# Patient Record
Sex: Female | Born: 1955
Health system: Southern US, Community
[De-identification: ages and names within clinical notes are randomized; demographics above are authoritative.]

## PROBLEM LIST (undated history)

## (undated) DIAGNOSIS — L570 Actinic keratosis: Secondary | ICD-10-CM

## (undated) DIAGNOSIS — Z9889 Other specified postprocedural states: Secondary | ICD-10-CM

## (undated) DIAGNOSIS — Z85828 Personal history of other malignant neoplasm of skin: Secondary | ICD-10-CM

## (undated) DIAGNOSIS — R112 Nausea with vomiting, unspecified: Secondary | ICD-10-CM

## (undated) DIAGNOSIS — K5790 Diverticulosis of intestine, part unspecified, without perforation or abscess without bleeding: Secondary | ICD-10-CM

## (undated) DIAGNOSIS — Z8719 Personal history of other diseases of the digestive system: Secondary | ICD-10-CM

## (undated) DIAGNOSIS — K279 Peptic ulcer, site unspecified, unspecified as acute or chronic, without hemorrhage or perforation: Secondary | ICD-10-CM

## (undated) DIAGNOSIS — G4730 Sleep apnea, unspecified: Secondary | ICD-10-CM

## (undated) DIAGNOSIS — M1990 Unspecified osteoarthritis, unspecified site: Secondary | ICD-10-CM

## (undated) DIAGNOSIS — I10 Essential (primary) hypertension: Secondary | ICD-10-CM

## (undated) DIAGNOSIS — K219 Gastro-esophageal reflux disease without esophagitis: Secondary | ICD-10-CM

## (undated) HISTORY — DX: Actinic keratosis: L57.0

## (undated) HISTORY — DX: Essential (primary) hypertension: I10

## (undated) HISTORY — DX: Diverticulosis of intestine, part unspecified, without perforation or abscess without bleeding: K57.90

## (undated) HISTORY — DX: Peptic ulcer, site unspecified, unspecified as acute or chronic, without hemorrhage or perforation: K27.9

## (undated) HISTORY — DX: Personal history of other malignant neoplasm of skin: Z85.828

## (undated) HISTORY — DX: Personal history of other diseases of the digestive system: Z87.19

## (undated) HISTORY — PX: OTHER SURGICAL HISTORY: SHX169

---
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
## 1989-07-17 DIAGNOSIS — K279 Peptic ulcer, site unspecified, unspecified as acute or chronic, without hemorrhage or perforation: Secondary | ICD-10-CM

## 1989-07-17 HISTORY — DX: Peptic ulcer, site unspecified, unspecified as acute or chronic, without hemorrhage or perforation: K27.9

## 1990-03-17 HISTORY — PX: CHOLECYSTECTOMY: SHX55

## 1997-12-09 ENCOUNTER — Other Ambulatory Visit: Admission: RE | Admit: 1997-12-09 | Discharge: 1997-12-09 | Payer: Self-pay | Admitting: Gynecology

## 1998-12-23 ENCOUNTER — Other Ambulatory Visit: Admission: RE | Admit: 1998-12-23 | Discharge: 1998-12-23 | Payer: Self-pay | Admitting: Gynecology

## 2000-01-23 ENCOUNTER — Other Ambulatory Visit: Admission: RE | Admit: 2000-01-23 | Discharge: 2000-01-23 | Payer: Self-pay | Admitting: Gynecology

## 2001-03-07 ENCOUNTER — Other Ambulatory Visit: Admission: RE | Admit: 2001-03-07 | Discharge: 2001-03-07 | Payer: Self-pay | Admitting: Gynecology

## 2002-06-02 ENCOUNTER — Other Ambulatory Visit: Admission: RE | Admit: 2002-06-02 | Discharge: 2002-06-02 | Payer: Self-pay | Admitting: Gynecology

## 2003-07-20 ENCOUNTER — Other Ambulatory Visit: Admission: RE | Admit: 2003-07-20 | Discharge: 2003-07-20 | Payer: Self-pay | Admitting: Gynecology

## 2004-07-25 ENCOUNTER — Other Ambulatory Visit: Admission: RE | Admit: 2004-07-25 | Discharge: 2004-07-25 | Payer: Self-pay | Admitting: Gynecology

## 2005-10-05 ENCOUNTER — Other Ambulatory Visit: Admission: RE | Admit: 2005-10-05 | Discharge: 2005-10-05 | Payer: Self-pay | Admitting: Gynecology

## 2006-06-13 ENCOUNTER — Ambulatory Visit: Payer: Self-pay

## 2006-11-15 ENCOUNTER — Ambulatory Visit: Payer: Self-pay | Admitting: Gastroenterology

## 2006-11-30 ENCOUNTER — Encounter: Payer: Self-pay | Admitting: Gastroenterology

## 2006-11-30 ENCOUNTER — Ambulatory Visit: Payer: Self-pay | Admitting: Gastroenterology

## 2007-05-07 DIAGNOSIS — C4492 Squamous cell carcinoma of skin, unspecified: Secondary | ICD-10-CM

## 2007-05-07 HISTORY — DX: Squamous cell carcinoma of skin, unspecified: C44.92

## 2008-07-17 HISTORY — PX: PARTIAL COLECTOMY: SHX5273

## 2008-10-16 ENCOUNTER — Encounter: Admission: RE | Admit: 2008-10-16 | Discharge: 2008-10-16 | Payer: Self-pay | Admitting: Obstetrics and Gynecology

## 2008-10-16 ENCOUNTER — Inpatient Hospital Stay (HOSPITAL_COMMUNITY): Admission: EM | Admit: 2008-10-16 | Discharge: 2008-10-21 | Payer: Self-pay | Admitting: Emergency Medicine

## 2008-10-17 ENCOUNTER — Ambulatory Visit: Payer: Self-pay | Admitting: Gastroenterology

## 2008-11-13 ENCOUNTER — Encounter: Payer: Self-pay | Admitting: Gastroenterology

## 2008-11-26 ENCOUNTER — Encounter: Admission: RE | Admit: 2008-11-26 | Discharge: 2008-11-26 | Payer: Self-pay | Admitting: Surgery

## 2008-12-22 ENCOUNTER — Encounter: Admission: RE | Admit: 2008-12-22 | Discharge: 2008-12-22 | Payer: Self-pay | Admitting: Surgery

## 2009-01-28 ENCOUNTER — Encounter: Payer: Self-pay | Admitting: Internal Medicine

## 2009-01-28 DIAGNOSIS — K5730 Diverticulosis of large intestine without perforation or abscess without bleeding: Secondary | ICD-10-CM | POA: Insufficient documentation

## 2009-02-01 ENCOUNTER — Ambulatory Visit: Payer: Self-pay | Admitting: Internal Medicine

## 2009-02-02 ENCOUNTER — Inpatient Hospital Stay (HOSPITAL_COMMUNITY): Admission: RE | Admit: 2009-02-02 | Discharge: 2009-02-06 | Payer: Self-pay | Admitting: Surgery

## 2009-02-02 ENCOUNTER — Encounter (INDEPENDENT_AMBULATORY_CARE_PROVIDER_SITE_OTHER): Payer: Self-pay | Admitting: Surgery

## 2009-09-27 DIAGNOSIS — D239 Other benign neoplasm of skin, unspecified: Secondary | ICD-10-CM

## 2009-09-27 HISTORY — DX: Other benign neoplasm of skin, unspecified: D23.9

## 2010-10-23 LAB — CBC
HCT: 30.5 % — ABNORMAL LOW (ref 36.0–46.0)
HCT: 33.6 % — ABNORMAL LOW (ref 36.0–46.0)
MCHC: 34.6 g/dL (ref 30.0–36.0)
MCV: 87.1 fL (ref 78.0–100.0)
MCV: 88 fL (ref 78.0–100.0)
Platelets: 179 10*3/uL (ref 150–400)
Platelets: 205 10*3/uL (ref 150–400)
Platelets: 208 10*3/uL (ref 150–400)
RBC: 3.5 MIL/uL — ABNORMAL LOW (ref 3.87–5.11)
RBC: 3.82 MIL/uL — ABNORMAL LOW (ref 3.87–5.11)
RDW: 15.1 % (ref 11.5–15.5)
WBC: 4.5 10*3/uL (ref 4.0–10.5)
WBC: 5.3 10*3/uL (ref 4.0–10.5)
WBC: 9 10*3/uL (ref 4.0–10.5)

## 2010-10-23 LAB — BASIC METABOLIC PANEL
BUN: 11 mg/dL (ref 6–23)
BUN: 3 mg/dL — ABNORMAL LOW (ref 6–23)
CO2: 27 mEq/L (ref 19–32)
Calcium: 9.3 mg/dL (ref 8.4–10.5)
Chloride: 102 mEq/L (ref 96–112)
Chloride: 105 mEq/L (ref 96–112)
Creatinine, Ser: 0.67 mg/dL (ref 0.4–1.2)
Creatinine, Ser: 0.69 mg/dL (ref 0.4–1.2)
GFR calc Af Amer: >60 mL/min (ref >60)
GFR calc non Af Amer: >60 mL/min (ref >60)
GFR calc non Af Amer: >60 mL/min (ref >60)
Glucose, Bld: 55 mg/dL — ABNORMAL LOW (ref 70–99)
Potassium: 3.3 mEq/L — ABNORMAL LOW (ref 3.5–5.1)
Potassium: 3.3 mEq/L — ABNORMAL LOW (ref 3.5–5.1)
Potassium: 3.6 mEq/L (ref 3.5–5.1)
Sodium: 135 mEq/L (ref 135–145)

## 2010-10-26 LAB — COMPREHENSIVE METABOLIC PANEL
AST: 23 U/L (ref 0–37)
Albumin: 2.2 g/dL — ABNORMAL LOW (ref 3.5–5.2)
Albumin: 2.8 g/dL — ABNORMAL LOW (ref 3.5–5.2)
Alkaline Phosphatase: 87 U/L (ref 39–117)
BUN: 7 mg/dL (ref 6–23)
BUN: 9 mg/dL (ref 6–23)
CO2: 24 mEq/L (ref 19–32)
Chloride: 105 mEq/L (ref 96–112)
Creatinine, Ser: 0.85 mg/dL (ref 0.4–1.2)
GFR calc Af Amer: >60 mL/min (ref >60)
GFR calc non Af Amer: >60 mL/min (ref >60)
Potassium: 4.1 mEq/L (ref 3.5–5.1)
Total Bilirubin: 0.8 mg/dL (ref 0.3–1.2)
Total Protein: 6.5 g/dL (ref 6.0–8.3)

## 2010-10-26 LAB — CBC
HCT: 29.5 % — ABNORMAL LOW (ref 36.0–46.0)
HCT: 30.6 % — ABNORMAL LOW (ref 36.0–46.0)
HCT: 34.8 % — ABNORMAL LOW (ref 36.0–46.0)
Hemoglobin: 10.4 g/dL — ABNORMAL LOW (ref 12.0–15.0)
MCV: 87.3 fL (ref 78.0–100.0)
MCV: 87.6 fL (ref 78.0–100.0)
Platelets: 363 10*3/uL (ref 150–400)
Platelets: 363 10*3/uL (ref 150–400)
Platelets: 433 10*3/uL — ABNORMAL HIGH (ref 150–400)
RBC: 3.52 MIL/uL — ABNORMAL LOW (ref 3.87–5.11)
RDW: 12.9 % (ref 11.5–15.5)
RDW: 13.3 % (ref 11.5–15.5)
WBC: 10.6 10*3/uL — ABNORMAL HIGH (ref 4.0–10.5)
WBC: 15.8 10*3/uL — ABNORMAL HIGH (ref 4.0–10.5)
WBC: 6 10*3/uL (ref 4.0–10.5)

## 2010-10-26 LAB — DIFFERENTIAL
Basophils Absolute: 0 10*3/uL (ref 0.0–0.1)
Eosinophils Relative: 0 % (ref 0–5)
Lymphocytes Relative: 12 % (ref 12–46)
Lymphs Abs: 1.8 10*3/uL (ref 0.7–4.0)
Monocytes Absolute: 1.2 10*3/uL — ABNORMAL HIGH (ref 0.1–1.0)
Monocytes Relative: 7 % (ref 3–12)
Neutro Abs: 12.8 10*3/uL — ABNORMAL HIGH (ref 1.7–7.7)

## 2010-10-26 LAB — BASIC METABOLIC PANEL
BUN: 4 mg/dL — ABNORMAL LOW (ref 6–23)
Creatinine, Ser: 0.71 mg/dL (ref 0.4–1.2)
GFR calc non Af Amer: >60 mL/min (ref >60)
Glucose, Bld: 126 mg/dL — ABNORMAL HIGH (ref 70–99)
Potassium: 4.2 mEq/L (ref 3.5–5.1)

## 2010-10-26 LAB — URINE CULTURE: Colony Count: NO GROWTH

## 2010-10-26 LAB — CULTURE, ROUTINE-ABSCESS

## 2010-10-26 LAB — PHOSPHORUS: Phosphorus: 3 mg/dL (ref 2.3–4.6)

## 2010-10-26 LAB — APTT: aPTT: 37 seconds (ref 24–37)

## 2010-11-29 NOTE — Consult Note (Signed)
[name], [name]              ACCOUNT NO.:  [id number]   MEDICAL RECORD NO.:  [id number]          PATIENT TYPE:  INP   LOCATION:  1532                         FACILITY:  [facility]   PHYSICIAN:  Barbette Hair. April Dice, MD,FACGDATE OF BIRTH:  08-May-1956   DATE OF CONSULTATION:  10/17/2008  DATE OF DISCHARGE:                                 CONSULTATION   GASTROENTEROLOGY CONSULTATION   REASON FOR CONSULTATION:  Acute diverticulitis.   HISTORY:  [name] is a pleasant, 55 year old, white female  referred by the [facility] for evaluation of diverticulitis.  Over the past 3 weeks, she has been complaining of left lower quadrant  pain.  She was seen by her gynecologist and placed on antibiotics for a  possible uterine infection.  The pain partially subsided.  Because of  persistent pain and fevers, she was given another set of antibiotics and  referred for a CAT scan.  This CT scan, which I reviewed, demonstrated  inflammatory changes in the sigmoid colon and a walled-off small pelvic  abscess.  She continues to have mild pain and fevers.   PAST MEDICAL HISTORY:  Pertinent for hypertension.   FAMILY HISTORY:  Noncontributory.   MEDICATIONS:  She takes Dyazide and Chlor-Trimeton.  SHE IS ALLERGIC TO  VICODIN.   She neither smokes nor drinks.   Review of systems was reviewed and is negative.   PHYSICAL EXAMINATION:  GENERAL:  She is a well-developed, well-nourished  female.  VITAL SIGNS:  Pulse 94, temperature 100.2.  HEENT:  Within normal limits.  NECK:  Supple.  There is no lymphadenopathy.  CHEST:  Clear.  HEART:  There is a 1/6 early systolic murmur at the left sternal border.  ABDOMEN:  There is mild diffuse left and right lower quadrant tenderness  without guarding or rebound.  There is a suggestion of a 2 x 3-cm mass  in the left lower quadrant that is slightly tender.  There is no  organomegaly.  EXTREMITIES:  There is no cyanosis, clubbing, or edema of  the  peripheral extremities.  NEUROLOGIC:  Exam is grossly intact.   White count was initially 15.8, hemoglobin 11.6.   IMPRESSION:  1. Acute diverticulitis complicated by a walled-off pelvic abscess.  2. Hypertension.   DISCUSSION:  The patient appears to have a walled-off abscess as a  complication of her acute diverticulitis.  She does not exhibit  peritoneal signs.   RECOMMENDATIONS:  1. Broad-spectrum antibiotics.  2. Percutaneous drainage of her pelvic abscess.      Barbette Hair. April Dice, MD,FACG  Electronically Signed     RDK/MEDQ  D:  10/17/2008  T:  10/17/2008  Job:  [id number]   cc:   [name], M.D.  Fax: [phone]

## 2010-11-29 NOTE — Discharge Summary (Signed)
NAMESHERECE, GAMBRILL              ACCOUNT NO.:  [id number]   MEDICAL RECORD NO.:  [id number]          PATIENT TYPE:  INP   LOCATION:  1532                         FACILITY:  [facility]   PHYSICIAN:  Elliot Cousin, M.D.    DATE OF BIRTH:  1956-02-25   DATE OF ADMISSION:  10/16/2008  DATE OF DISCHARGE:  10/21/2008                               DISCHARGE SUMMARY   ADDENDUM:  Please see the previous discharge summary dictated by Dr.  Kathryne Hitch.   DISCHARGE DIAGNOSES AND HOSPITAL COURSE:  1. ACUTE DIVERTICULITIS WITH E-COLI ABSCESS.  The patient continued to      improve clinically.  A followup CT scan of the pelvis with contrast      was ordered on October 20, 2008 and it revealed a technically      successful pigtail drainage of the sigmoid diverticular abscess      with no residual un-drained component and some decrease in the      inflammatory/edematous changes around the sigmoid colon.      Interventional radiologist Dr. Deanne Coffer evaluated the patient today.      He decided to remove the pelvic drain as it had been draining less      than 10 mL daily for several days.  The patient's diet was advanced      to a full liquid diet which she has been tolerating well.  The      culture of the abscess grew out E. coli.  However, the E. coli was      resistant to Cipro and, therefore, ciprofloxacin was discontinued.      The patient was continued on Zosyn and Flagyl throughout the      hospitalization.  I obtained a curbside consultation with      infectious diseases physician Dr. Daiva Eves with regards to further      treatment of the infection.  In review of the sensitivities of the      E. coli, it was found that it was sensitive to ceftriaxone.      Therefore a PICC line was inserted prior to the patient's hospital      discharge.  She will be discharged to home on ceftriaxone 2 grams      IV daily for a total of 10 days per the recommendation of Dr. Daiva Eves.  In addition, he recommended  ongoing oral treatment with      Flagyl 500 mg t.i.d. for 10 days as well.  [facility]      has been consulted and will be providing the administration of the      IV antibiotic.  2. The patient is also anemic.  Her hemoglobin at the time of the      initial hospital assessment was 11.6.  However, over the course of      the hospitalization, it decreased to 9.9.  Dr. Jeani Hawking      continued to follow the patient throughout the hospitalization and      he will follow up with her in several weeks.  Perhaps,  the patient      will need to undergo further evaluation with a sigmoidoscopy or      colonoscopy.  This decision will be deferred to Dr. Elnoria Howard.  Dr.      Daphine Deutscher also continued to follow the patient throughout the      hospitalization.  From his standpoint, the patient was stable for      discharge today.  He advised the patient to follow up with him in      approximately 2 weeks.   CONSULTATIONS:  1. Interventional radiologist Dr. Deanne Coffer.  2. General surgeon Dr. Daphine Deutscher.  3. Curbside consultation with infectious diseases physician, Dr. Daiva Eves.   PROCEDURES PERFORMED:  1. CT scan of the pelvis with contrast on October 20, 2008.  The results      are indicated above.  2. Insertion of left upper extremity PICC on October 21, 2008.   DISCHARGE MEDICATIONS:  1. Flagyl 500 mg t.i.d. for 10 more days.  2. Rocephin 2 grams IV daily for 10 more days.      Elliot Cousin, M.D.  Electronically Signed     DF/MEDQ  D:  10/21/2008  T:  10/21/2008  Job:  [id number]   cc:   [place] Daphine Deutscher, MD  [address], [address]  [place]  [place] [address]   Jordan Hawks. Elnoria Howard, MD  Fax: [phone]

## 2010-11-29 NOTE — H&P (Signed)
[name], [name]              ACCOUNT NO.:  [id number]   MEDICAL RECORD NO.:  [id number]          PATIENT TYPE:  INP   LOCATION:  0106                         FACILITY:  [facility]   PHYSICIAN:  Lucita Ferrara, MD         DATE OF BIRTH:  1956/05/13   DATE OF ADMISSION:  10/16/2008  DATE OF DISCHARGE:                              HISTORY & PHYSICAL   HISTORY OF PRESENT ILLNESS:  Patient he is a pleasant 55 year old female  with no significant past medical history who presents with left lower  quadrant abdominal pain that has been progressively getting worse.  Patient saw her OB/GYN, Dr. Duane Lope, who presumptively and empirically  treat her for diverticulitis as an outpatient with antibiotics with no  resolution.  Patient continued to have significant pain, fevers, and  chills.  Documented fevers were not established.  She denied any nausea  or vomiting.  She denies any bloody bowel movements.  She has had a  colonoscopy about 1 year ago for routine colonoscopy after age 32 which  was negative.  She does carry a diagnosis of diverticulosis, however.  She does try to eat a low-residue diet.  She does have a family history  of diverticulitis.  Otherwise, 12-point review of systems has been  reviewed and negative.   SOCIAL HISTORY:  Patient denies drugs, alcohol, or tobacco.   PAST SURGICAL HISTORY:  1. Status post cholecystectomy.  2. Status post tonsillectomy.   PAST MEDICAL HISTORY:  Significant for:  1. Hypertension.  2. Diverticulosis.   HOME MEDICATIONS:  Include:  1. Triamterene/hydrochlorothiazide 75/50 once daily.  2. Flagyl 500 mg 4 times a day.   REVIEW OF SYSTEMS:  As per HPI, otherwise negative.   ALLERGIES:  ALLERGIC TO VICODIN.   PHYSICAL EXAMINATION:  Generally speaking, patient is in no acute  distress.  Blood pressure 122/75.  Pulse 96.  Respirations 18.  Temperature 99.1.  HEENT:  Normocephalic, atraumatic.  Sclerae anicteric.  PERRLA.  Extraocular muscles  intact.  NECK:  Supple.  No JVD.  No carotid bruits.  LUNGS:  Clear to auscultation bilaterally.  No rhonchi, rales, or  wheezes.  ABDOMEN:  Soft.  There is left lower quadrant tenderness.  Positive  bowel sounds.  EXTREMITIES:  No clubbing, cyanosis, or edema.  NEURO:  Patient is alert and oriented x3.  Cranial nerves II-XII grossly  intact.   LABORATORY DATA:  Patient had a complete metabolic panel which shows a  potassium of 2.6, chloride 101, CO2 of 28, BUN is 9, creatinine is 0.85.  CBC shows a white count of 15.8, hemoglobin 11.6, hematocrit 34.8,  platelet count of 433.   RADIOLOGICAL RESULTS:  Show a CT scan of the abdomen and pelvis with  sigmoid diverticulitis with foci of extraperitoneal air and development  of abscess.   ASSESSMENT AND PLAN:  Patient is a 55 year old with;  1. Left lower quadrant abdominal pain secondary to sigmoid      diverticulitis with questionable abscess formation.  2. Hypertension.   DISCUSSION AND PLAN:  Patient will be admitted to medical floor.  Patient  will be initiated on intravenous Zosyn, Flagyl, Cipro.  Blood  cultures.  Urine culture.  We will keep the patient n.p.o. and advance  diet as tolerated.  Low-residue diet eventually.  Patient will  eventually need a colonoscopy once patient has cooled off.  GI versus  surgical consultation versus both as needed.  DVT and GI prophylaxis  with SCD boots and Protonix.  The rest of the plans will depend on her  progress and consult recommendations.      Lucita Ferrara, MD  Electronically Signed     RR/MEDQ  D:  10/16/2008  T:  10/16/2008  Job:  [id number]

## 2010-11-29 NOTE — Discharge Summary (Signed)
NAMEKEVIONNA, HEFFLER              ACCOUNT NO.:  [id number]   MEDICAL RECORD NO.:  [id number]          PATIENT TYPE:  INP   LOCATION:  1532                         FACILITY:  [facility]   PHYSICIAN:  Monte Fantasia, MD  DATE OF BIRTH:  03/02/1956   DATE OF ADMISSION:  10/16/2008  DATE OF DISCHARGE:                               DISCHARGE SUMMARY   PRIMARY CARE PHYSICIAN:  Unassigned.   DISCHARGE DIAGNOSES:  1. Diverticular abscess.  2. Diverticulitis.  3. Hypertension.   DISCHARGE MEDICATIONS:  Will be dictated at the time of final discharge.   COURSE DURING THE HOSPITAL STAY:  Ms. Shambaugh, a 55 year old, pleasant,  Caucasian, lady patient, was admitted on October 16, 2008, with complaints  of left lower quadrant abdominal pain progressively getting worse.  Patient was seen by her OB/GYN, Dr. Tenny Craw, and was preemptively treated  for diverticulitis as an outpatient, however, with no resolution.  Patient continued to have pain and fever with chills and hence came on  to the ED.  Patient did undergo a CT scan of the abdomen and pelvis soft  with contrast which showed sigmoid diverticulitis with focus of  extraperitoneal air and developing abscess as described.  Patient was  started on ciprofloxacin and Flagyl with Zosyn since admission.  Patient  has resolved well with her abdominal pain and leukocytosis.  Also,  surgical consult was sought for with Dr. Daphine Deutscher and as per his  evaluation, had an IR for percutaneous drainage of the abscess.  As per  discussions with Dr. Daphine Deutscher, would plan for removing the percutaneous  drain in a.m. on October 21, 2008, and would need to contact IR for the  same.  If the drain is removed, we would observe the patient for next 24  hours and if patient does clinically well patient can be planned for  discharge.  Patient needs to be discharged on p.o. antibiotics and needs  to follow up with Dr. Daphine Deutscher as an outpatient in next 1 to 2 weeks.  Also, patient needs  to be on full liquids to low-residual diet until  seen by Dr. Daphine Deutscher as an outpatient.  Patient was also seen by  gastroenterologist during the stay in the hospital.  As per the  recommendations, continued IV antibiotics for the same and needs to  follow up with Dr. Arlyce Dice as an outpatient for her gastroesophageal  reflux disease.   RADIOLOGICAL INVESTIGATIONS DONE DURING THE STAY IN THE HOSPITAL:  1. CT abdomen and pelvis with contrast done on October 16, 2008.  CT of      the abdomen impression:  No evidence of acute abnormality within      the abdomen.  CT pelvis impression:  Sigmoid diverticulitis with      foci of extraperitoneal air and developing abscess as described.  2. CT-guided abscess drainage done on October 18, 2008.   LABS DONE INTERIM DURING THE STAY IN THE HOSPITAL:  Total WBC 6.0,  improved from 15.8, hemoglobin 9.9, hematocrit 29.5, and platelets of  363.  Sodium 136, potassium 4.2, chloride 107, bicarb 22, glucose 126,  BUN 4, creatinine  0.7, calcium of 8.  Culture of the abscess, gram-  positive cocci in chains.  Urine cultures have been no growth.   DISPOSITION:  Patient at present has a percutaneous drain to her left  side.  Would need an IR followup for possible drain removal in a.m.  Patient also needs to follow up with Dr. Daphine Deutscher as an outpatient for the  diverticular abscess status post drainage.  Patient needs to continue on  full liquids to low-residual diet as per surgery recommendations.  Patient also needs to follow up with GI for her GERD-like symptoms.  We  will discharge the patient on PPIs for 6 to 8 weeks.   EXAMINATION:  VITALS:  Temperature of 98.5.  Pulse of 64.  Respirations  16.  Blood pressure 115/78.  Oxygen saturation 96% room air.  HEENT EXAMINATION:  Neck is supple.  Pupils equal, reacting to light.  No pallor.  No lymphadenopathy.  RESPIRATORY EXAMINATION:  Air entry is bilaterally equal.  No rales.  No  rhonchi.  CARDIOVASCULAR EXAMINATION:   S1 and S2.  Regular rate and rhythm.  ABDOMEN:  Soft.  No guarding.  No rigidity.  No tenderness.  Percutaneous drain plus on the left side.  No distention.  No tenderness  at the site.  EXTREMITIES:  No edema of feet.  CNS:  Patient is alert, awake, oriented x3.  No focal neurological  deficits.      Monte Fantasia, MD  Electronically Signed     MP/MEDQ  D:  10/20/2008  T:  10/20/2008  Job:  [id number]

## 2010-11-29 NOTE — Op Note (Signed)
[name]              ACCOUNT NO.:  [id number]   MEDICAL RECORD NO.:  [id number]          PATIENT TYPE:  INP   LOCATION:  1533                         FACILITY:  [facility]   PHYSICIAN:  Thornton Park. Daphine Deutscher, MD  DATE OF BIRTH:  Dec 23, 1955   DATE OF PROCEDURE:  02/02/2009  DATE OF DISCHARGE:                               OPERATIVE REPORT   PREOPERATIVE DIAGNOSIS:  Recent diverticulitis with evidence of  diverticular stricture in the distal sigmoid colon.   POSTOPERATIVE DIAGNOSIS:  Severe subacute diverticulitis with stricture.   PROCEDURE:  Laparoscopically-assisted sigmoid colectomy and low anterior  resection with mobilization of the splenic flexure, rigid sigmoidoscopy  to assess anastomosis, anastomosis created with a 29 Stealth Ethicon  stapler in a [employer] fashion side to the end.   SURGEON:  Luretha Murphy, M.D.   ASSISTANT:  Cicero Duck, M.D., with Ovidio Kin, M.D., assisting in  the beginning of the procedure.   ANESTHESIA:  General.   DESCRIPTION OF PROCEDURE:  [name] was taken to room 1 on the  afternoon of Tuesday, February 02, 2009, for what proved to be a 3-hour  case.  I entered using a 5-mm OptiView in the right upper quadrant after  she was prepped and draped sterilely and placed in the dorsal lithotomy  position in yellow-fin stirrups.  Entering the abdomen without  difficulty, I placed 2 other port sites with a 5-mm variety on the right  side and one on the left, and through those and using the Harmonic  scalpel, I performed a complete mobilization of her splenic flexure and  carried this down along the left sidewall.  Down in the pelvis, the  colon was intensely adherent to the left sidewall, and I stayed on that  and was able to get that mostly mobilized.  I  carried this down into  the pelvis and used blunt dissection with the sucker to free this from  the uterus but found that the right ovary was really stuck down to it,  but I did get  good length with the splenic flexure mobilization and had  done initial mobilization of the colon as much as I could  laparoscopically.  I then made a 9-cm lower midline incision and used  the XCell wound protector and began further mobilization which was quite  difficult even open using manual identification.  I stayed around the  colon and did not venture into the region of the ureters.  I divided the  mesentery using the Harmonic scalpel as well as the Covidien coagulation  and divider.  I carried this down deep in the pelvis to the reflection  where I was able to divide it with a contour stapler.  The specimen was  removed after being marked.   I then came in from below dilating with a 29 dilator and was able to  establish I could get up there with a stapler.  I used the 29 Ethicon  EEA and brought in the anvil coming out of the sidewall and stapling off  the proximal colon.  I used a spike to do that  and then removed the  spike.  The stapling device was brought in from below, engaged and  snapped and closed, and this was a new version that allows several  gradations, so it was a thicker portion of colon, so I fired it with the  upper portion making for larger staples.  This fired, giving [place] 2  complete rings.  I did a colon rigid sigmoidoscopic exam and visualized  the rings, insufflated under clamped conditions under water, and no  bubbles were seen.  A 19 Blake drain was placed in the pelvis and  secured to the skin with 3-0 nylon.  The wounds were irrigated.  Everything appeared to be in order.  No bleeding was noted.  Sponge and  needle counts were  correct.  I then closed with 2-0 Vicryl on the peritoneum and  interrupted #1 Novofil.  The wound was irrigated and closed with  staples.  The patient tolerated the procedure well and was taken to  recovery room in satisfactory condition.      Thornton Park Daphine Deutscher, MD  Electronically Signed     MBM/MEDQ  D:  02/02/2009  T:   02/03/2009  Job:  [id number]   cc:   Hedwig Morton. Juanda Chance, MD  [address]  [place]  [place] [address]

## 2010-12-02 NOTE — Discharge Summary (Signed)
NAMECHARLIENE, [name]              ACCOUNT NO.:  [id number]   MEDICAL RECORD NO.:  [id number]          PATIENT TYPE:  INP   LOCATION:  1533                         FACILITY:  [facility]   PHYSICIAN:  Thornton Park. Daphine Deutscher, MD  DATE OF BIRTH:  22-Nov-1955   DATE OF ADMISSION:  02/02/2009  DATE OF DISCHARGE:  02/06/2009                               DISCHARGE SUMMARY   ADMITTING DIAGNOSIS:  Diverticular stricture.   DISCHARGE DIAGNOSIS:  Severe subacute diverticulitis.   PROCEDURE:  February 02, 2009 - lap assisted low anterior resection with  mobilization of the splenic flexure sigmoidoscopy.   COURSE IN THE HOSPITAL:  This 55 year old lady underwent the above  mentioned operations.  She did well.  She had a JP placed which just  drained some serosanguineous material.  She got along well and was ready  for discharge on February 06, 2009, which was postoperative day #4.   CONDITION:  Improved.   FINAL DIAGNOSIS:  Severe sigmoid diverticulitis status post resection  and primary anastomosis.      Thornton Park Daphine Deutscher, MD  Electronically Signed     MBM/MEDQ  D:  02/22/2009  T:  02/22/2009  Job:  [id number]

## 2010-12-02 NOTE — H&P (Signed)
[name], [name]              ACCOUNT NO.:  [id number]   MEDICAL RECORD NO.:  [id number]          PATIENT TYPE:  INP   LOCATION:  1533                         [facility]ACILITY:  [facility]   PHYSICIAN:  Thornton Park. Daphine Deutscher, MD  DATE OF BIRTH:  [birth year]   DATE OF ADMISSION:  02/02/2009  DATE OF DISCHARGE:  02/06/2009                              HISTORY & PHYSICAL   CHIEF COMPLAINT:  Lower abdominal pain and diverticulitis.   HISTORY:  [name] is a 55 year old white female who was admitted  back in April with a diverticular abscess requiring percutaneous  drainage.  She was seen by me and followed during that time and although  she got over that, she continued to have some diarrhea and some cramping  lower abdominal pain.  We discussed management options and after being  on antibiotics for several weeks she elected to go ahead and have a  resection.   PAST MEDICAL HISTORY:  1. Previous cholecystectomy and T and A.  2. History is also positive for hypertension.  3. Diverticulitis.   HOME MEDICATIONS:  1. Include triamterene.  2. Hydrochlorothiazide once daily.   ALLERGIES:  LISTED TO VICODIN.   PHYSICAL EXAM:  Well-developed, well-nourished white female in no acute  distress.  Blood pressure 120/80, pulse rate 80, afebrile.  HEENT:  Exam unremarkable.  NECK:  Supple.  CHEST:  Clear.  HEART:  Sinus rhythm without murmurs, rub s or gallops.  ABDOMEN:  Tender in the lower abdomen but not severely so.  EXTREMITIES:  Full range of motion.  NEURO:  Alert, oriented x3.  Motor and sensory function grossly intact.   IMPRESSION:  Ongoing diverticulitis status post drainage of diverticular  abscess.   PLAN:  Admit and do a laparoscopically-assisted low anterior resection.      Thornton Park Daphine Deutscher, MD  Electronically Signed    MBM/MEDQ  D:  03/16/2009  T:  03/16/2009  Job:  [id number]

## 2011-02-06 ENCOUNTER — Other Ambulatory Visit: Payer: Self-pay | Admitting: Gynecology

## 2011-10-02 ENCOUNTER — Encounter: Payer: Self-pay | Admitting: Gastroenterology

## 2011-12-21 ENCOUNTER — Encounter: Payer: Self-pay | Admitting: Gastroenterology

## 2012-02-14 ENCOUNTER — Encounter: Payer: Self-pay | Admitting: Gastroenterology

## 2012-02-14 ENCOUNTER — Ambulatory Visit (AMBULATORY_SURGERY_CENTER): Payer: BC Managed Care – PPO

## 2012-02-14 VITALS — Ht 64.0 in | Wt 155.6 lb

## 2012-02-14 DIAGNOSIS — Z860100 Personal history of colon polyps, unspecified: Secondary | ICD-10-CM

## 2012-02-14 MED ORDER — MOVIPREP 100 G PO SOLR: 1.0000 | Freq: Once | ORAL | Status: DC

## 2012-02-15 ENCOUNTER — Other Ambulatory Visit: Payer: Self-pay

## 2012-02-27 ENCOUNTER — Encounter: Payer: Self-pay | Admitting: Gastroenterology

## 2012-02-27 ENCOUNTER — Ambulatory Visit (AMBULATORY_SURGERY_CENTER): Payer: BC Managed Care – PPO | Admitting: Gastroenterology

## 2012-02-27 VITALS — BP 123/78 | HR 60 | Temp 97.7°F | Resp 14 | Ht 64.0 in | Wt 155.0 lb

## 2012-02-27 DIAGNOSIS — K5730 Diverticulosis of large intestine without perforation or abscess without bleeding: Secondary | ICD-10-CM

## 2012-02-27 DIAGNOSIS — Z86010 Personal history of colonic polyps: Secondary | ICD-10-CM

## 2012-02-27 DIAGNOSIS — R933 Abnormal findings on diagnostic imaging of other parts of digestive tract: Secondary | ICD-10-CM

## 2012-02-27 MED ORDER — SODIUM CHLORIDE 0.9 % IV SOLN: 500.0000 mL | INTRAVENOUS | Status: DC

## 2012-02-27 NOTE — Progress Notes (Signed)
Patient did not experience any of the following events: a burn prior to discharge; a fall within the facility; wrong site/side/patient/procedure/implant event; or a hospital transfer or hospital admission upon discharge from the facility. (G8907) Patient did not have preoperative order for IV antibiotic SSI prophylaxis. (G8918)  

## 2012-02-27 NOTE — Op Note (Signed)
[facility] [address] [place], [place]  [address]  COLONOSCOPY PROCEDURE REPORT  PATIENT:  [name], [name]  MR#:  [id number] BIRTHDATE:  04/06/1956, 55 yrs. old  GENDER:  female ENDOSCOPIST:  Rachael Fee, MD PROCEDURE DATE:  02/27/2012 PROCEDURE:  Colonoscopy 19147 ASA CLASS:  Class II INDICATIONS:  adenomatous polyp removed 2008, underwent sigmoid resection for diverticular stricture in 2010 MEDICATIONS:   Fentanyl 50 mcg IV, These medications were titrated to patient response per physician's verbal order, Versed 3 mg IV  DESCRIPTION OF PROCEDURE:   After the risks benefits and alternatives of the procedure were thoroughly explained, informed consent was obtained.  Digital rectal exam was performed and revealed no rectal masses.   The LB CF-[id number] endoscope was introduced through the anus and advanced to the cecum, which was identified by both the appendix and ileocecal valve, without limitations.  The quality of the prep was good..  The instrument was then slowly withdrawn as the colon was fully examined. <<PROCEDUREIMAGES>> FINDINGS:  Sigmoid anastomosis noted, normal appearing. There were still numerous diverticulum in remaining left colon (see image1 and image5).  This was otherwise a normal examination of the colon (see image6, image3, and image2).   Retroflexed views in the rectum revealed no abnormalities. COMPLICATIONS:  None  ENDOSCOPIC IMPRESSION: 1) Anastomosis from elective sigmoid resection in 2010 was normal appearing 2) Otherwise normal examination; no polyps or cancers  RECOMMENDATIONS: 1) Given your personal history of adenomatous (pre-cancerous) polyps, you will need a repeat colonoscopy in 5 years.  REPEAT EXAM:  5 years  ______________________________ Rachael Fee, MD  n. eSIGNED:   Rachael Fee at 02/27/2012 09:47 AM  Lacie Scotts, [phone]

## 2012-02-27 NOTE — Patient Instructions (Addendum)

## 2012-02-27 NOTE — Progress Notes (Signed)
Physician informed nurse that pt. Needed a new pt. Office visit to discuss reflux issues. Patty made aware and appt. Brought upstairs and given to pt. For 03/29/12 @ 10:30.

## 2012-02-28 ENCOUNTER — Telehealth: Payer: Self-pay | Admitting: *Deleted

## 2012-02-28 NOTE — Telephone Encounter (Signed)
  Follow up Call-  Call back number 02/27/2012  Post procedure Call Back phone  # [phone]  Permission to leave phone message Yes     Left message on answering machine to call back if experiencing any problems or has questions

## 2012-03-29 ENCOUNTER — Ambulatory Visit: Payer: BC Managed Care – PPO | Admitting: Gastroenterology

## 2012-04-08 ENCOUNTER — Ambulatory Visit (INDEPENDENT_AMBULATORY_CARE_PROVIDER_SITE_OTHER): Payer: BC Managed Care – PPO | Admitting: Gastroenterology

## 2012-04-08 ENCOUNTER — Encounter: Payer: Self-pay | Admitting: Gastroenterology

## 2012-04-08 VITALS — BP 112/76 | HR 76 | Ht 63.75 in | Wt 158.0 lb

## 2012-04-08 DIAGNOSIS — K219 Gastro-esophageal reflux disease without esophagitis: Secondary | ICD-10-CM

## 2012-04-08 DIAGNOSIS — R109 Unspecified abdominal pain: Secondary | ICD-10-CM

## 2012-04-08 NOTE — Progress Notes (Signed)
Review of pertinent gastrointestinal problems: 1. adenomatous colon polyp removed 2008. Repeat colonoscopy 2013 found no recurrent polyps, recommended repeat examination at 5 year interval. 2. diverticulosis, complicated. Underwent sigmoid segmental resection 2010   HPI: This is a    very pleasant [name] whom I last saw about a month ago at the time of an outpatient colonoscopy for polyp surveillance. She is here today to discuss a different problem.  Progressively worse pyrosis.  She knows she has to eat 3 hours before laying down.  Takes 5 tums day.  Sometimes more.  Took an OTC antiacid med for 14 days and it helped.  As soon as she was off, her pyrosis symptoms returned.  Has been on nexium in the past but for insurance reason  No dysphagia.  No weight loss.  Symptoms have been for 1-2 years.  No overt UGI bleeding.  Has been gaining weight in past 2-3 years.  Drinks 16-20 oz coffee in am.  Not a daily etoh drinker, rare caffeine, rare peppermints.  She feels she has a "peptic ulcer" has abdominal pains periodically and was told she had an ulcer.  This was long ago, based on UGI fluoro exam in 1991.    Pretty rare NSAIDs.    Review of systems: Pertinent positive and negative review of systems were noted in the above HPI section. Complete review of systems was performed and was otherwise normal.    Past Medical History  Diagnosis Date  . Hypertension   . Diverticulosis   . Peptic ulcer disease 1991    Past Surgical History  Procedure Date  . Tonsillectomy 1966  . Cholecystectomy 991  . Partial colectomy 2010    Current Outpatient Prescriptions  Medication Sig Dispense Refill  . triamterene-hydrochlorothiazide (MAXZIDE) 75-50 MG per tablet Take 1 tablet by mouth daily.        Allergies as of 04/08/2012 - Review Complete 04/08/2012  Allergen Reaction Noted  . Vicodin (hydrocodone-acetaminophen) Itching 02/14/2012    Family History  Problem Relation Age of  Onset  . Colon cancer Neg Hx   . Diabetes Brother     History   Social History  . Marital Status: Married    Spouse Name: N/A    Number of Children: 2  . Years of Education: N/A   Occupational History  . retired-education    Social History Main Topics  . Smoking status: Former Smoker    Types: Cigarettes    Quit date: 02/13/1974  . Smokeless tobacco: Never Used  . Alcohol Use: 1.2 oz/week    2 Cans of beer per week  . Drug Use: No  . Sexually Active: Not on file   Other Topics Concern  . Not on file   Social History Narrative  . No narrative on file       Physical Exam: Ht 5' 3.75" (1.619 m)  Wt 158 lb (71.668 kg)  BMI 27.33 kg/m2 Constitutional: generally well-appearing Psychiatric: alert and oriented x3 Eyes: extraocular movements intact Mouth: oral pharynx moist, no lesions Neck: supple no lymphadenopathy Cardiovascular: heart regular rate and rhythm Lungs: clear to auscultation bilaterally Abdomen: soft, nontender, nondistended, no obvious ascites, no peritoneal signs, normal bowel sounds Extremities: no lower extremity edema bilaterally Skin: no lesions on visible extremities    Assessment and plan: April y.o. female with  history of peptic ulcer based on fluoroscopic exam, intermittent abdominal pains, chronic pyrosis  She takes 5-6 times a day. I think she clearly has an issue with  acid reflux and I have asked her to start taking once daily over-the-counter proton pump inhibitor 20-30 minutes prior to her dinner meal. We'll proceed with EGD given her chronic GERD symptoms as well as this question of chronic peptic ulcer.

## 2012-04-08 NOTE — Patient Instructions (Addendum)
You will be set up for an upper endoscopy for GERD, intermittent pains, ? Ulcer.  (LEC, moderate sedation). Start OTC prilosec, prevacid or their generic equivalent.  One pill once daily, 20-30 min before dinner meal.

## 2012-04-23 ENCOUNTER — Encounter: Payer: Self-pay | Admitting: Gastroenterology

## 2012-04-23 ENCOUNTER — Ambulatory Visit (AMBULATORY_SURGERY_CENTER): Payer: BC Managed Care – PPO | Admitting: Gastroenterology

## 2012-04-23 VITALS — BP 119/68 | HR 59 | Temp 98.9°F | Resp 15 | Ht 63.0 in | Wt 158.0 lb

## 2012-04-23 DIAGNOSIS — K2970 Gastritis, unspecified, without bleeding: Secondary | ICD-10-CM

## 2012-04-23 DIAGNOSIS — K219 Gastro-esophageal reflux disease without esophagitis: Secondary | ICD-10-CM

## 2012-04-23 DIAGNOSIS — R0989 Other specified symptoms and signs involving the circulatory and respiratory systems: Secondary | ICD-10-CM

## 2012-04-23 DIAGNOSIS — K319 Disease of stomach and duodenum, unspecified: Secondary | ICD-10-CM

## 2012-04-23 MED ORDER — SODIUM CHLORIDE 0.9 % IV SOLN: 500.0000 mL | INTRAVENOUS | Status: DC

## 2012-04-23 NOTE — Op Note (Signed)
[facility] [address] [place], [address]   ENDOSCOPY PROCEDURE REPORT  PATIENT: [name], [name]  MR#: [id number] BIRTHDATE: 09/06/1955 , 56  yrs. old GENDER: Female ENDOSCOPIST: Rachael Fee, MD PROCEDURE DATE:  04/23/2012 PROCEDURE:  EGD w/ biopsy ASA CLASS:     Class II INDICATIONS:  GERD, dyspepsia. MEDICATIONS: Fentanyl 50 mcg IV, Versed 6 mg IV, and These medications were titrated to patient response per physician's verbal order TOPICAL ANESTHETIC: Cetacaine Spray  DESCRIPTION OF PROCEDURE: After the risks benefits and alternatives of the procedure were thoroughly explained, informed consent was obtained.  The LB-GIF Q180 [id number] endoscope was introduced through the mouth and advanced to the second portion of the duodenum. Without limitations.  The instrument was slowly withdrawn as the mucosa was fully examined.  There was mild, non-specific gastritis distally.  Biopsies taken and sent to pathology.  There was a 4-5cm hiatal hernia with resultant forshortening of esophagus, becoming distally tortuous.  The examination was otherwise normal.  Retroflexed views revealed no abnormalities.     The scope was then withdrawn from the patient and the procedure completed. COMPLICATIONS: There were no complications.  ENDOSCOPIC IMPRESSION: There was mild gastritis.  Biopsied. There was a 4-5cm hiatal hernia. The examination was otherwise normal.  RECOMMENDATIONS: If biopsies show H.  pylori, you will be started on appropriate antibiotics.  You should continue daily PPI (omeprazole) which is best taken 20-30 min prior to a meal.  Can increase to twice daily PPI if needed. Dr.  Christella Hartigan' office will call to schedule return visit in office to discuss response to meds.    eSigned:  Rachael Fee, MD 04/23/2012 3:18 PM

## 2012-04-23 NOTE — Progress Notes (Signed)
Patient did not experience any of the following events: a burn prior to discharge; a fall within the facility; wrong site/side/patient/procedure/implant event; or a hospital transfer or hospital admission upon discharge from the facility. (G8907) Patient did not have preoperative order for IV antibiotic SSI prophylaxis. (G8918)  

## 2012-04-23 NOTE — Patient Instructions (Addendum)

## 2012-04-23 NOTE — Progress Notes (Signed)
The pt tolerated the egd very well. Maw   

## 2012-04-24 ENCOUNTER — Telehealth: Payer: Self-pay | Admitting: *Deleted

## 2012-04-24 NOTE — Telephone Encounter (Signed)
NO ANSWER, MESSAGE LEFT FOR THE PATIENT. 

## 2012-05-07 ENCOUNTER — Encounter: Payer: Self-pay | Admitting: Gastroenterology

## 2012-05-21 ENCOUNTER — Ambulatory Visit (INDEPENDENT_AMBULATORY_CARE_PROVIDER_SITE_OTHER): Payer: BC Managed Care – PPO | Admitting: Gastroenterology

## 2012-05-21 ENCOUNTER — Encounter: Payer: Self-pay | Admitting: Gastroenterology

## 2012-05-21 VITALS — BP 114/74 | HR 88 | Ht 63.75 in | Wt 159.0 lb

## 2012-05-21 DIAGNOSIS — K219 Gastro-esophageal reflux disease without esophagitis: Secondary | ICD-10-CM | POA: Insufficient documentation

## 2012-05-21 NOTE — Progress Notes (Signed)
Review of pertinent gastrointestinal problems:  1. adenomatous colon polyp removed 2008. Repeat colonoscopy 2013 found no recurrent polyps, recommended repeat examination at 5 year interval.  2. diverticulosis, complicated. Underwent sigmoid segmental resection 2010 3. H. Pylori negative mild gastritis on EGD 04/2012; 4cm HH noted    HPI: This is a    very pleasant 56 year old woman whom I last saw the time of upper endoscopy.  She feels well on periodic proton pump inhibitor and also periodic H2 blocker     Past Medical History  Diagnosis Date  . Hypertension   . Diverticulosis   . Peptic ulcer disease 1991  . History of gallstones     Past Surgical History  Procedure Date  . Tonsillectomy 1966  . Cholecystectomy 991  . Partial colectomy 2010    Current Outpatient Prescriptions  Medication Sig Dispense Refill  . omeprazole (PRILOSEC OTC) 20 MG tablet Take 20 mg by mouth daily.      Marland Kitchen triamterene-hydrochlorothiazide (MAXZIDE) 75-50 MG per tablet Take 1 tablet by mouth daily.        Allergies as of 05/21/2012 - Review Complete 05/21/2012  Allergen Reaction Noted  . Vicodin (hydrocodone-acetaminophen) Itching 02/14/2012    Family History  Problem Relation Age of Onset  . Colon cancer Neg Hx   . Colon polyps Neg Hx   . Rectal cancer Neg Hx   . Stomach cancer Neg Hx   . Diabetes Brother     History   Social History  . Marital Status: Married    Spouse Name: N/A    Number of Children: 2  . Years of Education: N/A   Occupational History  . retired-education    Social History Main Topics  . Smoking status: Former Smoker    Types: Cigarettes    Quit date: 02/13/1974  . Smokeless tobacco: Never Used  . Alcohol Use: 1.2 oz/week    2 Cans of beer per week  . Drug Use: No  . Sexually Active: Not on file   Other Topics Concern  . Not on file   Social History Narrative  . No narrative on file      Physical Exam: BP 114/74  Pulse 88  Ht 5' 3.75"  (1.619 m)  Wt 159 lb (72.122 kg)  BMI 27.51 kg/m2 Constitutional: generally well-appearing Psychiatric: alert and oriented x3 Abdomen: soft, nontender, nondistended, no obvious ascites, no peritoneal signs, normal bowel sounds     Assessment and plan: 56 y.o. female with intermittent GERD  She'll continue on as needed H2 blocker indefinitely. This seems to help her symptoms quite well which are usually nighttime symptoms. She does call should any further questions or concerns.

## 2012-05-21 NOTE — Patient Instructions (Addendum)
Continue pepcid, zantac periodically at bedtime or even every night. Call Dr. Christella Hartigan with other GI concerns.

## 2012-06-18 DIAGNOSIS — Z85828 Personal history of other malignant neoplasm of skin: Secondary | ICD-10-CM

## 2012-06-18 HISTORY — DX: Personal history of other malignant neoplasm of skin: Z85.828

## 2013-02-20 ENCOUNTER — Other Ambulatory Visit: Payer: Self-pay | Admitting: Gynecology

## 2013-09-22 ENCOUNTER — Encounter (INDEPENDENT_AMBULATORY_CARE_PROVIDER_SITE_OTHER): Payer: BC Managed Care – PPO | Admitting: Ophthalmology

## 2013-09-22 DIAGNOSIS — H35039 Hypertensive retinopathy, unspecified eye: Secondary | ICD-10-CM

## 2013-09-22 DIAGNOSIS — I10 Essential (primary) hypertension: Secondary | ICD-10-CM

## 2013-09-22 DIAGNOSIS — H35349 Macular cyst, hole, or pseudohole, unspecified eye: Secondary | ICD-10-CM

## 2013-09-22 DIAGNOSIS — H33309 Unspecified retinal break, unspecified eye: Secondary | ICD-10-CM

## 2013-09-22 DIAGNOSIS — H43819 Vitreous degeneration, unspecified eye: Secondary | ICD-10-CM

## 2013-09-29 ENCOUNTER — Ambulatory Visit (INDEPENDENT_AMBULATORY_CARE_PROVIDER_SITE_OTHER): Payer: BC Managed Care – PPO | Admitting: Ophthalmology

## 2013-10-13 ENCOUNTER — Encounter (INDEPENDENT_AMBULATORY_CARE_PROVIDER_SITE_OTHER): Payer: BC Managed Care – PPO | Admitting: Ophthalmology

## 2013-10-13 DIAGNOSIS — H33309 Unspecified retinal break, unspecified eye: Secondary | ICD-10-CM

## 2013-10-21 DIAGNOSIS — H35342 Macular cyst, hole, or pseudohole, left eye: Secondary | ICD-10-CM | POA: Insufficient documentation

## 2013-11-04 ENCOUNTER — Ambulatory Visit (HOSPITAL_COMMUNITY): Admit: 2013-11-04 | Payer: BC Managed Care – PPO | Admitting: Ophthalmology

## 2013-11-04 ENCOUNTER — Encounter (HOSPITAL_COMMUNITY): Payer: Self-pay

## 2013-11-04 ENCOUNTER — Encounter (INDEPENDENT_AMBULATORY_CARE_PROVIDER_SITE_OTHER): Payer: BC Managed Care – PPO | Admitting: Ophthalmology

## 2013-11-04 SURGERY — 25 GAUGE PARS PLANA VITRECTOMY WITH 20 GAUGE MVR PORT FOR MACULAR HOLE
Anesthesia: General | Laterality: Left

## 2014-03-09 ENCOUNTER — Other Ambulatory Visit: Payer: Self-pay | Admitting: Gynecology

## 2014-03-10 LAB — CYTOLOGY - PAP

## 2014-03-17 ENCOUNTER — Encounter: Payer: Self-pay | Admitting: Internal Medicine

## 2014-03-17 ENCOUNTER — Encounter: Payer: Self-pay | Admitting: Gastroenterology

## 2015-02-22 ENCOUNTER — Other Ambulatory Visit: Payer: Self-pay | Admitting: Family Medicine

## 2015-02-22 DIAGNOSIS — R1900 Intra-abdominal and pelvic swelling, mass and lump, unspecified site: Secondary | ICD-10-CM

## 2015-02-24 ENCOUNTER — Ambulatory Visit
Admission: RE | Admit: 2015-02-24 | Discharge: 2015-02-24 | Disposition: A | Discharge status: Home / Self Care | Payer: BC Managed Care – PPO | Source: Ambulatory Visit | Attending: Family Medicine | Admitting: Family Medicine

## 2015-02-24 ENCOUNTER — Other Ambulatory Visit: Payer: Self-pay | Admitting: Family Medicine

## 2015-02-24 DIAGNOSIS — R1900 Intra-abdominal and pelvic swelling, mass and lump, unspecified site: Secondary | ICD-10-CM

## 2016-02-16 ENCOUNTER — Ambulatory Visit: Payer: Self-pay | Admitting: Surgery

## 2016-02-21 ENCOUNTER — Other Ambulatory Visit (HOSPITAL_COMMUNITY): Payer: Self-pay | Admitting: Surgery

## 2016-02-21 DIAGNOSIS — K449 Diaphragmatic hernia without obstruction or gangrene: Secondary | ICD-10-CM

## 2016-02-21 DIAGNOSIS — K219 Gastro-esophageal reflux disease without esophagitis: Secondary | ICD-10-CM

## 2016-02-23 ENCOUNTER — Other Ambulatory Visit (HOSPITAL_COMMUNITY): Payer: Self-pay | Admitting: Surgery

## 2016-02-23 ENCOUNTER — Ambulatory Visit (HOSPITAL_COMMUNITY)
Admission: RE | Admit: 2016-02-23 | Discharge: 2016-02-23 | Disposition: A | Discharge status: Home / Self Care | Payer: BC Managed Care – PPO | Source: Ambulatory Visit | Attending: Surgery | Admitting: Surgery

## 2016-02-23 DIAGNOSIS — K449 Diaphragmatic hernia without obstruction or gangrene: Secondary | ICD-10-CM

## 2016-02-23 DIAGNOSIS — K219 Gastro-esophageal reflux disease without esophagitis: Secondary | ICD-10-CM | POA: Insufficient documentation

## 2016-03-03 ENCOUNTER — Other Ambulatory Visit (HOSPITAL_COMMUNITY): Payer: Self-pay | Admitting: Surgery

## 2016-03-03 DIAGNOSIS — K219 Gastro-esophageal reflux disease without esophagitis: Secondary | ICD-10-CM

## 2016-03-07 ENCOUNTER — Ambulatory Visit (HOSPITAL_COMMUNITY): Payer: BC Managed Care – PPO

## 2016-03-10 ENCOUNTER — Encounter (HOSPITAL_COMMUNITY)
Admission: RE | Disposition: A | Discharge status: Home / Self Care | Payer: Self-pay | Source: Ambulatory Visit | Attending: Gastroenterology

## 2016-03-10 ENCOUNTER — Ambulatory Visit (HOSPITAL_COMMUNITY)
Admission: RE | Admit: 2016-03-10 | Discharge: 2016-03-10 | Disposition: A | Discharge status: Home / Self Care | Payer: BC Managed Care – PPO | Source: Ambulatory Visit | Attending: Gastroenterology | Admitting: Gastroenterology

## 2016-03-10 DIAGNOSIS — K219 Gastro-esophageal reflux disease without esophagitis: Secondary | ICD-10-CM

## 2016-03-10 DIAGNOSIS — K449 Diaphragmatic hernia without obstruction or gangrene: Secondary | ICD-10-CM

## 2016-03-10 HISTORY — PX: ESOPHAGEAL MANOMETRY: SHX5429

## 2016-03-10 SURGERY — MANOMETRY, ESOPHAGUS
Anesthesia: Choice

## 2016-03-10 MED ORDER — LIDOCAINE VISCOUS 2 % MT SOLN
OROMUCOSAL | Status: AC
  Filled 2016-03-10: qty 15

## 2016-03-10 SURGICAL SUPPLY — 2 items
FACESHIELD LNG OPTICON STERILE (SAFETY) IMPLANT
GLOVE BIO SURGEON STRL SZ8 (GLOVE) ×4 IMPLANT

## 2016-03-10 NOTE — Progress Notes (Signed)
Esophageal Manometry done per protocol. Pt tolerated well without complication.   Report to be sent to Dr. Nandigam. 

## 2016-03-13 ENCOUNTER — Encounter (HOSPITAL_COMMUNITY): Payer: Self-pay | Admitting: Gastroenterology

## 2016-03-16 DIAGNOSIS — K449 Diaphragmatic hernia without obstruction or gangrene: Secondary | ICD-10-CM

## 2016-04-05 ENCOUNTER — Other Ambulatory Visit: Payer: Self-pay | Admitting: Family Medicine

## 2016-04-05 ENCOUNTER — Other Ambulatory Visit (HOSPITAL_COMMUNITY)
Admission: RE | Admit: 2016-04-05 | Discharge: 2016-04-05 | Disposition: A | Discharge status: Home / Self Care | Payer: BC Managed Care – PPO | Source: Ambulatory Visit | Attending: Family Medicine | Admitting: Family Medicine

## 2016-04-05 DIAGNOSIS — Z01419 Encounter for gynecological examination (general) (routine) without abnormal findings: Secondary | ICD-10-CM | POA: Insufficient documentation

## 2016-04-05 DIAGNOSIS — Z1151 Encounter for screening for human papillomavirus (HPV): Secondary | ICD-10-CM | POA: Insufficient documentation

## 2016-04-10 LAB — CYTOLOGY - PAP

## 2016-04-25 ENCOUNTER — Other Ambulatory Visit: Payer: Self-pay | Admitting: Family Medicine

## 2016-04-25 DIAGNOSIS — Z1231 Encounter for screening mammogram for malignant neoplasm of breast: Secondary | ICD-10-CM

## 2016-05-03 ENCOUNTER — Ambulatory Visit
Admission: RE | Admit: 2016-05-03 | Discharge: 2016-05-03 | Disposition: A | Discharge status: Home / Self Care | Payer: BC Managed Care – PPO | Source: Ambulatory Visit | Attending: Family Medicine | Admitting: Family Medicine

## 2016-05-03 DIAGNOSIS — Z1231 Encounter for screening mammogram for malignant neoplasm of breast: Secondary | ICD-10-CM

## 2016-05-18 ENCOUNTER — Encounter (HOSPITAL_COMMUNITY): Payer: Self-pay | Admitting: *Deleted

## 2016-05-18 ENCOUNTER — Ambulatory Visit: Payer: Self-pay | Admitting: Surgery

## 2016-05-22 ENCOUNTER — Encounter (HOSPITAL_COMMUNITY)
Admission: RE | Admit: 2016-05-22 | Discharge: 2016-05-22 | Disposition: A | Discharge status: Home / Self Care | Payer: BC Managed Care – PPO | Source: Ambulatory Visit | Attending: Surgery | Admitting: Surgery

## 2016-05-22 ENCOUNTER — Encounter (HOSPITAL_COMMUNITY): Payer: Self-pay

## 2016-05-22 DIAGNOSIS — K219 Gastro-esophageal reflux disease without esophagitis: Secondary | ICD-10-CM | POA: Diagnosis not present

## 2016-05-22 DIAGNOSIS — Z01812 Encounter for preprocedural laboratory examination: Secondary | ICD-10-CM

## 2016-05-22 DIAGNOSIS — K648 Other hemorrhoids: Secondary | ICD-10-CM | POA: Diagnosis not present

## 2016-05-22 DIAGNOSIS — K644 Residual hemorrhoidal skin tags: Secondary | ICD-10-CM | POA: Diagnosis not present

## 2016-05-22 DIAGNOSIS — Z9049 Acquired absence of other specified parts of digestive tract: Secondary | ICD-10-CM | POA: Diagnosis not present

## 2016-05-22 DIAGNOSIS — K649 Unspecified hemorrhoids: Secondary | ICD-10-CM | POA: Diagnosis not present

## 2016-05-22 DIAGNOSIS — I10 Essential (primary) hypertension: Secondary | ICD-10-CM | POA: Diagnosis not present

## 2016-05-22 DIAGNOSIS — Z01810 Encounter for preprocedural cardiovascular examination: Secondary | ICD-10-CM

## 2016-05-22 DIAGNOSIS — K449 Diaphragmatic hernia without obstruction or gangrene: Secondary | ICD-10-CM | POA: Diagnosis not present

## 2016-05-22 DIAGNOSIS — Z87891 Personal history of nicotine dependence: Secondary | ICD-10-CM | POA: Diagnosis not present

## 2016-05-22 HISTORY — DX: Nausea with vomiting, unspecified: Z98.890

## 2016-05-22 HISTORY — DX: Other specified postprocedural states: R11.2

## 2016-05-22 HISTORY — DX: Gastro-esophageal reflux disease without esophagitis: K21.9

## 2016-05-22 HISTORY — DX: Personal history of other diseases of the digestive system: Z87.19

## 2016-05-22 LAB — BASIC METABOLIC PANEL
ANION GAP: 7 (ref 5–15)
BUN: 15 mg/dL (ref 6–20)
CALCIUM: 9.3 mg/dL (ref 8.9–10.3)
CO2: 28 mmol/L (ref 22–32)
Chloride: 105 mmol/L (ref 101–111)
Creatinine, Ser: 0.81 mg/dL (ref 0.44–1.00)
GLUCOSE: 74 mg/dL (ref 65–99)
POTASSIUM: 3.5 mmol/L (ref 3.5–5.1)
Sodium: 140 mmol/L (ref 135–145)

## 2016-05-22 LAB — CBC
HEMATOCRIT: 38.2 % (ref 36.0–46.0)
HEMOGLOBIN: 12.4 g/dL (ref 12.0–15.0)
MCH: 26.4 pg (ref 26.0–34.0)
MCHC: 32.5 g/dL (ref 30.0–36.0)
MCV: 81.3 fL (ref 78.0–100.0)
Platelets: 289 10*3/uL (ref 150–400)
RBC: 4.7 MIL/uL (ref 3.87–5.11)
RDW: 14.2 % (ref 11.5–15.5)
WBC: 6.7 10*3/uL (ref 4.0–10.5)

## 2016-05-22 NOTE — Progress Notes (Signed)
[name] done 05/22/16- EPIC

## 2016-05-22 NOTE — Patient Instructions (Signed)
[name]  05/22/2016   Your procedure is scheduled on:   Report to [facility] Main  Entrance take [facility]  elevators to 3rd floor to  [place] at AM.  Call this number if you have problems the morning of surgery [phone]   Remember: ONLY 1 PERSON MAY GO WITH YOU TO SHORT STAY TO GET  READY MORNING OF [place].  Do not eat food or drink liquids :After Midnight.     Take these medicines the morning of surgery with A SIP OF WATER:  DO NOT TAKE ANY DIABETIC MEDICATIONS DAY OF YOUR SURGERY                               You may not have any metal on your body including hair pins and              piercings  Do not wear jewelry, make-up, lotions, powders or perfumes, deodorant             Do not wear nail polish.  Do not shave  48 hours prior to surgery.              Men may shave face and neck.   Do not bring valuables to the hospital. [place].  Contacts, dentures or bridgework may not be worn into surgery.  Leave suitcase in the car. After surgery it may be brought to your room.     Patients discharged the day of surgery will not be allowed to drive home.  Name and phone number of your driver:  Special Instructions: N/A              Please read over the following fact sheets you were given: _____________________________________________________________________             [facility] - Preparing for Surgery Before surgery, you can play an important role.  Because skin is not sterile, your skin needs to be as free of germs as possible.  You can reduce the number of germs on your skin by washing with CHG (chlorahexidine gluconate) soap before surgery.  CHG is an antiseptic cleaner which kills germs and bonds with the skin to continue killing germs even after washing. Please DO NOT use if you have an allergy to CHG or antibacterial soaps.  If your skin becomes reddened/irritated stop using  the CHG and inform your nurse when you arrive at Short Stay. Do not shave (including legs and underarms) for at least 48 hours prior to the first CHG shower.  You may shave your face/neck. Please follow these instructions carefully:  1.  Shower with CHG Soap the night before surgery and the  morning of Surgery.  2.  If you choose to wash your hair, wash your hair first as usual with your  normal  shampoo.  3.  After you shampoo, rinse your hair and body thoroughly to remove the  shampoo.                           4.  Use CHG as you would any other liquid soap.  You can apply chg directly  to the skin and wash  Gently with a scrungie or clean washcloth.  5.  Apply the CHG Soap to your body ONLY FROM THE NECK DOWN.   Do not use on face/ open                           Wound or open sores. Avoid contact with eyes, ears mouth and genitals (private parts).                       Wash face,  Genitals (private parts) with your normal soap.             6.  Wash thoroughly, paying special attention to the area where your surgery  will be performed.  7.  Thoroughly rinse your body with warm water from the neck down.  8.  DO NOT shower/wash with your normal soap after using and rinsing off  the CHG Soap.                9.  Pat yourself dry with a clean towel.            10.  Wear clean pajamas.            11.  Place clean sheets on your bed the night of your first shower and do not  sleep with pets. Day of Surgery : Do not apply any lotions/deodorants the morning of surgery.  Please wear clean clothes to the hospital/surgery center.  FAILURE TO FOLLOW THESE INSTRUCTIONS MAY RESULT IN THE CANCELLATION OF YOUR SURGERY PATIENT SIGNATURE_________________________________  NURSE SIGNATURE__________________________________  ________________________________________________________________________

## 2016-05-22 NOTE — Progress Notes (Addendum)
Patient  In today for preop appointment.  Blood pressures as follows: right arm- 150/98 recheck was 142/100.  Blood pressure in left arm- 141/100 then recheck was 141/100 then waited 5 minutes with lights out and did another ercheck- blood pressure was 140/91.  Instructed patient to go to ER if any symptoms of dizziness or lightheadednes.  Patient aware I called Dr Nancy Fetter office to report.  Patient reports at office visit in 03/2016 at [facility] blood pressure was elevated.  Patient did report she took am blood pressure medication.  Spoke with Triage Nurse at office of Dr Nancy Fetter and made them aware of above and made them aware having hiatal hernia repair and hemorrhoidectomy on 05/24/2016.

## 2016-05-22 NOTE — Progress Notes (Signed)
Office of Dr Nancy Fetter- PCP called back after patient left preop appointment and stated they would be calling patient regarding blood pressure issues.

## 2016-05-24 ENCOUNTER — Inpatient Hospital Stay (HOSPITAL_COMMUNITY): Payer: BC Managed Care – PPO | Admitting: Anesthesiology

## 2016-05-24 ENCOUNTER — Observation Stay (HOSPITAL_COMMUNITY)
Admission: RE | Admit: 2016-05-24 | Discharge: 2016-05-26 | DRG: 328 | Disposition: A | Discharge status: Home / Self Care | Payer: BC Managed Care – PPO | Source: Ambulatory Visit | Attending: Surgery | Admitting: Surgery

## 2016-05-24 ENCOUNTER — Encounter (HOSPITAL_COMMUNITY)
Admission: RE | Disposition: A | Discharge status: Home / Self Care | Payer: Self-pay | Source: Ambulatory Visit | Attending: Surgery

## 2016-05-24 ENCOUNTER — Encounter (HOSPITAL_COMMUNITY): Payer: Self-pay

## 2016-05-24 DIAGNOSIS — Z9889 Other specified postprocedural states: Secondary | ICD-10-CM

## 2016-05-24 DIAGNOSIS — K219 Gastro-esophageal reflux disease without esophagitis: Secondary | ICD-10-CM | POA: Diagnosis not present

## 2016-05-24 DIAGNOSIS — K649 Unspecified hemorrhoids: Secondary | ICD-10-CM | POA: Insufficient documentation

## 2016-05-24 DIAGNOSIS — K449 Diaphragmatic hernia without obstruction or gangrene: Secondary | ICD-10-CM | POA: Insufficient documentation

## 2016-05-24 DIAGNOSIS — K644 Residual hemorrhoidal skin tags: Secondary | ICD-10-CM | POA: Insufficient documentation

## 2016-05-24 DIAGNOSIS — I10 Essential (primary) hypertension: Secondary | ICD-10-CM | POA: Insufficient documentation

## 2016-05-24 DIAGNOSIS — Z9049 Acquired absence of other specified parts of digestive tract: Secondary | ICD-10-CM | POA: Insufficient documentation

## 2016-05-24 DIAGNOSIS — Z87891 Personal history of nicotine dependence: Secondary | ICD-10-CM | POA: Insufficient documentation

## 2016-05-24 HISTORY — PX: HEMORRHOID SURGERY: SHX153

## 2016-05-24 LAB — CREATININE, SERUM
Creatinine, Ser: 0.96 mg/dL (ref 0.44–1.00)
GFR calc non Af Amer: >60 mL/min (ref >60)

## 2016-05-24 LAB — CBC
HCT: 36.4 % (ref 36.0–46.0)
Hemoglobin: 11.9 g/dL — ABNORMAL LOW (ref 12.0–15.0)
MCH: 26.8 pg (ref 26.0–34.0)
MCHC: 32.7 g/dL (ref 30.0–36.0)
MCV: 82 fL (ref 78.0–100.0)
PLATELETS: 221 10*3/uL (ref 150–400)
RBC: 4.44 MIL/uL (ref 3.87–5.11)
RDW: 14.5 % (ref 11.5–15.5)
WBC: 12.1 10*3/uL — AB (ref 4.0–10.5)

## 2016-05-24 SURGERY — FUNDOPLICATION, NISSEN, ROBOT-ASSISTED, LAPAROSCOPIC
Anesthesia: General

## 2016-05-24 MED ORDER — BUPIVACAINE LIPOSOME 1.3 % IJ SUSP
20.0000 mL | Freq: Once | INTRAMUSCULAR | Status: AC
  Administered 2016-05-24: 20 mL
  Filled 2016-05-24: qty 20

## 2016-05-24 MED ORDER — FENTANYL CITRATE (PF) 100 MCG/2ML IJ SOLN: 12.5000 ug | INTRAMUSCULAR | Status: DC | PRN

## 2016-05-24 MED ORDER — ATROPINE SULFATE 1 MG/10ML IJ SOSY
PREFILLED_SYRINGE | INTRAMUSCULAR | Status: AC
  Filled 2016-05-24: qty 10

## 2016-05-24 MED ORDER — PROPOFOL 10 MG/ML IV BOLUS
INTRAVENOUS | Status: AC
Start: 2016-05-24 — End: 2016-05-24
  Filled 2016-05-24: qty 20

## 2016-05-24 MED ORDER — MEPERIDINE HCL 50 MG/ML IJ SOLN: 6.2500 mg | INTRAMUSCULAR | Status: DC | PRN

## 2016-05-24 MED ORDER — CHLORHEXIDINE GLUCONATE CLOTH 2 % EX PADS: 6.0000 | MEDICATED_PAD | Freq: Once | CUTANEOUS | Status: DC

## 2016-05-24 MED ORDER — SODIUM CHLORIDE 0.9 % IJ SOLN
INTRAMUSCULAR | Status: AC
  Filled 2016-05-24: qty 50

## 2016-05-24 MED ORDER — PROPOFOL 10 MG/ML IV BOLUS
INTRAVENOUS | Status: DC | PRN
  Administered 2016-05-24: 120 mg via INTRAVENOUS

## 2016-05-24 MED ORDER — BUPIVACAINE HCL (PF) 0.5 % IJ SOLN
INTRAMUSCULAR | Status: AC
  Filled 2016-05-24: qty 30

## 2016-05-24 MED ORDER — PHENYLEPHRINE 40 MCG/ML (10ML) SYRINGE FOR IV PUSH (FOR BLOOD PRESSURE SUPPORT)
PREFILLED_SYRINGE | INTRAVENOUS | Status: AC
  Filled 2016-05-24: qty 10

## 2016-05-24 MED ORDER — SUFENTANIL CITRATE 50 MCG/ML IV SOLN
INTRAVENOUS | Status: AC
  Filled 2016-05-24: qty 1

## 2016-05-24 MED ORDER — SUGAMMADEX SODIUM 200 MG/2ML IV SOLN
INTRAVENOUS | Status: DC | PRN
  Administered 2016-05-24: 200 mg via INTRAVENOUS

## 2016-05-24 MED ORDER — LIDOCAINE 2% (20 MG/ML) 5 ML SYRINGE
INTRAMUSCULAR | Status: DC | PRN
  Administered 2016-05-24: 100 mg via INTRAVENOUS

## 2016-05-24 MED ORDER — ACETAMINOPHEN 500 MG PO TABS
1000.0000 mg | ORAL_TABLET | ORAL | Status: AC
  Administered 2016-05-24: 1000 mg via ORAL
  Filled 2016-05-24: qty 2

## 2016-05-24 MED ORDER — SODIUM CHLORIDE 0.9 % IJ SOLN
INTRAMUSCULAR | Status: DC | PRN
  Administered 2016-05-24: 30 mL

## 2016-05-24 MED ORDER — ROCURONIUM BROMIDE 50 MG/5ML IV SOSY
PREFILLED_SYRINGE | INTRAVENOUS | Status: AC
  Filled 2016-05-24: qty 5

## 2016-05-24 MED ORDER — HEPARIN SODIUM (PORCINE) 5000 UNIT/ML IJ SOLN
5000.0000 [IU] | Freq: Once | INTRAMUSCULAR | Status: AC
  Administered 2016-05-24: 5000 [IU] via SUBCUTANEOUS
  Filled 2016-05-24: qty 1

## 2016-05-24 MED ORDER — HEPARIN SODIUM (PORCINE) 5000 UNIT/ML IJ SOLN
5000.0000 [IU] | Freq: Three times a day (TID) | INTRAMUSCULAR | Status: DC
  Administered 2016-05-25 – 2016-05-26 (×4): 5000 [IU] via SUBCUTANEOUS
  Filled 2016-05-24 (×3): qty 1

## 2016-05-24 MED ORDER — CEFAZOLIN SODIUM-DEXTROSE 2-4 GM/100ML-% IV SOLN
INTRAVENOUS | Status: AC
  Filled 2016-05-24: qty 100

## 2016-05-24 MED ORDER — CEFAZOLIN SODIUM-DEXTROSE 2-4 GM/100ML-% IV SOLN
2.0000 g | Freq: Three times a day (TID) | INTRAVENOUS | Status: AC
  Administered 2016-05-24: 2 g via INTRAVENOUS
  Filled 2016-05-24: qty 100

## 2016-05-24 MED ORDER — ONDANSETRON HCL 4 MG/2ML IJ SOLN: 4.0000 mg | Freq: Four times a day (QID) | INTRAMUSCULAR | Status: DC | PRN

## 2016-05-24 MED ORDER — MIDAZOLAM HCL 2 MG/2ML IJ SOLN
INTRAMUSCULAR | Status: DC | PRN
  Administered 2016-05-24: 2 mg via INTRAVENOUS

## 2016-05-24 MED ORDER — 0.9 % SODIUM CHLORIDE (POUR BTL) OPTIME
TOPICAL | Status: DC | PRN
  Administered 2016-05-24: 1000 mL

## 2016-05-24 MED ORDER — SODIUM CHLORIDE 0.9 % IJ SOLN
INTRAMUSCULAR | Status: AC
  Filled 2016-05-24: qty 10

## 2016-05-24 MED ORDER — DEXAMETHASONE SODIUM PHOSPHATE 10 MG/ML IJ SOLN
INTRAMUSCULAR | Status: AC
  Filled 2016-05-24: qty 1

## 2016-05-24 MED ORDER — MIDAZOLAM HCL 2 MG/2ML IJ SOLN: 0.5000 mg | Freq: Once | INTRAMUSCULAR | Status: DC | PRN

## 2016-05-24 MED ORDER — CEFAZOLIN SODIUM-DEXTROSE 2-4 GM/100ML-% IV SOLN
2.0000 g | INTRAVENOUS | Status: AC
  Administered 2016-05-24 (×2): 2 g via INTRAVENOUS
  Filled 2016-05-24: qty 100

## 2016-05-24 MED ORDER — LACTATED RINGERS IR SOLN
Status: DC | PRN
  Administered 2016-05-24: 1000 mL

## 2016-05-24 MED ORDER — POVIDONE-IODINE 10 % EX OINT
TOPICAL_OINTMENT | CUTANEOUS | Status: DC | PRN
  Administered 2016-05-24: 1 via TOPICAL

## 2016-05-24 MED ORDER — HYDROMORPHONE HCL 1 MG/ML IJ SOLN: 0.2500 mg | INTRAMUSCULAR | Status: DC | PRN

## 2016-05-24 MED ORDER — ATROPINE SULFATE 1 MG/ML IJ SOLN
INTRAMUSCULAR | Status: DC | PRN
  Administered 2016-05-24: 0.4 mg via INTRAVENOUS

## 2016-05-24 MED ORDER — INFLUENZA VAC SPLIT QUAD 0.5 ML IM SUSY
0.5000 mL | PREFILLED_SYRINGE | INTRAMUSCULAR | Status: DC
  Filled 2016-05-24 (×2): qty 0.5

## 2016-05-24 MED ORDER — ONDANSETRON HCL 4 MG/2ML IJ SOLN
INTRAMUSCULAR | Status: AC
  Filled 2016-05-24: qty 2

## 2016-05-24 MED ORDER — SUFENTANIL CITRATE 50 MCG/ML IV SOLN
INTRAVENOUS | Status: DC | PRN
  Administered 2016-05-24: 20 ug via INTRAVENOUS
  Administered 2016-05-24 (×3): 10 ug via INTRAVENOUS

## 2016-05-24 MED ORDER — KCL IN DEXTROSE-NACL 20-5-0.45 MEQ/L-%-% IV SOLN
INTRAVENOUS | Status: DC
  Administered 2016-05-24 – 2016-05-26 (×4): via INTRAVENOUS
  Filled 2016-05-24 (×5): qty 1000

## 2016-05-24 MED ORDER — ONDANSETRON HCL 4 MG/2ML IJ SOLN
INTRAMUSCULAR | Status: DC | PRN
  Administered 2016-05-24: 4 mg via INTRAVENOUS

## 2016-05-24 MED ORDER — MORPHINE SULFATE 10 MG/ML IJ SOLN
INTRAMUSCULAR | Status: DC | PRN
  Administered 2016-05-24 (×4): 1 mg via INTRAVENOUS

## 2016-05-24 MED ORDER — GABAPENTIN 300 MG PO CAPS
300.0000 mg | ORAL_CAPSULE | ORAL | Status: AC
  Administered 2016-05-24: 300 mg via ORAL
  Filled 2016-05-24: qty 1

## 2016-05-24 MED ORDER — LACTATED RINGERS IV SOLN
INTRAVENOUS | Status: DC
  Administered 2016-05-24 (×2): via INTRAVENOUS

## 2016-05-24 MED ORDER — MORPHINE SULFATE (PF) 4 MG/ML IV SOLN
INTRAVENOUS | Status: AC
  Filled 2016-05-24: qty 1

## 2016-05-24 MED ORDER — LIP MEDEX EX OINT
TOPICAL_OINTMENT | CUTANEOUS | Status: AC
  Administered 2016-05-24: 13:00:00
  Filled 2016-05-24: qty 7

## 2016-05-24 MED ORDER — SUGAMMADEX SODIUM 200 MG/2ML IV SOLN
INTRAVENOUS | Status: AC
  Filled 2016-05-24: qty 2

## 2016-05-24 MED ORDER — ROCURONIUM BROMIDE 10 MG/ML (PF) SYRINGE
PREFILLED_SYRINGE | INTRAVENOUS | Status: DC | PRN
  Administered 2016-05-24 (×2): 10 mg via INTRAVENOUS
  Administered 2016-05-24: 50 mg via INTRAVENOUS
  Administered 2016-05-24 (×2): 20 mg via INTRAVENOUS

## 2016-05-24 MED ORDER — DEXAMETHASONE SODIUM PHOSPHATE 10 MG/ML IJ SOLN
INTRAMUSCULAR | Status: DC | PRN
  Administered 2016-05-24: 10 mg via INTRAVENOUS

## 2016-05-24 MED ORDER — LIDOCAINE 2% (20 MG/ML) 5 ML SYRINGE
INTRAMUSCULAR | Status: AC
  Filled 2016-05-24: qty 5

## 2016-05-24 MED ORDER — CELECOXIB 200 MG PO CAPS
400.0000 mg | ORAL_CAPSULE | ORAL | Status: AC
  Administered 2016-05-24: 400 mg via ORAL
  Filled 2016-05-24: qty 2

## 2016-05-24 MED ORDER — POVIDONE-IODINE 10 % EX OINT
TOPICAL_OINTMENT | CUTANEOUS | Status: AC
  Filled 2016-05-24: qty 28.35

## 2016-05-24 MED ORDER — FAMOTIDINE IN NACL 20-0.9 MG/50ML-% IV SOLN
20.0000 mg | Freq: Two times a day (BID) | INTRAVENOUS | Status: DC
  Administered 2016-05-24 – 2016-05-25 (×3): 20 mg via INTRAVENOUS
  Filled 2016-05-24 (×3): qty 50

## 2016-05-24 MED ORDER — PROMETHAZINE HCL 25 MG/ML IJ SOLN: 6.2500 mg | INTRAMUSCULAR | Status: DC | PRN

## 2016-05-24 MED ORDER — SODIUM CHLORIDE 0.9 % IJ SOLN
INTRAMUSCULAR | Status: AC
Start: 2016-05-24 — End: 2016-05-24
  Filled 2016-05-24: qty 10

## 2016-05-24 MED ORDER — ONDANSETRON 4 MG PO TBDP: 4.0000 mg | ORAL_TABLET | Freq: Four times a day (QID) | ORAL | Status: DC | PRN

## 2016-05-24 MED ORDER — MIDAZOLAM HCL 2 MG/2ML IJ SOLN
INTRAMUSCULAR | Status: AC
  Filled 2016-05-24: qty 2

## 2016-05-24 MED ORDER — PHENYLEPHRINE HCL 10 MG/ML IJ SOLN
INTRAMUSCULAR | Status: DC | PRN
  Administered 2016-05-24 (×5): 80 ug via INTRAVENOUS

## 2016-05-24 SURGICAL SUPPLY — 81 items
APPLIER CLIP 5 13 M/L LIGAMAX5 (MISCELLANEOUS)
APPLIER CLIP ROT 13.4 12 LRG (CLIP)
BLADE HEX COATED 2.75 (ELECTRODE) ×2 IMPLANT
BLADE SURG 15 STRL LF DISP TIS (BLADE) ×1 IMPLANT
BLADE SURG 15 STRL SS (BLADE) ×1
BRIEF STRETCH FOR OB PAD LRG (UNDERPADS AND DIAPERS) ×2 IMPLANT
CLIP APPLIE 5 13 M/L LIGAMAX5 (MISCELLANEOUS) IMPLANT
CLIP APPLIE ROT 13.4 12 LRG (CLIP) IMPLANT
CLIP LIGATING HEM O LOK PURPLE (MISCELLANEOUS) IMPLANT
COVER SURGICAL LIGHT HANDLE (MISCELLANEOUS) IMPLANT
COVER TIP SHEARS 8 DVNC (MISCELLANEOUS) ×1 IMPLANT
COVER TIP SHEARS 8MM DA VINCI (MISCELLANEOUS) ×1
DECANTER SPIKE VIAL GLASS SM (MISCELLANEOUS) ×2 IMPLANT
DERMABOND ADVANCED (GAUZE/BANDAGES/DRESSINGS) ×1
DERMABOND ADVANCED .7 DNX12 (GAUZE/BANDAGES/DRESSINGS) ×1 IMPLANT
DEVICE SUT QUICK LOAD TK 5 (STAPLE) ×4 IMPLANT
DEVICE SUT TI-KNOT TK 5X26 (MISCELLANEOUS) ×2 IMPLANT
DEVICE SUTURE ENDOST 10MM (ENDOMECHANICALS) ×10 IMPLANT
DEVICE TROCAR PUNCTURE CLOSURE (ENDOMECHANICALS) IMPLANT
DRAIN PENROSE 18X1/2 LTX STRL (DRAIN) ×2 IMPLANT
DRAPE ARM DVNC X/XI (DISPOSABLE) ×4 IMPLANT
DRAPE COLUMN DVNC XI (DISPOSABLE) ×1 IMPLANT
DRAPE DA VINCI XI ARM (DISPOSABLE) ×4
DRAPE DA VINCI XI COLUMN (DISPOSABLE) ×1
DRAPE SHEET LG 3/4 BI-LAMINATE (DRAPES) IMPLANT
DRSG PAD ABDOMINAL 8X10 ST (GAUZE/BANDAGES/DRESSINGS) IMPLANT
ELECT PENCIL ROCKER SW 15FT (MISCELLANEOUS) ×2 IMPLANT
ELECT REM PT RETURN 15FT ADLT (MISCELLANEOUS) ×2 IMPLANT
ELECT REM PT RETURN 9FT ADLT (ELECTROSURGICAL) ×2
ELECTRODE REM PT RTRN 9FT ADLT (ELECTROSURGICAL) ×1 IMPLANT
ENDOLOOP SUT PDS II  0 18 (SUTURE)
ENDOLOOP SUT PDS II 0 18 (SUTURE) IMPLANT
GAUZE SPONGE 4X4 12PLY STRL (GAUZE/BANDAGES/DRESSINGS) ×2 IMPLANT
GAUZE SPONGE 4X4 16PLY XRAY LF (GAUZE/BANDAGES/DRESSINGS) ×2 IMPLANT
GLOVE BIOGEL M 8.0 STRL (GLOVE) ×4 IMPLANT
GOWN SPEC L4 XLG W/TWL (GOWN DISPOSABLE) ×2 IMPLANT
GOWN STRL REUS W/TWL XL LVL3 (GOWN DISPOSABLE) ×8 IMPLANT
GRASPER ENDO BABCOCK 10 (MISCELLANEOUS) ×1 IMPLANT
GRASPER ENDO BABCOCK 10MM (MISCELLANEOUS) ×1
IRRIG SUCT STRYKERFLOW 2 WTIP (MISCELLANEOUS) ×2
IRRIGATION SUCT STRKRFLW 2 WTP (MISCELLANEOUS) ×1 IMPLANT
KIT BASIN OR (CUSTOM PROCEDURE TRAY) ×2 IMPLANT
LUBRICANT JELLY K Y 4OZ (MISCELLANEOUS) ×2 IMPLANT
MARKER SKIN DUAL TIP RULER LAB (MISCELLANEOUS) ×2 IMPLANT
NEEDLE HYPO 22GX1.5 SAFETY (NEEDLE) ×2 IMPLANT
PACK CARDIOVASCULAR III (CUSTOM PROCEDURE TRAY) ×2 IMPLANT
PACK LITHOTOMY IV (CUSTOM PROCEDURE TRAY) ×2 IMPLANT
PAD POSITIONING PINK XL (MISCELLANEOUS) ×2 IMPLANT
SCISSORS LAP 5X45 EPIX DISP (ENDOMECHANICALS) ×2 IMPLANT
SEAL CANN UNIV 5-8 DVNC XI (MISCELLANEOUS) ×4 IMPLANT
SEAL XI 5MM-8MM UNIVERSAL (MISCELLANEOUS) ×4
SEALER VESSEL DA VINCI XI (MISCELLANEOUS) ×1
SEALER VESSEL EXT DVNC XI (MISCELLANEOUS) ×1 IMPLANT
SET BI-LUMEN FLTR TB AIRSEAL (TUBING) ×2 IMPLANT
SHEARS HARMONIC 9CM CVD (BLADE) IMPLANT
SHEARS HARMONIC ACE PLUS 36CM (ENDOMECHANICALS) ×2 IMPLANT
SOLUTION ANTI FOG 6CC (MISCELLANEOUS) ×2 IMPLANT
SOLUTION ELECTROLUBE (MISCELLANEOUS) ×2 IMPLANT
SPONGE HEMORRHOID 8X3CM (HEMOSTASIS) IMPLANT
SUT CHROMIC 2 0 SH (SUTURE) IMPLANT
SUT CHROMIC 3 0 SH 27 (SUTURE) IMPLANT
SUT ETHIBOND 0 36 GRN (SUTURE) IMPLANT
SUT ETHIBOND 2 0 SH (SUTURE)
SUT ETHIBOND 2 0 SH 36X2 (SUTURE) IMPLANT
SUT SURGIDAC NAB ES-9 0 48 120 (SUTURE) ×18 IMPLANT
SUT VIC AB 4-0 SH 18 (SUTURE) IMPLANT
SYR 20CC LL (SYRINGE) ×2 IMPLANT
TIP INNERVISION DETACH 40FR (MISCELLANEOUS) IMPLANT
TIP INNERVISION DETACH 50FR (MISCELLANEOUS) IMPLANT
TIP INNERVISION DETACH 56FR (MISCELLANEOUS) ×2 IMPLANT
TIPS INNERVISION DETACH 40FR (MISCELLANEOUS)
TOWEL OR 17X26 10 PK STRL BLUE (TOWEL DISPOSABLE) ×2 IMPLANT
TOWEL OR NON WOVEN STRL DISP B (DISPOSABLE) ×2 IMPLANT
TRAY FOLEY W/METER SILVER 16FR (SET/KITS/TRAYS/PACK) IMPLANT
TROCAR ADV FIXATION 12X100MM (TROCAR) ×2 IMPLANT
TROCAR ADV FIXATION 5X100MM (TROCAR) IMPLANT
TROCAR BLADELESS OPT 5 100 (ENDOMECHANICALS) ×2 IMPLANT
TUBING CONNECTING 10 (TUBING) IMPLANT
TUBING ENDO SMARTCAP PENTAX (MISCELLANEOUS) IMPLANT
UNDERPAD 30X30 INCONTINENT (UNDERPADS AND DIAPERS) ×2 IMPLANT
YANKAUER SUCT BULB TIP 10FT TU (MISCELLANEOUS) ×2 IMPLANT

## 2016-05-24 NOTE — Anesthesia Preprocedure Evaluation (Addendum)
Anesthesia Evaluation  Patient identified by MRN, date of birth, ID band Patient awake    Reviewed: Allergy & Precautions, NPO status , Patient's Chart, lab work & pertinent test results  History of Anesthesia Complications Negative for: history of anesthetic complications  Airway Mallampati: II  TM Distance: >3 FB Neck ROM: Full    Dental  (+) Dental Advisory Given   Pulmonary former smoker (quit 1975),    breath sounds clear to auscultation       Cardiovascular hypertension, Pt. on medications (-) angina Rhythm:Regular Rate:Normal     Neuro/Psych negative neurological ROS     GI/Hepatic Neg liver ROS, PUD, GERD  Medicated and Controlled,  Endo/Other  negative endocrine ROS  Renal/GU negative Renal ROS     Musculoskeletal   Abdominal   Peds  Hematology negative hematology ROS (+)   Anesthesia Other Findings   Reproductive/Obstetrics                            Anesthesia Physical Anesthesia Plan  ASA: II  Anesthesia Plan: General   Post-op Pain Management:    Induction: Intravenous  Airway Management Planned: Oral ETT  Additional Equipment:   Intra-op Plan:   Post-operative Plan: Extubation in OR  Informed Consent: I have reviewed the patients History and Physical, chart, labs and discussed the procedure including the risks, benefits and alternatives for the proposed anesthesia with the patient or authorized representative who has indicated his/her understanding and acceptance.   Dental advisory given  Plan Discussed with: CRNA and Surgeon  Anesthesia Plan Comments: (Plan routine monitors, GETA)        Anesthesia Quick Evaluation

## 2016-05-24 NOTE — Transfer of Care (Signed)
Immediate Anesthesia Transfer of Care Note  Patient: [name]  Procedure(s) Performed: Procedure(s): XI ROBOTIC  assisted LAPAROSCOPIC REPAIR HIATAL HERNINA WITH  NISSEN FUNDOPLICATION (N/A) HEMORRHOIDECTOMY (N/A)  Patient Location: PACU  Anesthesia Type:General  Level of Consciousness: sedated  Airway & Oxygen Therapy: Patient Spontanous Breathing and Patient connected to face mask oxygen  Post-op Assessment: Report given to RN and Post -op Vital signs reviewed and stable  Post vital signs: Reviewed and stable  Last Vitals:  Vitals:   05/24/16 0651  BP: (!) 141/92  Pulse: 85  Resp: 16  Temp: 36.8 C    Last Pain:  Vitals:   05/24/16 0651  TempSrc: Oral         Complications: No apparent anesthesia complications

## 2016-05-24 NOTE — Op Note (Signed)
Surgeon: Kaylyn Lim, MD, FACS  Asst:  Romana Juniper, MD  Anes:  general  Procedure: Robotic Xi and Laparoscopic Nissen fundoplication over 56 bougie with 3 suture posterior closure of the hiatus   Internal and external hemorrhoidectomy  Diagnosis: Symptomatic GERD with moderately large hiatal hernia;  Large protruding external and internal hemorrhoids  Complications: none  EBL:   18 cc  Drains: none  Description of Procedure:  The patient was taken to OR 1 at [facility].  After anesthesia was administered and the patient was prepped a timeout was performed.  The patient was situated on the OR table for docking with the XI robot from the right side. She was prepped and draped and a placed in the head up position. Following a timeout access the was achieved to the left upper quadrant with a 5 mm Optiview without difficulty. The pelvis did not have any  adhesions  from her prior a sigmoid colectomy (2010). We then began to getting ready for the robotic docking. I marked out my port sites across the abdomen to incorporate the entrance port as an operative port.  The 80mm trochars were placed. The Nathanson retractor was placed and secured on  the left side of the table I retract the liver upward. We did see that she  had a moderately large hiatal hernia with stomach up in the chest. This would certainly go along with her symptoms which included nocturnal reflux..  The robot was docked in and in the lateral most port on the left there was the angulated grasper followed by the vessel sealer. The camera was in the next port in the lateral most on the left was the fenestrated bipolar grasper.  Operation began using the vessel sealer to dissected the right crus and then carried this anteriorly over the esophagus. I then went over on the patient's left side and took down short gastrics mobilize that. A Penrose drain was placed around behind the esophagus. With that I found that the robotic visualization up in  the mediastinum was not adequate and was very dark. I elected to get out the laparoscope undock and a complete this procedure well with that.. Visualization was better with the laparoscope II was able to then place 3 sutures posteriorly the lower posterior to the sutures were tied using the Ethicon tying Babcock and the more medial closure suture was secured with a Ty Knot. We then passed the 56 bougie which caused esophagus to take up the whole space it we had closed. Around brought portion of the posterior fundus around and created the Nissen wrap with 3 sutures using the Endo Stitch and Ty- knots.  We checked all port sites and they  looked good. Injected with Expareil.    Next the wounds were closed with 4-0 Monocryl and Dermabond. She was and placed up in stirrups. In the resting position she had very prominent prolapse of her hemorrhoids with that no doubt were causing some hygiene issues. The anterior one at the 11:00 position was addressed first. It was grasped and the inside excised with the harmonic scalpel. External hemorrhoid was excised and this was closed with interrupted chromic sutures. Similarly at the 7:00 position of the 5:00 position a rather internal hemorrhoids were again grasped and excised with the harmonic scalpel and then external components were excised and approximated with 3-0 chromic. Here it was injected with ex Georges Lynch and a stone degradable spines was placed up in there with along with the linear Betadine ointment. The  perianal region was injected with 30 mL of Exparel remaining from the original dilution of 50 mL.  The patient tolerated the procedure well and was taken to the PACU in stable condition.     Matt B. Hassell Done, [place], [facility], [place]

## 2016-05-24 NOTE — Anesthesia Postprocedure Evaluation (Signed)
Anesthesia Post Note  Patient: VALARY CASAD  Procedure(s) Performed: Procedure(s) (LRB): XI ROBOTIC  assisted LAPAROSCOPIC REPAIR HIATAL HERNINA WITH  NISSEN FUNDOPLICATION (N/A) HEMORRHOIDECTOMY (N/A)  Patient location during evaluation: PACU Anesthesia Type: General Level of consciousness: awake and alert, oriented and patient cooperative Pain management: pain level controlled Vital Signs Assessment: post-procedure vital signs reviewed and stable Respiratory status: spontaneous breathing, nonlabored ventilation, respiratory function stable and patient connected to nasal cannula oxygen Cardiovascular status: blood pressure returned to baseline and stable Postop Assessment: no signs of nausea or vomiting Anesthetic complications: no    Last Vitals:  Vitals:   05/24/16 1330 05/24/16 1354  BP: 120/84 121/80  Pulse: 71   Resp: 13   Temp:  36.7 C    Last Pain:  Vitals:   05/24/16 1354  TempSrc:   PainSc: 2                  Lenville Hibberd,E. Javante Nilsson

## 2016-05-24 NOTE — Anesthesia Procedure Notes (Signed)
Procedure Name: Intubation Date/Time: 05/24/2016 8:43 AM Performed by: Danley Danker L Patient Re-evaluated:Patient Re-evaluated prior to inductionOxygen Delivery Method: Circle system utilized Preoxygenation: Pre-oxygenation with 100% oxygen Intubation Type: IV induction Ventilation: Mask ventilation without difficulty and Oral airway inserted - appropriate to patient size Laryngoscope Size: Sabra Heck and 2 Grade View: Grade I Tube type: Oral Tube size: 7.5 mm Number of attempts: 1 Airway Equipment and Method: Stylet Placement Confirmation: ETT inserted through vocal cords under direct vision,  positive ETCO2 and breath sounds checked- equal and bilateral Secured at: 20 cm Tube secured with: Tape Dental Injury: Teeth and Oropharynx as per pre-operative assessment and Injury to lip

## 2016-05-24 NOTE — H&P (Signed)
Chief Complaint:  Nocturnal reflux and GERD  History of Present Illness:  [name] is an 60 y.o. female who has had a normal manometry and nocturnal reflux causing her to sleep sitting up.  She has had informed consent regarding Nissen fundoplication and also repair of her hemorrhoids since these are causing her hygiene and pain issues.    Past Medical History:  Diagnosis Date  . Diverticulosis   . GERD (gastroesophageal reflux disease)   . History of gallstones   . History of hiatal hernia   . Hypertension   . Peptic ulcer disease 1991  . PONV (postoperative nausea and vomiting)     Past Surgical History:  Procedure Laterality Date  . CHOLECYSTECTOMY  991  . ESOPHAGEAL MANOMETRY N/A 03/10/2016   Procedure: ESOPHAGEAL MANOMETRY (EM);  Surgeon: Mauri Pole, MD;  Location: [facility];  Service: Endoscopy;  Laterality: N/A;  . left eye surgery      for macular hole   . PARTIAL COLECTOMY  2010  . TONSILLECTOMY  1966    Current Facility-Administered Medications  Medication Dose Route Frequency Provider Last Rate Last Dose  . bupivacaine liposome (EXPAREL) 1.3 % injection 266 mg  20 mL Infiltration Once Johnathan Hausen, MD      . ceFAZolin (ANCEF) IVPB 2g/100 mL premix  2 g Intravenous On Call to [place], MD      . Chlorhexidine Gluconate Cloth 2 % PADS 6 each  6 each Topical Once Johnathan Hausen, MD       And  . Chlorhexidine Gluconate Cloth 2 % PADS 6 each  6 each Topical Once Johnathan Hausen, MD      . lactated ringers infusion   Intravenous Continuous Annye Asa, MD 75 mL/hr at 05/24/16 0821     Vicodin [hydrocodone-acetaminophen] Family History  Problem Relation Age of Onset  . Diabetes Brother   . Colon cancer Neg Hx   . Colon polyps Neg Hx   . Rectal cancer Neg Hx   . Stomach cancer Neg Hx    Social History:   reports that she quit smoking about 42 years ago. Her smoking use included Cigarettes. She has never used smokeless tobacco. She  reports that she drinks alcohol. She reports that she does not use drugs.   REVIEW OF SYSTEMS : Negative except for drainage and URI  Physical Exam:   Blood pressure (!) 141/92, pulse 85, temperature 98.3 F (36.8 C), temperature source Oral, resp. rate 16, height 5' 3"  (1.6 m), weight 71.7 kg (158 lb), SpO2 100 %. Body mass index is 27.99 kg/m.  Gen:  WDWN WF NAD  Neurological: Alert and oriented to person, place, and time. Motor and sensory function is grossly intact  Head: Normocephalic and atraumatic.  Eyes: Conjunctivae are normal. Pupils are equal, round, and reactive to light. No scleral icterus.  Neck: Normal range of motion. Neck supple. No tracheal deviation or thyromegaly present.  Cardiovascular:  SR without murmurs or gallops.  No carotid bruits Breast:  Not examined Respiratory: Effort normal.  No respiratory distress. No chest wall tenderness. Breath sounds normal.  No wheezes, rales or rhonchi.  Abdomen:  Lower midline incision GU:  Not examined Musculoskeletal: Normal range of motion. Extremities are nontender. No cyanosis, edema or clubbing noted Lymphadenopathy: No cervical, preauricular, postauricular or axillary adenopathy is present Skin: Skin is warm and dry. No rash noted. No diaphoresis. No erythema. No pallor. Pscyh: Normal mood and affect. Behavior is normal. Judgment and thought content  normal.   LABORATORY RESULTS: Results for orders placed or performed during the hospital encounter of 05/22/16 (from the past 48 hour(s))  Basic metabolic panel     Status: None   Collection Time: 05/22/16 10:34 AM  Result Value Ref Range   Sodium 140 135 - 145 mmol/L   Potassium 3.5 3.5 - 5.1 mmol/L   Chloride 105 101 - 111 mmol/L   CO2 28 22 - 32 mmol/L   Glucose, Bld 74 65 - 99 mg/dL   BUN 15 6 - 20 mg/dL   Creatinine, Ser 0.81 0.44 - 1.00 mg/dL   Calcium 9.3 8.9 - 10.3 mg/dL   GFR calc non Af Amer >60 >60 mL/min   GFR calc Af Amer >60 >60 mL/min    Comment:  (NOTE) The eGFR has been calculated using the CKD EPI equation. This calculation has not been validated in all clinical situations. eGFR's persistently <60 mL/min signify possible Chronic Kidney Disease.    Anion gap 7 5 - 15  CBC     Status: None   Collection Time: 05/22/16 10:34 AM  Result Value Ref Range   WBC 6.7 4.0 - 10.5 K/uL   RBC 4.70 3.87 - 5.11 MIL/uL   Hemoglobin 12.4 12.0 - 15.0 g/dL   HCT 38.2 36.0 - 46.0 %   MCV 81.3 78.0 - 100.0 fL   MCH 26.4 26.0 - 34.0 pg   MCHC 32.5 30.0 - 36.0 g/dL   RDW 14.2 11.5 - 15.5 %   Platelets 289 150 - 400 K/uL     RADIOLOGY RESULTS: No results found.  Problem List: Patient Active Problem List   Diagnosis Date Noted  . Hiatal hernia   . Esophageal reflux 05/21/2012  . DIVERTICULOSIS, COLON 01/28/2009    Assessment & Plan: GERD and hemorroids;  For lap Nissen and repair of hemorrhoids.      Matt B. Hassell Done, MD, [facility] [phone] beeper [phone]  05/24/2016 8:34 AM

## 2016-05-25 ENCOUNTER — Inpatient Hospital Stay (HOSPITAL_COMMUNITY): Payer: BC Managed Care – PPO

## 2016-05-25 DIAGNOSIS — Z9889 Other specified postprocedural states: Secondary | ICD-10-CM

## 2016-05-25 DIAGNOSIS — K219 Gastro-esophageal reflux disease without esophagitis: Secondary | ICD-10-CM | POA: Diagnosis not present

## 2016-05-25 LAB — BASIC METABOLIC PANEL
ANION GAP: 6 (ref 5–15)
BUN: 11 mg/dL (ref 6–20)
CHLORIDE: 106 mmol/L (ref 101–111)
CO2: 27 mmol/L (ref 22–32)
Calcium: 8.3 mg/dL — ABNORMAL LOW (ref 8.9–10.3)
Creatinine, Ser: 0.72 mg/dL (ref 0.44–1.00)
GFR calc Af Amer: >60 mL/min (ref >60)
GFR calc non Af Amer: >60 mL/min (ref >60)
Glucose, Bld: 129 mg/dL — ABNORMAL HIGH (ref 65–99)
POTASSIUM: 3.4 mmol/L — AB (ref 3.5–5.1)
SODIUM: 139 mmol/L (ref 135–145)

## 2016-05-25 LAB — CBC
HCT: 32.5 % — ABNORMAL LOW (ref 36.0–46.0)
HEMOGLOBIN: 10.7 g/dL — AB (ref 12.0–15.0)
MCH: 27.1 pg (ref 26.0–34.0)
MCHC: 32.9 g/dL (ref 30.0–36.0)
MCV: 82.3 fL (ref 78.0–100.0)
Platelets: 221 10*3/uL (ref 150–400)
RBC: 3.95 MIL/uL (ref 3.87–5.11)
RDW: 14.5 % (ref 11.5–15.5)
WBC: 10 10*3/uL (ref 4.0–10.5)

## 2016-05-25 MED ORDER — IOPAMIDOL (ISOVUE-300) INJECTION 61%
50.0000 mL | Freq: Once | INTRAVENOUS | Status: AC | PRN
  Administered 2016-05-25: 50 mL via ORAL

## 2016-05-25 MED ORDER — ACETAMINOPHEN 160 MG/5ML PO SOLN
650.0000 mg | Freq: Four times a day (QID) | ORAL | Status: DC | PRN
  Administered 2016-05-25: 650 mg via ORAL
  Filled 2016-05-25: qty 20.3

## 2016-05-25 NOTE — Progress Notes (Signed)
Patient ID: [name], female   DOB: 1955/12/15, 60 y.o.   MRN: [id number] [facility] Surgery Progress Note:   1 Day Post-Op  Subjective: Mental status is clear. No complaints Objective: Vital signs in last 24 hours: Temp:  [97.8 F (36.6 C)-99.1 F (37.3 C)] 98.3 F (36.8 C) (11/09 0957) Pulse Rate:  [69-80] 74 (11/09 0957) Resp:  [10-17] 17 (11/09 0957) BP: (97-125)/(66-84) 109/69 (11/09 0957) SpO2:  [97 %-100 %] 99 % (11/09 0957)  Intake/Output from previous day: 11/08 0701 - 11/09 0700 In: 3000 [I.V.:2950; IV Piggyback:50] Out: 1450 [Urine:1400; Blood:50] Intake/Output this shift: Total I/O In: 0  Out: 275 [Urine:275]  Physical Exam: Work of breathing is normal.  Incisions and anus are not hurting  Lab Results:  Results for orders placed or performed during the hospital encounter of 05/24/16 (from the past 48 hour(s))  CBC     Status: Abnormal   Collection Time: 05/24/16  2:30 PM  Result Value Ref Range   WBC 12.1 (H) 4.0 - 10.5 K/uL   RBC 4.44 3.87 - 5.11 MIL/uL   Hemoglobin 11.9 (L) 12.0 - 15.0 g/dL   HCT 36.4 36.0 - 46.0 %   MCV 82.0 78.0 - 100.0 fL   MCH 26.8 26.0 - 34.0 pg   MCHC 32.7 30.0 - 36.0 g/dL   RDW 14.5 11.5 - 15.5 %   Platelets 221 150 - 400 K/uL  Creatinine, serum     Status: None   Collection Time: 05/24/16  2:30 PM  Result Value Ref Range   Creatinine, Ser 0.96 0.44 - 1.00 mg/dL   GFR calc non Af Amer >60 >60 mL/min   GFR calc Af Amer >60 >60 mL/min    Comment: (NOTE) The eGFR has been calculated using the CKD EPI equation. This calculation has not been validated in all clinical situations. eGFR's persistently <60 mL/min signify possible Chronic Kidney Disease.   CBC     Status: Abnormal   Collection Time: 05/25/16  4:27 AM  Result Value Ref Range   WBC 10.0 4.0 - 10.5 K/uL   RBC 3.95 3.87 - 5.11 MIL/uL   Hemoglobin 10.7 (L) 12.0 - 15.0 g/dL   HCT 32.5 (L) 36.0 - 46.0 %   MCV 82.3 78.0 - 100.0 fL   MCH 27.1 26.0 - 34.0 pg   MCHC 32.9 30.0 - 36.0 g/dL   RDW 14.5 11.5 - 15.5 %   Platelets 221 150 - 400 K/uL  Basic metabolic panel     Status: Abnormal   Collection Time: 05/25/16  4:27 AM  Result Value Ref Range   Sodium 139 135 - 145 mmol/L   Potassium 3.4 (L) 3.5 - 5.1 mmol/L   Chloride 106 101 - 111 mmol/L   CO2 27 22 - 32 mmol/L   Glucose, Bld 129 (H) 65 - 99 mg/dL   BUN 11 6 - 20 mg/dL   Creatinine, Ser 0.72 0.44 - 1.00 mg/dL   Calcium 8.3 (L) 8.9 - 10.3 mg/dL   GFR calc non Af Amer >60 >60 mL/min   GFR calc Af Amer >60 >60 mL/min    Comment: (NOTE) The eGFR has been calculated using the CKD EPI equation. This calculation has not been validated in all clinical situations. eGFR's persistently <60 mL/min signify possible Chronic Kidney Disease.    Anion gap 6 5 - 15    Radiology/Results: No results found.  Anti-infectives: Anti-infectives    Start     Dose/Rate Route Frequency Ordered  Stop   05/24/16 2000  ceFAZolin (ANCEF) IVPB 2g/100 mL premix     2 g 200 mL/hr over 30 Minutes Intravenous Every 8 hours 05/24/16 1419 05/24/16 2030   05/24/16 0703  ceFAZolin (ANCEF) IVPB 2g/100 mL premix     2 g 200 mL/hr over 30 Minutes Intravenous On call to O.R. 05/24/16 0703 05/24/16 1235      Assessment/Plan: Problem List: Patient Active Problem List   Diagnosis Date Noted  . Hiatal hernia   . Esophageal reflux 05/21/2012  . DIVERTICULOSIS, COLON 01/28/2009    UGI reviewed and will start clears.  Also begin Sitz baths 1 Day Post-Op    LOS: 1 day   Matt B. Hassell Done, MD, [facility] [phone] beeper [phone]  05/25/2016 10:48 AM

## 2016-05-26 DIAGNOSIS — K219 Gastro-esophageal reflux disease without esophagitis: Secondary | ICD-10-CM | POA: Diagnosis not present

## 2016-05-26 LAB — BASIC METABOLIC PANEL
ANION GAP: 3 — AB (ref 5–15)
BUN: 7 mg/dL (ref 6–20)
CALCIUM: 8.3 mg/dL — AB (ref 8.9–10.3)
CHLORIDE: 111 mmol/L (ref 101–111)
CO2: 26 mmol/L (ref 22–32)
Creatinine, Ser: 0.65 mg/dL (ref 0.44–1.00)
GFR calc Af Amer: >60 mL/min (ref >60)
GFR calc non Af Amer: >60 mL/min (ref >60)
GLUCOSE: 107 mg/dL — AB (ref 65–99)
Potassium: 3.7 mmol/L (ref 3.5–5.1)
Sodium: 140 mmol/L (ref 135–145)

## 2016-05-26 LAB — CBC
HEMATOCRIT: 32.3 % — AB (ref 36.0–46.0)
HEMOGLOBIN: 10.2 g/dL — AB (ref 12.0–15.0)
MCH: 26 pg (ref 26.0–34.0)
MCHC: 31.6 g/dL (ref 30.0–36.0)
MCV: 82.2 fL (ref 78.0–100.0)
Platelets: 198 10*3/uL (ref 150–400)
RBC: 3.93 MIL/uL (ref 3.87–5.11)
RDW: 14.5 % (ref 11.5–15.5)
WBC: 7.6 10*3/uL (ref 4.0–10.5)

## 2016-05-26 MED ORDER — ONDANSETRON 4 MG PO TBDP: 4.0000 mg | ORAL_TABLET | Freq: Four times a day (QID) | ORAL | 0 refills | Status: DC | PRN

## 2016-05-26 MED ORDER — HYDROCODONE-ACETAMINOPHEN 7.5-325 MG/15ML PO SOLN: 15.0000 mL | ORAL | 0 refills | Status: DC | PRN

## 2016-05-26 NOTE — Discharge Instructions (Signed)
Surgical Procedures for Hemorrhoids, Care After Refer to this sheet in the next few weeks. These instructions provide you with information about caring for yourself after your procedure. Your health care provider may also give you more specific instructions. Your treatment has been planned according to current medical practices, but problems sometimes occur. Call your health care provider if you have any problems or questions after your procedure. WHAT TO EXPECT AFTER THE PROCEDURE After your procedure, it is common to have:  Rectal pain.  Pain when you are having a bowel movement.  Slight rectal bleeding. HOME CARE INSTRUCTIONS Medicines  Take over-the-counter and prescription medicines only as told by your health care provider.  Do not drive or operate heavy machinery while taking prescription pain medicine.  Use a stool softener or a bulk laxative as told by your health care provider. Activities  Rest at home. Return to your normal activities as told by your health care provider.  Do not lift anything that is heavier than 10 lb (4.5 kg).  Do not sit for long periods of time. Take a walk every day or as told by your health care provider.  Do not strain to have a bowel movement. Do not spend a long time sitting on the toilet. Eating and Drinking  Eat foods that contain fiber, such as whole grains, beans, nuts, fruits, and vegetables.  Drink enough fluid to keep your urine clear or pale yellow. General Instructions  Sit in a warm bath 2-3 times per day to relieve soreness or itching.  Keep all follow-up visits as told by your health care provider. This is important. SEEK MEDICAL CARE IF:  Your pain medicine is not helping.  You have a fever or chills.  You become constipated.  You have trouble passing urine. SEEK IMMEDIATE MEDICAL CARE IF:  You have very bad rectal pain.  You have heavy bleeding from your rectum.   This information is not intended to replace advice  given to you by your health care provider. Make sure you discuss any questions you have with your health care provider.   Document Released: 09/23/2003 Document Revised: 03/24/2015 Document Reviewed: 09/28/2014 Elsevier Interactive Patient Education 2016 Elsevier Inc. Laparoscopic Nissen Fundoplication, Care After Refer to this sheet in the next few weeks. These instructions provide you with information about caring for yourself after your procedure. Your health care provider may also give you more specific instructions. Your treatment has been planned according to current medical practices, but problems sometimes occur. Call your health care provider if you have any problems or questions after your procedure. WHAT TO EXPECT AFTER THE PROCEDURE After your procedure, it is common to have:   Difficulty swallowing (dysphagia).  Excess gas (bloating). HOME CARE INSTRUCTIONS Medicines  Take medicines only as directed by your health care provider.  Do not drive or operate heavy machinery while taking pain medicine. Incision Care  There are many different ways to close and cover an incision, including stitches (sutures), skin glue, and adhesive strips. Follow your health care provider's instructions about:  Incision care.  Bandage (dressing) changes and removal.  Incision closure removal.  Check your incision areas every day for signs of infection. Watch for:  Redness, swelling, or pain.  Fluid, blood, or pus.  Do not take baths, swim, or use a hot tub until your health care provider approves. Take showers as directed by your health care provider. Eating and Drinking  Follow your health care provider's instructions about eating.  You may need  to follow a liquid-only diet for 2 weeks, followed by a diet of soft foods for 2 weeks.  You should return to your usual diet gradually.  Drink enough fluid to keep your urine clear or pale yellow. Activities  Return to your normal  activities as directed by your health care provider. Ask your health care provider what activities are safe for you.  Avoid strenuous exercise.  Do not lift anything that is heavier than 10 lb (4.5 kg).  Ask your health care provider when you can:  Return to sexual activity.  Drive.  Go back to work. SEEK MEDICAL CARE IF:  You have a fever.  Your pain gets worse or is not helped by medicine.  You have frequent nausea or vomiting.  You have continued abdominal bloating.  You have an ongoing (persistent) cough.  You have redness, swelling, or pain in any incision areas.  You have fluid, blood, or pus coming from any incisions. SEEK IMMEDIATE MEDICAL CARE IF:  You have trouble breathing.  You are unable to swallow.  You have persistent vomiting.  You have blood in your vomit.  You have severe abdominal pain.   This information is not intended to replace advice given to you by your health care provider. Make sure you discuss any questions you have with your health care provider.   Document Released: 02/24/2004 Document Revised: 07/24/2014 Document Reviewed: 03/04/2014 Elsevier Interactive Patient Education [employer].

## 2016-05-26 NOTE — Discharge Summary (Signed)
Physician Discharge Summary  Patient ID: [name] MRN: [id number] DOB/AGE: 1956/02/20 60 y.o.  Admit date: 05/24/2016 Discharge date: 05/26/2016  Admission Diagnoses:  GERD and severe hemorrhoids  Discharge Diagnoses:  same  Active Problems:   Hiatal hernia   Status post Nissen fundoplication Nov 0000000   Surgery:  Lap Nissen and hemorrhoidectomy  Discharged Condition: improved  Hospital Course:   Had surgery.  UGI on PD 1 looked OK.  Started on clears which were tolerated fine.  No pain in incisions and anus probably because of the Exparel injection.  Ready for discharge on PD 2  Consults: none  Significant Diagnostic Studies: UGI    Discharge Exam: Blood pressure 130/81, pulse 88, temperature 99.1 F (37.3 C), temperature source Oral, resp. rate 16, height 5\' 3"  (1.6 m), weight 71.7 kg (158 lb), SpO2 96 %. Incisions OK.    Disposition: 01-Home or Self Care  Discharge Instructions    Call MD for:  redness, tenderness, or signs of infection (pain, swelling, redness, odor or green/yellow discharge around incision site)    Complete by:  As directed    Call MD for:  severe uncontrolled pain    Complete by:  As directed    Diet - low sodium heart healthy    Complete by:  As directed    Full liquid diet for one week followed by a pureed diet for 3 weeks   Discharge instructions    Complete by:  As directed    Shower to wash your perianal region as needed and after BMs.  Use hairdryer on low to dry area   Increase activity slowly    Complete by:  As directed        Medication List    TAKE these medications   aspirin 81 MG tablet Take 1 tablet by mouth daily.   calcium carbonate 1500 (600 Ca) MG Tabs tablet Commonly known as:  OSCAL Take 1 tablet by mouth daily.   HYDROcodone-acetaminophen 7.5-325 mg/15 ml solution Commonly known as:  HYCET Take 15 mLs by mouth every 4 (four) hours as needed for moderate pain.   omeprazole 20 MG capsule Commonly known  as:  PRILOSEC Take 1 capsule by mouth daily.   ondansetron 4 MG disintegrating tablet Commonly known as:  ZOFRAN-ODT Take 1 tablet (4 mg total) by mouth every 6 (six) hours as needed for nausea.   OVER THE COUNTER MEDICATION Take 1 tablet by mouth daily. For eyes   PROBIOTIC DAILY PO Take 1 capsule by mouth daily.   ranitidine 150 MG tablet Commonly known as:  ZANTAC Take 150 mg by mouth as needed for heartburn.   triamterene-hydrochlorothiazide 75-50 MG tablet Commonly known as:  MAXZIDE Take 1 tablet by mouth daily.      Follow-up Information    Shella Lahman B, MD. Schedule an appointment as soon as possible for a visit in 4 week(s).   Specialty:  General Surgery Contact information: [address] [place] [address] [phone]           Signed: Pedro Earls 05/26/2016, 8:32 AM

## 2016-12-08 ENCOUNTER — Encounter: Payer: Self-pay | Admitting: Gastroenterology

## 2018-04-10 ENCOUNTER — Other Ambulatory Visit: Payer: Self-pay | Admitting: Family Medicine

## 2018-04-10 DIAGNOSIS — Z1231 Encounter for screening mammogram for malignant neoplasm of breast: Secondary | ICD-10-CM

## 2018-05-15 ENCOUNTER — Ambulatory Visit
Admission: RE | Admit: 2018-05-15 | Discharge: 2018-05-15 | Disposition: A | Discharge status: Home / Self Care | Payer: BC Managed Care – PPO | Source: Ambulatory Visit | Attending: Family Medicine | Admitting: Family Medicine

## 2018-05-15 DIAGNOSIS — Z1231 Encounter for screening mammogram for malignant neoplasm of breast: Secondary | ICD-10-CM

## 2019-05-06 ENCOUNTER — Other Ambulatory Visit: Payer: Self-pay | Admitting: Family Medicine

## 2019-05-06 DIAGNOSIS — Z1231 Encounter for screening mammogram for malignant neoplasm of breast: Secondary | ICD-10-CM

## 2019-06-26 ENCOUNTER — Ambulatory Visit
Admission: RE | Admit: 2019-06-26 | Discharge: 2019-06-26 | Disposition: A | Discharge status: Home / Self Care | Payer: BC Managed Care – PPO | Source: Ambulatory Visit | Attending: Family Medicine | Admitting: Family Medicine

## 2019-06-26 ENCOUNTER — Other Ambulatory Visit: Payer: Self-pay

## 2019-06-26 DIAGNOSIS — Z1231 Encounter for screening mammogram for malignant neoplasm of breast: Secondary | ICD-10-CM

## 2019-10-09 ENCOUNTER — Ambulatory Visit: Payer: BC Managed Care – PPO | Attending: Internal Medicine

## 2019-10-09 DIAGNOSIS — Z23 Encounter for immunization: Secondary | ICD-10-CM

## 2019-10-09 NOTE — Progress Notes (Signed)
   Covid-19 Vaccination Clinic  Name:  [name]    MRN: [id number] DOB: 04/27/1956  10/09/2019  [name] was observed post Covid-19 immunization for 15 minutes without incident. She was provided with Vaccine Information Sheet and instruction to access the V-Safe system.   [name] was instructed to call 911 with any severe reactions post vaccine: Marland Kitchen Difficulty breathing  . Swelling of face and throat  . A fast heartbeat  . A bad rash all over body  . Dizziness and weakness   Immunizations Administered    Name Date Dose VIS Date Route   Pfizer COVID-19 Vaccine 10/09/2019  9:29 AM 0.3 mL 06/27/2019 Intramuscular   Manufacturer: [place]   Lot: [id number]   [place]: [id number]

## 2019-10-29 ENCOUNTER — Ambulatory Visit: Payer: BC Managed Care – PPO | Admitting: Dermatology

## 2019-10-29 ENCOUNTER — Other Ambulatory Visit: Payer: Self-pay

## 2019-10-29 DIAGNOSIS — L814 Other melanin hyperpigmentation: Secondary | ICD-10-CM

## 2019-10-29 DIAGNOSIS — L578 Other skin changes due to chronic exposure to nonionizing radiation: Secondary | ICD-10-CM | POA: Diagnosis not present

## 2019-10-29 DIAGNOSIS — L570 Actinic keratosis: Secondary | ICD-10-CM | POA: Diagnosis not present

## 2019-10-29 DIAGNOSIS — Z86018 Personal history of other benign neoplasm: Secondary | ICD-10-CM | POA: Diagnosis not present

## 2019-10-29 DIAGNOSIS — L821 Other seborrheic keratosis: Secondary | ICD-10-CM

## 2019-10-29 DIAGNOSIS — L820 Inflamed seborrheic keratosis: Secondary | ICD-10-CM

## 2019-10-29 DIAGNOSIS — Z85828 Personal history of other malignant neoplasm of skin: Secondary | ICD-10-CM

## 2019-10-29 DIAGNOSIS — Z1283 Encounter for screening for malignant neoplasm of skin: Secondary | ICD-10-CM | POA: Diagnosis not present

## 2019-10-29 DIAGNOSIS — D1800 Hemangioma unspecified site: Secondary | ICD-10-CM

## 2019-10-29 NOTE — Patient Instructions (Addendum)
Recommend daily broad spectrum sunscreen SPF 30+ to sun-exposed areas, reapply every 2 hours as needed. Call for new or changing lesions.  Cryotherapy Aftercare  . Wash gently with soap and water everyday.   . Apply Vaseline and Band-Aid daily until healed.  Prior to procedure, discussed risks of blister formation, small wound, skin dyspigmentation, or rare scar following cryotherapy.   Recommend taking Heliocare sun protection supplement daily in sunny weather for additional sun protection. For maximum protection on the sunniest days, you can take up to 2 capsules of regular Heliocare OR take 1 capsule of Heliocare Ultra. For prolonged exposure (such as a full day in the sun), you can repeat your dose of the supplement 4 hours after your first dose. Heliocare can be purchased at [facility] or at [email or web address].   

## 2019-10-29 NOTE — Progress Notes (Signed)
Follow-Up Visit   Subjective  [name] is a 64 y.o. female who presents for the following: Annual Exam (HxSCC, BCC, DN).  Patient complains of multiple dry, irritated spots that she scratches. She is not aware of any new spots.  Patient has used [employer] 5FU/Calcipotriene to chest and did not notice much reaction. She has also done PDT in the past to hands.    The following portions of the chart were reviewed this encounter and updated as appropriate: Tobacco  Allergies  Meds  Problems  Med Hx  Surg Hx  Fam Hx      Review of Systems: No other skin or systemic complaints.  Objective  Well appearing patient in no apparent distress; mood and affect are within normal limits.  A full examination was performed including scalp, head, eyes, ears, nose, lips, neck, chest, axillae, abdomen, back, buttocks, bilateral upper extremities, bilateral lower extremities, hands, feet, fingers, toes, fingernails, and toenails. All findings within normal limits unless otherwise noted below.  Objective  L 4th finger x 1, L 2nd MCP x 1, L dorsal hand x 1, R 4th finger x 2 (5), chest x 5, L antecubital fossa x 1, L shoulder x 1, R knee x 1, L mid back x 1, L calf x 1 (10): Erythematous thin papules/macules with gritty scale.  Pigmented at left calf.   Objective  Chest, hands, lips, face: Diffuse scaly erythematous macules with underlying dyspigmentation.   Objective  Left posterior waistline: Scar with no evidence of recurrence, mild atypia  Objective  Right Chest x 1: Erythematous keratotic or waxy stuck-on papule or plaque.   Assessment & Plan  AK (actinic keratosis) (15) L 4th finger x 1, L 2nd MCP x 1, L dorsal hand x 1, R 4th finger x 2 (5); chest x 5, L antecubital fossa x 1, L shoulder x 1, R knee x 1, L mid back x 1, L calf x 1 (10)  Recheck R knee, L calf on follow up.   Destruction of lesion - L 4th finger x 1, L 2nd MCP x 1, L dorsal hand x 1, R 4th finger x 2,  chest x 5, L antecubital fossa x 1, L shoulder x 1, R knee x 1, L mid back x 1, L calf x 1  Destruction method: cryotherapy   Informed consent: discussed and consent obtained   Lesion destroyed using liquid nitrogen: Yes   Cryotherapy cycles:  2 Outcome: patient tolerated procedure well with no complications   Post-procedure details: wound care instructions given    Actinic skin damage Chest, hands, lips, face  Discussed fluorouracil with calcipotriene BID for 5 days to face and 1 week to hands, feet. Pt defers treatment today but will plan to treat topically in the fall. She prefers sent in separately over [employer].  Patient prefers topical cream to treat over PDT.   History of dysplastic nevus Left posterior waistline  No evidence of recurrence, call clinic for new or changing lesions.   Inflamed seborrheic keratosis Right Chest x 1  Destruction of lesion - Right Chest x 1  Destruction method: cryotherapy   Informed consent: discussed and consent obtained   Lesion destroyed using liquid nitrogen: Yes   Cryotherapy cycles:  2 Outcome: patient tolerated procedure well with no complications   Post-procedure details: wound care instructions given     Skin cancer screening performed today.  History of Basal Cell Carcinoma of the Skin - No evidence of recurrence today -  Recommend regular full body skin exams - Recommend daily broad spectrum sunscreen SPF 30+ to sun-exposed areas, reapply every 2 hours as needed.  - Call if any new or changing lesions are noted between office visits  History of Squamous Cell Carcinoma of the Skin - No evidence of recurrence today - No lymphadenopathy - Recommend regular full body skin exams - Recommend daily broad spectrum sunscreen SPF 30+ to sun-exposed areas, reapply every 2 hours as needed.  - Call if any new or changing lesions are noted between office visits  Seborrheic Keratoses - Stuck-on, waxy, tan-brown papules and plaques    - Discussed benign etiology and prognosis. - Observe - Call for any changes  Hemangiomas - Red papules - Discussed benign nature - Observe - Call for any changes  Lentigines - Scattered tan macules - Discussed due to sun exposure - Benign, observe - Call for any changes  Return in about 6 months (around 04/29/2020) for TBSE, Follow up AK's.   Graciella Belton, RMA, am acting as scribe for Forest Gleason, MD .  Documentation: I have reviewed the above documentation for accuracy and completeness, and I agree with the above.  Forest Gleason, MD

## 2019-11-03 ENCOUNTER — Ambulatory Visit: Payer: BC Managed Care – PPO | Attending: Internal Medicine

## 2019-11-03 DIAGNOSIS — Z23 Encounter for immunization: Secondary | ICD-10-CM

## 2019-11-03 NOTE — Progress Notes (Signed)
   Covid-19 Vaccination Clinic  Name:  YAMNA SCHLOUGH    MRN: [id number] DOB: 1955-12-19  11/03/2019  Ms. Gatchell was observed post Covid-19 immunization for 15 minutes without incident. She was provided with Vaccine Information Sheet and instruction to access the V-Safe system.   Ms. Stankovic was instructed to call 911 with any severe reactions post vaccine: Marland Kitchen Difficulty breathing  . Swelling of face and throat  . A fast heartbeat  . A bad rash all over body  . Dizziness and weakness   Immunizations Administered    Name Date Dose VIS Date Route   Pfizer COVID-19 Vaccine 11/03/2019  9:30 AM 0.3 mL 09/10/2018 Intramuscular   Manufacturer: [place]   Lot: [id number]   [place]: [id number]

## 2019-11-10 ENCOUNTER — Encounter: Payer: Self-pay | Admitting: Dermatology

## 2020-04-13 ENCOUNTER — Telehealth: Payer: Self-pay

## 2020-04-13 NOTE — Telephone Encounter (Signed)
Patient called in regarding her visit in April. You documented using 5FU and Calcipotriene to face, hands and feet. She has called and asking about use to chest. She also found a tube of Fluroplex, can she use this?

## 2020-04-13 NOTE — Telephone Encounter (Signed)
I reviewed her chart. Yes, she can use 5 fluorouracil/calcipotriene to treat her chest as well. She will likely need to treat for 1 week to get a good response to her chest. She should expect redness and crusting but no pain or open areas. If she develops either of those she should stop treatment and start Vaseline.   She is actually due for a full skin exam and follow-up of precancers. Please schedule her for next available. Okay to schedule on a Tuesday.  Fluoroplex is 5-fluorouracil alone. It can be used to treat this but it takes a longer treatment course. She will need to treat with 2 or 3 weeks to get the same reaction with that.  MAs please call and advise patient and schedule her for follow-up.

## 2020-05-06 ENCOUNTER — Telehealth: Payer: Self-pay

## 2020-05-06 NOTE — Telephone Encounter (Signed)
Spoke with patient concerning her questions about using Fluroplex. I have previously left messages for patient to call. When I spoke with her today she said she had already been advised it was ok to use but she will wait until follow up to discuss, JS

## 2020-05-12 ENCOUNTER — Encounter: Payer: BC Managed Care – PPO | Admitting: Dermatology

## 2020-05-25 ENCOUNTER — Other Ambulatory Visit: Payer: Self-pay | Admitting: Family Medicine

## 2020-05-25 DIAGNOSIS — Z1231 Encounter for screening mammogram for malignant neoplasm of breast: Secondary | ICD-10-CM

## 2020-06-16 ENCOUNTER — Ambulatory Visit: Payer: BC Managed Care – PPO | Admitting: Dermatology

## 2020-06-16 ENCOUNTER — Other Ambulatory Visit: Payer: Self-pay

## 2020-06-16 DIAGNOSIS — Z85828 Personal history of other malignant neoplasm of skin: Secondary | ICD-10-CM

## 2020-06-16 DIAGNOSIS — Z1283 Encounter for screening for malignant neoplasm of skin: Secondary | ICD-10-CM

## 2020-06-16 DIAGNOSIS — D1800 Hemangioma unspecified site: Secondary | ICD-10-CM

## 2020-06-16 DIAGNOSIS — Z86018 Personal history of other benign neoplasm: Secondary | ICD-10-CM

## 2020-06-16 DIAGNOSIS — D485 Neoplasm of uncertain behavior of skin: Secondary | ICD-10-CM | POA: Diagnosis not present

## 2020-06-16 DIAGNOSIS — L578 Other skin changes due to chronic exposure to nonionizing radiation: Secondary | ICD-10-CM | POA: Diagnosis not present

## 2020-06-16 DIAGNOSIS — L570 Actinic keratosis: Secondary | ICD-10-CM | POA: Diagnosis not present

## 2020-06-16 DIAGNOSIS — Z86007 Personal history of in-situ neoplasm of skin: Secondary | ICD-10-CM

## 2020-06-16 DIAGNOSIS — L821 Other seborrheic keratosis: Secondary | ICD-10-CM

## 2020-06-16 DIAGNOSIS — D229 Melanocytic nevi, unspecified: Secondary | ICD-10-CM

## 2020-06-16 DIAGNOSIS — L814 Other melanin hyperpigmentation: Secondary | ICD-10-CM

## 2020-06-16 NOTE — Patient Instructions (Addendum)
Melanoma ABCDEs  Melanoma is the most dangerous type of skin cancer, and is the leading cause of death from skin disease.  You are more likely to develop melanoma if you:  Have light-colored skin, light-colored eyes, or red or blond hair  Spend a lot of time in the sun  Tan regularly, either outdoors or in a tanning bed  Have had blistering sunburns, especially during childhood  Have a close family member who has had a melanoma  Have atypical moles or large birthmarks  Early detection of melanoma is key since treatment is typically straightforward and cure rates are extremely high if we catch it early.   The first sign of melanoma is often a change in a mole or a new dark spot.  The ABCDE system is a way of remembering the signs of melanoma.  A for asymmetry:  The two halves do not match. B for border:  The edges of the growth are irregular. C for color:  A mixture of colors are present instead of an even brown color. D for diameter:  Melanomas are usually (but not always) greater than 30mm - the size of a pencil eraser. E for evolution:  The spot keeps changing in size, shape, and color.  Please check your skin once per month between visits. You can use a small mirror in front and a large mirror behind you to keep an eye on the back side or your body.   If you see any new or changing lesions before your next follow-up, please call to schedule a visit.  Please continue daily skin protection including broad spectrum sunscreen SPF 30+ to sun-exposed areas, reapplying every 2 hours as needed when you're outdoors.   Cryotherapy Aftercare  . Wash gently with soap and water everyday.   Marland Kitchen Apply Vaseline and Band-Aid daily until healed.  Prior to procedure, discussed risks of blister formation, small wound, skin dyspigmentation, or rare scar following cryotherapy.   Instructions for [facility] Medications  One or more of your medications was sent to the [facility] mail order  compounding pharmacy. You will receive an email from them and can purchase the medicine through that link. It will then be mailed to your home at the address you confirmed. If for any reason you do not receive an email from them, please check your spam folder. If you still do not find the email, please let us know. [facility] phone number is [phone].   Recommend Nicotinamide 500mg  twice per day to lower risk of non-melanoma skin cancer by approximately 25%.   Recommend taking Heliocare sun protection supplement daily in sunny weather for additional sun protection. For maximum protection on the sunniest days, you can take up to 2 capsules of regular Heliocare OR take 1 capsule of Heliocare Ultra. For prolonged exposure (such as a full day in the sun), you can repeat your dose of the supplement 4 hours after your first dose. Heliocare can be purchased at [facility] or at [email or web address].   5-Fluorouracil/Calcipotriene Patient Education   Actinic keratoses are the dry, red scaly spots on the skin caused by sun damage. A portion of these spots can turn into skin cancer with time, and treating them can help prevent development of skin cancer.   Treatment of these spots requires removal of the defective skin cells. There are various ways to remove actinic keratoses, including freezing with liquid nitrogen, treatment with creams, or treatment with a blue light procedure in the office.  5-fluorouracil cream is a topical cream used to treat actinic keratoses. It works by interfering with the growth of abnormal fast-growing skin cells, such as actinic keratoses. These cells peel off and are replaced by healthy ones.   5-fluorouracil/calcipotriene is a combination of the 5-fluorouracil cream with a vitamin D analog cream called calcipotriene. The calcipotriene alone does not treat actinic keratoses. However, when it is combined with 5-fluorouracil, it helps the 5-fluorouracil treat the  actinic keratoses much faster so that the same results can be achieved with a much shorter treatment time.  INSTRUCTIONS FOR 5-FLUOROURACIL/CALCIPOTRIENE CREAM:   Start 5FU/calcipotriene to tops of hands and forearms twice daily x 7 days then will plan to check patient in clinic. Once arms and hands healing may start treatment to chest. Only use for 4 days to areas at face.   5-fluorouracil/calcipotriene cream typically only needs to be used for 4-7 days. A thin layer should be applied twice a day to the treatment areas recommended by your physician.   If your physician prescribed you separate tubes of 5-fluourouracil and calcipotriene, apply a thin layer of 5-fluorouracil followed by a thin layer of calcipotriene.   Avoid contact with your eyes, nostrils, and mouth. Do not use 5-fluorouracil/calcipotriene cream on infected or open wounds.   You will develop redness, irritation and some crusting at areas where you have pre-cancer damage/actinic keratoses. IF YOU DEVELOP PAIN, BLEEDING, OR SIGNIFICANT CRUSTING, STOP THE TREATMENT EARLY - you have already gotten a good response and the actinic keratoses should clear up well.  Wash your hands after applying 5-fluorouracil 5% cream on your skin.   A moisturizer or sunscreen with a minimum SPF 30 should be applied each morning.   Once you have finished the treatment, you can apply a thin layer of Vaseline twice a day to irritated areas to soothe and calm the areas more quickly. If you experience significant discomfort, contact your physician.  For some patients it is necessary to repeat the treatment for best results.  SIDE EFFECTS: When using 5-fluorouracil/calcipotriene cream, you may have mild irritation, such as redness, dryness, swelling, or a mild burning sensation. This usually resolves within 2 weeks. The more actinic keratoses you have, the more redness and inflammation you can expect during treatment. Eye irritation has been reported  rarely. If this occurs, please let us know.  If you have any trouble using this cream, please call the office. If you have any other questions about this information, please do not hesitate to ask me before you leave the office.

## 2020-06-16 NOTE — Progress Notes (Signed)
Follow-Up Visit   Subjective  [name] is a 64 y.o. female who presents for the following: FBSE (Patient with a history of SCC, BCC, DN and AK. There is nothing new or changing patient is aware of today.).  Patient here for full body skin exam and skin cancer screening. Patient has done PDT for AKs in the past but does not think it was very effective.   The following portions of the chart were reviewed this encounter and updated as appropriate:   Tobacco  Allergies  Meds  Problems  Med Hx  Surg Hx  Fam Hx      Review of Systems:  No other skin or systemic complaints except as noted in HPI or Assessment and Plan.  Objective  Well appearing patient in no apparent distress; mood and affect are within normal limits.  A full examination was performed including scalp, head, eyes, ears, nose, lips, neck, chest, axillae, abdomen, back, buttocks, bilateral upper extremities, bilateral lower extremities, hands, feet, fingers, toes, fingernails, and toenails. All findings within normal limits unless otherwise noted below.  Objective  chest x 2, right 4th toe x 1, right 1st toe x 1, right 2nd toe x 1, right thigh x 1 (6): Erythematous thin papules/macules with gritty scale.   Objective  Right 4th finger between MCP and PIP: 0.4cm subcutaneous mobile papule with overlying violaceous macule, unchanging by history     Assessment & Plan  AK (actinic keratosis) (6) chest x 2, right 4th toe x 1, right 1st toe x 1, right 2nd toe x 1, right thigh x 1  Hypertrophic at right 4th toe, recheck and consider biopsy if indicated. Hypertrophic at right 1st and 2nd toe  Prior to procedure, discussed risks of blister formation, small wound, skin dyspigmentation, or rare scar following cryotherapy.    Destruction of lesion - chest x 2, right 4th toe x 1, right 1st toe x 1, right 2nd toe x 1, right thigh x 1 Complexity: simple   Destruction method: cryotherapy   Informed consent:  discussed and consent obtained   Lesion destroyed using liquid nitrogen: Yes   Cryotherapy cycles:  2 Outcome: patient tolerated procedure well with no complications   Post-procedure details: wound care instructions given    Neoplasm of uncertain behavior of skin Right 4th finger between MCP and PIP  Favor calcinosis cutis vs calcified lipoma vs other  Favor benign by history and exam  Observation.  Call clinic for new or changing lesions.     Lentigines - Scattered tan macules - Discussed due to sun exposure - Benign, observe - Call for any changes  Seborrheic Keratoses - Stuck-on, waxy, tan-brown papules and plaques  - Discussed benign etiology and prognosis. - Observe - Call for any changes  Melanocytic Nevi - Tan-brown and/or pink-flesh-colored symmetric macules and papules - Benign appearing on exam today - Observation - Call clinic for new or changing moles - Recommend daily use of broad spectrum spf 30+ sunscreen to sun-exposed areas.   Hemangiomas - Red papules - Discussed benign nature - Observe - Call for any changes  Actinic Damage - Severe, Confluent Chronic Actinic Changes with Pre-Cancerous Actinic Keratoses due to cumulative sun exposure/UV radiation exposure over time - Discussed "Field Treatment" Field treatment involves treatment of an entire area of skin that has confluent Actinic Changes (Sun/ Ultraviolet light damage) and PreCancerous Actinic Keratoses by method of PhotoDynamic Therapy (PDT) and/or prescription Topical Chemotherapy agents such as 5-fluorouracil, 5-fluorouracil/calcipotriene, and/or imiquimod.  The purpose is to decrease the number of clinically evident and subclinical PreCancerous lesions to prevent progression to development of skin cancer by chemically destroying early precancer changes that may or may not be visible.  It has been shown to reduce the risk of developing skin cancer in the treated area. As a result of treatment, redness,  scaling, crusting, and open sores may occur during treatment course. One or more than one of these methods may be used and may have to be used several times to control, suppress and eliminate the PreCancerous changes. Discussed treatment course, expected reaction, and possible side effects. - diffuse scaly erythematous macules with underlying dyspigmentation - Recommend daily broad spectrum sunscreen SPF 30+ to sun-exposed areas, reapply every 2 hours as needed.  - Call for new or changing lesions. - Start prescription 5FU/calcipotriene to tops of hands and forearms twice daily x 7 days then will plan to check patient in clinic. Once arms and hands healing may start treatment to chest. Handout reviewing course of treatment and side effects provided.  Skin cancer screening performed today.  History of Dysplastic Nevi - No evidence of recurrence today at left post waistline, mild atypia - Recommend regular full body skin exams - Recommend daily broad spectrum sunscreen SPF 30+ to sun-exposed areas, reapply every 2 hours as needed.  - Call if any new or changing lesions are noted between office visits  History of Basal Cell Carcinoma of the Skin - No evidence of recurrence today at left bra line infrascapular - Recommend regular full body skin exams - Recommend daily broad spectrum sunscreen SPF 30+ to sun-exposed areas, reapply every 2 hours as needed.  - Call if any new or changing lesions are noted between office visits  History of Squamous Cell Carcinoma of the Skin - No evidence of recurrence today multiple sites - No lymphadenopathy - Recommend regular full body skin exams - Recommend daily broad spectrum sunscreen SPF 30+ to sun-exposed areas, reapply every 2 hours as needed.  - Call if any new or changing lesions are noted between office visits  History of Squamous Cell Carcinoma in Situ of the Skin - No evidence of recurrence today at right medial superior breast - Recommend  regular full body skin exams - Recommend daily broad spectrum sunscreen SPF 30+ to sun-exposed areas, reapply every 2 hours as needed.  - Call if any new or changing lesions are noted between office visits  Return in about 2 weeks (around 06/30/2020) for AK follow up.  Graciella Belton, RMA, am acting as scribe for Forest Gleason, MD .  Documentation: I have reviewed the above documentation for accuracy and completeness, and I agree with the above.  Forest Gleason, MD

## 2020-06-29 ENCOUNTER — Other Ambulatory Visit: Payer: Self-pay

## 2020-06-29 ENCOUNTER — Encounter: Payer: Self-pay | Admitting: Dermatology

## 2020-06-29 ENCOUNTER — Ambulatory Visit: Payer: BC Managed Care – PPO | Admitting: Dermatology

## 2020-06-29 DIAGNOSIS — L578 Other skin changes due to chronic exposure to nonionizing radiation: Secondary | ICD-10-CM

## 2020-06-29 MED ORDER — TRIAMCINOLONE ACETONIDE 0.1 % EX OINT: TOPICAL_OINTMENT | CUTANEOUS | 0 refills | Status: DC

## 2020-06-29 NOTE — Progress Notes (Signed)
Follow-Up Visit   Subjective  [name] is a 64 y.o. female who presents for the following: Follow-up treatment of severe actinic damage with 5-fluorouracil/calcipotriene. She has completed 7 days to her hands and arms, 5 days to areas on the face with significant reaction at the right neck but only a little erythema to affected areas at the arms, hands and left cheek.   She has also been treating her lips with diclofenac for 14 days and has developed scaling and crusting  The following portions of the chart were reviewed this encounter and updated as appropriate:  Tobacco  Allergies  Meds  Problems  Med Hx  Surg Hx  Fam Hx      Objective  Well appearing patient in no apparent distress; mood and affect are within normal limits.  A focused examination was performed including bilateral arms, bilateral hands, face, bilateral ears, lips. Relevant physical exam findings are noted in the Assessment and Plan.  Objective  bilateral forearms and hands, right forehead, left cheek, right neck, lips: diffuse erythematous scaly erythematous macules with underlying dyspigmentation at bilateral forearms and hands, right forehead, left cheek, right neck, lips  Assessment & Plan    Actinic skin damage bilateral forearms and hands, right forehead, left cheek, right neck, lips  Severe, Confluent Chronic Actinic Changes with Pre-Cancerous Actinic Keratoses due to cumulative sun exposure/UV radiation exposure over time - Currently doing prescription "Field Treatment", not yet at goal. Field treatment involves treatment of an entire area of skin that has confluent Actinic Changes (Sun/ Ultraviolet light damage) and PreCancerous Actinic Keratoses by method of PhotoDynamic Therapy (PDT) and/or prescription Topical Chemotherapy agents such as 5-fluorouracil, 5-fluorouracil/calcipotriene, and/or imiquimod.  The purpose is to decrease the number of clinically evident and subclinical PreCancerous  lesions to prevent progression to development of skin cancer by chemically destroying early precancer changes that may or may not be visible.  It has been shown to reduce the risk of developing skin cancer in the treated area. As a result of treatment, redness, scaling, crusting, and open sores may occur during treatment course. One or more than one of these methods may be used and may have to be used several times to control, suppress and eliminate the PreCancerous changes. Discussed treatment course, expected reaction, and possible side effects.  s/p 5-7 days of 5-fluorouracil/calcipotriene with early reaction to face, right neck, arms, and hands  Continue 5FU/calcipotriene treatment for 3 more days to arms and hands until areas become red and crusted. If feeling tender discontinue.  Continue one more day to left cheek and right forehead. Discontinue at right neck.   She has also been using diclofenac topical (prescribed previously) to treat actinic damage at the lips for 2 weeks with good reaction. Recommend discontinue treatment and recheck in 6 weeks. If needed, can treat with 5FU/calcipotriene once per day for 4 days as a repeat field treatment at that time.  Once face, arms and hands healing may start treatment to chest. Plan approximately 10 days thin layer twice a day until areas are red and crusted. Discontinue if tender.   After stopping medicine, if she is hurting or uncomfortable she can use a prescription topical Triamcinolone 0.1% ointment  to help cool things down.  If needed after stopping treatment with fluorouracil, apply Triamcinolone twice a day for 3 days for face and up to 7 days for body areas to treat dermatitis secondary to flurouracil/calcipotriene treatment  Recommend daily broad spectrum sunscreen SPF 30+ to sun-exposed  areas, reapply every 2 hours as needed.   Call for new or changing lesions.   Actinic Keratoses s/p cryotherapy at last visit 2 weeks ago - Will  recheck at follow-up in 6 weeks  Return in about 6 weeks (around 08/10/2020) for ak followup.  I, Ruthell Rummage, CMA, am acting as scribe for Forest Gleason, MD.  Documentation: I have reviewed the above documentation for accuracy and completeness, and I agree with the above.  Forest Gleason, MD

## 2020-06-29 NOTE — Patient Instructions (Addendum)
Melanoma ABCDEs  Melanoma is the most dangerous type of skin cancer, and is the leading cause of death from skin disease.  You are more likely to develop melanoma if you:  Have light-colored skin, light-colored eyes, or red or blond hair  Spend a lot of time in the sun  Tan regularly, either outdoors or in a tanning bed  Have had blistering sunburns, especially during childhood  Have a close family member who has had a melanoma  Have atypical moles or large birthmarks  Early detection of melanoma is key since treatment is typically straightforward and cure rates are extremely high if we catch it early.   The first sign of melanoma is often a change in a mole or a new dark spot.  The ABCDE system is a way of remembering the signs of melanoma.  A for asymmetry:  The two halves do not match. B for border:  The edges of the growth are irregular. C for color:  A mixture of colors are present instead of an even brown color. D for diameter:  Melanomas are usually (but not always) greater than 69mm - the size of a pencil eraser. E for evolution:  The spot keeps changing in size, shape, and color.  Please check your skin once per month between visits. You can use a small mirror in front and a large mirror behind you to keep an eye on the back side or your body.   If you see any new or changing lesions before your next follow-up, please call to schedule a visit.  Please continue daily skin protection including broad spectrum sunscreen SPF 30+ to sun-exposed areas, reapplying every 2 hours as needed when you're outdoors.   Instructions for [facility] Medications  One or more of your medications was sent to the [facility] mail order compounding pharmacy. You will receive an email from them and can purchase the medicine through that link. It will then be mailed to your home at the address you confirmed. If for any reason you do not receive an email from them, please check your spam  folder. If you still do not find the email, please let us know. [facility] phone number is [phone].  5-Fluorouracil/Calcipotriene Patient Education   Actinic keratoses are the dry, red scaly spots on the skin caused by sun damage. A portion of these spots can turn into skin cancer with time, and treating them can help prevent development of skin cancer.   Treatment of these spots requires removal of the defective skin cells. There are various ways to remove actinic keratoses, including freezing with liquid nitrogen, treatment with creams, or treatment with a blue light procedure in the office.   5-fluorouracil cream is a topical cream used to treat actinic keratoses. It works by interfering with the growth of abnormal fast-growing skin cells, such as actinic keratoses. These cells peel off and are replaced by healthy ones.   5-fluorouracil/calcipotriene is a combination of the 5-fluorouracil cream with a vitamin D analog cream called calcipotriene. The calcipotriene alone does not treat actinic keratoses. However, when it is combined with 5-fluorouracil, it helps the 5-fluorouracil treat the actinic keratoses much faster so that the same results can be achieved with a much shorter treatment time.  INSTRUCTIONS FOR 5-FLUOROURACIL/CALCIPOTRIENE CREAM:   5-fluorouracil/calcipotriene cream typically only needs to be used for 4-7 days. A thin layer should be applied twice a day to the treatment areas recommended by your physician.   If your physician prescribed  you separate tubes of 5-fluourouracil and calcipotriene, apply a thin layer of 5-fluorouracil followed by a thin layer of calcipotriene.   Avoid contact with your eyes, nostrils, and mouth. Do not use 5-fluorouracil/calcipotriene cream on infected or open wounds.   You will develop redness, irritation and some crusting at areas where you have pre-cancer damage/actinic keratoses. IF YOU DEVELOP PAIN, BLEEDING, OR SIGNIFICANT CRUSTING,  STOP THE TREATMENT EARLY - you have already gotten a good response and the actinic keratoses should clear up well.  Wash your hands after applying 5-fluorouracil 5% cream on your skin.   A moisturizer or sunscreen with a minimum SPF 30 should be applied each morning.   Once you have finished the treatment, you can apply a thin layer of Vaseline twice a day to irritated areas to soothe and calm the areas more quickly. If you experience significant discomfort, contact your physician.  For some patients it is necessary to repeat the treatment for best results.  SIDE EFFECTS: When using 5-fluorouracil/calcipotriene cream, you may have mild irritation, such as redness, dryness, swelling, or a mild burning sensation. This usually resolves within 2 weeks. The more actinic keratoses you have, the more redness and inflammation you can expect during treatment. Eye irritation has been reported rarely. If this occurs, please let us know.  If you have any trouble using this cream, please call the office. If you have any other questions about this information, please do not hesitate to ask me before you leave the office

## 2020-07-05 ENCOUNTER — Ambulatory Visit: Payer: BC Managed Care – PPO

## 2020-07-14 ENCOUNTER — Encounter: Payer: Self-pay | Admitting: Dermatology

## 2020-07-26 ENCOUNTER — Ambulatory Visit: Payer: BC Managed Care – PPO | Attending: Neurology

## 2020-07-26 DIAGNOSIS — G4733 Obstructive sleep apnea (adult) (pediatric): Secondary | ICD-10-CM | POA: Diagnosis present

## 2020-07-28 ENCOUNTER — Other Ambulatory Visit: Payer: Self-pay

## 2020-08-12 ENCOUNTER — Ambulatory Visit: Payer: BC Managed Care – PPO

## 2020-08-12 ENCOUNTER — Other Ambulatory Visit: Payer: Self-pay

## 2020-08-12 ENCOUNTER — Ambulatory Visit
Admission: RE | Admit: 2020-08-12 | Discharge: 2020-08-12 | Disposition: A | Discharge status: Home / Self Care | Payer: BC Managed Care – PPO | Source: Ambulatory Visit | Attending: Family Medicine | Admitting: Family Medicine

## 2020-08-12 DIAGNOSIS — Z1231 Encounter for screening mammogram for malignant neoplasm of breast: Secondary | ICD-10-CM

## 2020-08-25 ENCOUNTER — Ambulatory Visit: Payer: BC Managed Care – PPO | Admitting: Dermatology

## 2020-08-25 ENCOUNTER — Other Ambulatory Visit: Payer: Self-pay

## 2020-08-25 DIAGNOSIS — L578 Other skin changes due to chronic exposure to nonionizing radiation: Secondary | ICD-10-CM

## 2020-08-25 NOTE — Progress Notes (Signed)
   Follow-Up Visit   Subjective  [name] is a 65 y.o. female who presents for the following: Follow-up (Patient is here today for follow up on aks and actinic skin damage she was treating with 5FU/calcipotriene. She treated face, arms and hands, and then more recently she treated the chest).   She tolerated treatments well.  The following portions of the chart were reviewed this encounter and updated as appropriate:  Tobacco  Allergies  Meds  Problems  Med Hx  Surg Hx  Fam Hx       Objective  Well appearing patient in no apparent distress; mood and affect are within normal limits.  A focused examination was performed including face, chest, arms, hands, feet. Relevant physical exam findings are noted in the Assessment and Plan.   Assessment & Plan    Actinic Damage - Severe, chronic, secondary to cumulative UV radiation exposure over time. Improving but not at goal. Face at goal. Chest, arms, hands, and feet still with diffuse severe actinic damage (feet have not been treated previously) - diffuse scaly erythematous macules and papules with underlying dyspigmentation - Discussed Prescription "Field Treatment" for Severe, Chronic Confluent Actinic Changes with Pre-Cancerous Actinic Keratoses Field treatment involves treatment of an entire area of skin that has confluent Actinic Changes (Sun/ Ultraviolet light damage) and PreCancerous Actinic Keratoses by method of PhotoDynamic Therapy (PDT) and/or prescription Topical Chemotherapy agents such as 5-fluorouracil, 5-fluorouracil/calcipotriene, and/or imiquimod.  The purpose is to decrease the number of clinically evident and subclinical PreCancerous lesions to prevent progression to development of skin cancer by chemically destroying early precancer changes that may or may not be visible.  It has been shown to reduce the risk of developing skin cancer in the treated area. As a result of treatment, redness, scaling, crusting, and  open sores may occur during treatment course. One or more than one of these methods may be used and may have to be used several times to control, suppress and eliminate the PreCancerous changes. Discussed treatment course, expected reaction, and possible side effects. - Recommend daily broad spectrum sunscreen SPF 30+ to sun-exposed areas, reapply every 2 hours as needed.  - Call for new or changing lesions. - After vacation to [place] and [place], [place] prescription 5-fluorouracil/calcipotriene cream twice a day for 7 days to affected areas including hands and arms, feet, and chest. Do a couple of areas at a time, not all at once.  She will call if she needs a refill and will send in the LARGER TUBE prescription to [facility].  - If she is uncomfortable after finishing treatment, ok to use triamcinolone 0.1% ointment twice a day as needed up to 7 days (she has at home)  She prefers to wait for follow-up until Fall. She advises she will monitor her skin at home and call for any new or changing lesions.   Return in about 8 months (around 04/24/2021) for fbse .  I, Asher Muir, CMA, am acting as scribe for Darden Dates, MD.  Documentation: I have reviewed the above documentation for accuracy and completeness, and I agree with the above.  Darden Dates, MD

## 2020-08-25 NOTE — Patient Instructions (Addendum)
Recommend Nicotinamide 500mg  twice per day to lower risk of non-melanoma skin cancer by approximately 25%.   Recommend taking Heliocare sun protection supplement daily in sunny weather for additional sun protection. For maximum protection on the sunniest days, you can take up to 2 capsules of regular Heliocare OR take 1 capsule of Heliocare Ultra. For prolonged exposure (such as a full day in the sun), you can repeat your dose of the supplement 4 hours after your first dose. Heliocare can be purchased at [facility] or at [email or web address].   Recommend daily broad spectrum sunscreen SPF 30+ to sun-exposed areas, reapply every 2 hours as needed. Call for new or changing lesions. 50 + or higher  Face - tinted mineral spf

## 2020-08-27 ENCOUNTER — Encounter: Payer: Self-pay | Admitting: Dermatology

## 2020-08-30 ENCOUNTER — Ambulatory Visit: Payer: BC Managed Care – PPO | Attending: Neurology

## 2020-08-30 DIAGNOSIS — G4733 Obstructive sleep apnea (adult) (pediatric): Secondary | ICD-10-CM | POA: Insufficient documentation

## 2020-09-01 ENCOUNTER — Other Ambulatory Visit: Payer: Self-pay

## 2020-12-03 ENCOUNTER — Other Ambulatory Visit: Payer: Self-pay | Admitting: Family Medicine

## 2020-12-03 ENCOUNTER — Ambulatory Visit
Admission: RE | Admit: 2020-12-03 | Discharge: 2020-12-03 | Disposition: A | Discharge status: Home / Self Care | Payer: BC Managed Care – PPO | Source: Ambulatory Visit | Attending: Family Medicine | Admitting: Family Medicine

## 2020-12-03 DIAGNOSIS — R0789 Other chest pain: Secondary | ICD-10-CM

## 2021-04-21 ENCOUNTER — Other Ambulatory Visit: Payer: Self-pay

## 2021-04-21 ENCOUNTER — Ambulatory Visit: Payer: PPO | Admitting: Dermatology

## 2021-04-21 DIAGNOSIS — E2839 Other primary ovarian failure: Secondary | ICD-10-CM | POA: Diagnosis not present

## 2021-04-21 DIAGNOSIS — L578 Other skin changes due to chronic exposure to nonionizing radiation: Secondary | ICD-10-CM

## 2021-04-21 DIAGNOSIS — Z8616 Personal history of COVID-19: Secondary | ICD-10-CM | POA: Diagnosis not present

## 2021-04-21 DIAGNOSIS — R6882 Decreased libido: Secondary | ICD-10-CM | POA: Diagnosis not present

## 2021-04-21 DIAGNOSIS — L570 Actinic keratosis: Secondary | ICD-10-CM

## 2021-04-21 DIAGNOSIS — I10 Essential (primary) hypertension: Secondary | ICD-10-CM | POA: Diagnosis not present

## 2021-04-21 DIAGNOSIS — L814 Other melanin hyperpigmentation: Secondary | ICD-10-CM

## 2021-04-21 DIAGNOSIS — E7800 Pure hypercholesterolemia, unspecified: Secondary | ICD-10-CM | POA: Diagnosis not present

## 2021-04-21 DIAGNOSIS — K219 Gastro-esophageal reflux disease without esophagitis: Secondary | ICD-10-CM | POA: Diagnosis not present

## 2021-04-21 DIAGNOSIS — R0781 Pleurodynia: Secondary | ICD-10-CM | POA: Diagnosis not present

## 2021-04-21 DIAGNOSIS — Z1159 Encounter for screening for other viral diseases: Secondary | ICD-10-CM | POA: Diagnosis not present

## 2021-04-21 DIAGNOSIS — Z86018 Personal history of other benign neoplasm: Secondary | ICD-10-CM | POA: Diagnosis not present

## 2021-04-21 DIAGNOSIS — Z1283 Encounter for screening for malignant neoplasm of skin: Secondary | ICD-10-CM

## 2021-04-21 DIAGNOSIS — D229 Melanocytic nevi, unspecified: Secondary | ICD-10-CM

## 2021-04-21 DIAGNOSIS — Z85828 Personal history of other malignant neoplasm of skin: Secondary | ICD-10-CM | POA: Diagnosis not present

## 2021-04-21 DIAGNOSIS — Z1389 Encounter for screening for other disorder: Secondary | ICD-10-CM | POA: Diagnosis not present

## 2021-04-21 DIAGNOSIS — L821 Other seborrheic keratosis: Secondary | ICD-10-CM | POA: Diagnosis not present

## 2021-04-21 DIAGNOSIS — D1800 Hemangioma unspecified site: Secondary | ICD-10-CM

## 2021-04-21 DIAGNOSIS — Z23 Encounter for immunization: Secondary | ICD-10-CM | POA: Diagnosis not present

## 2021-04-21 DIAGNOSIS — Z1211 Encounter for screening for malignant neoplasm of colon: Secondary | ICD-10-CM | POA: Diagnosis not present

## 2021-04-21 DIAGNOSIS — Z0000 Encounter for general adult medical examination without abnormal findings: Secondary | ICD-10-CM | POA: Diagnosis not present

## 2021-04-21 NOTE — Progress Notes (Signed)
Follow-Up Visit   Subjective  [name] is a 65 y.o. female who presents for the following: Total body skin exam (Hx of BCC L braline infrascapular, Hx of Dysplastic nevus L post waistline, hx of SCCs multiple) and Actinic Keratosis (Hands, arms, feet, chest, lips, 38fu/calcipotriene 10/2020, did arms, chest several times).  The patient presents for Total-Body Skin Exam (TBSE) for skin cancer screening and mole check.   The following portions of the chart were reviewed this encounter and updated as appropriate:   Tobacco  Allergies  Meds  Problems  Med Hx  Surg Hx  Fam Hx      Review of Systems:  No other skin or systemic complaints except as noted in HPI or Assessment and Plan.  Objective  Well appearing patient in no apparent distress; mood and affect are within normal limits.  A full examination was performed including scalp, head, eyes, ears, nose, lips, neck, chest, axillae, abdomen, back, buttocks, bilateral upper extremities, bilateral lower extremities, hands, feet, fingers, toes, fingernails, and toenails. All findings within normal limits unless otherwise noted below.  R second finger x 1, L frontal scalp x 1, upper back L of midline x 1, R thumb x 1 (4) Pink scaly macules    Assessment & Plan  AK (actinic keratosis) (4) R second finger x 1, L frontal scalp x 1, upper back L of midline x 1, R thumb x 1  Actinic keratoses are precancerous spots that appear secondary to cumulative UV radiation exposure/sun exposure over time. They are chronic with expected duration over 1 year. A portion of actinic keratoses will progress to squamous cell carcinoma of the skin. It is not possible to reliably predict which spots will progress to skin cancer and so treatment is recommended to prevent development of skin cancer.  Recommend daily broad spectrum sunscreen SPF 30+ to sun-exposed areas, reapply every 2 hours as needed.  Recommend staying in the shade or wearing long  sleeves, sun glasses (UVA+UVB protection) and wide brim hats (4-inch brim around the entire circumference of the hat). Call for new or changing lesions.  Discussed LN2 vs 5-FU/calcipotriene. Pt prefers 5FU/calcipotriene  - Start 5-fluorouracil/calcipotriene cream twice a day for 7 days to affected areas including bil hands/arms/shoulders, chest, bil ant lower legs, left calf, top of feet . Start 5FU/Calcipotriene cr bid x 4 days to R eyebrow.  Discussed with patient about treating one area at a time.  Prescription sent to [facility]. Patient advised they will receive an email to purchase the medication online and have it sent to their home. Patient provided with handout reviewing treatment course and side effects and advised to call or message [place] on MyChart with any concerns.   Recommend taking Heliocare sun protection supplement daily in sunny weather for additional sun protection. For maximum protection on the sunniest days, you can take up to 2 capsules of regular Heliocare OR take 1 capsule of Heliocare Ultra. For prolonged exposure (such as a full day in the sun), you can repeat your dose of the supplement 4 hours after your first dose.   Destruction of lesion - R second finger x 1, L frontal scalp x 1, upper back L of midline x 1, R thumb x 1  Destruction method: cryotherapy   Informed consent: discussed and consent obtained   Lesion destroyed using liquid nitrogen: Yes   Region frozen until ice ball extended beyond lesion: Yes   Outcome: patient tolerated procedure well with no  complications   Post-procedure details: wound care instructions given   Additional details:  Prior to procedure, discussed risks of blister formation, small wound, skin dyspigmentation, or rare scar following cryotherapy. Recommend Vaseline ointment to treated areas while healing.   Lentigines - Scattered tan macules - Due to sun exposure - Benign-appearing, observe - Recommend daily  broad spectrum sunscreen SPF 30+ to sun-exposed areas, reapply every 2 hours as needed. - Call for any changes  Seborrheic Keratoses - Stuck-on, waxy, tan-brown papules and/or plaques  - Benign-appearing - Discussed benign etiology and prognosis. - Observe - Call for any changes  Melanocytic Nevi - Tan-brown and/or pink-flesh-colored symmetric macules and papules - Benign appearing on exam today - Observation - Call clinic for new or changing moles - Recommend daily use of broad spectrum spf 30+ sunscreen to sun-exposed areas.   Hemangiomas - Red papules - Discussed benign nature - Observe - Call for any changes  Actinic Damage - Severe, confluent actinic changes with pre-cancerous actinic keratoses  - Severe, chronic, not at goal, secondary to cumulative UV radiation exposure over time - diffuse scaly erythematous macules and papules with underlying dyspigmentation - Discussed Prescription "Field Treatment" for Severe, Chronic Confluent Actinic Changes with Pre-Cancerous Actinic Keratoses Field treatment involves treatment of an entire area of skin that has confluent Actinic Changes (Sun/ Ultraviolet light damage) and PreCancerous Actinic Keratoses by method of PhotoDynamic Therapy (PDT) and/or prescription Topical Chemotherapy agents such as 5-fluorouracil, 5-fluorouracil/calcipotriene, and/or imiquimod.  The purpose is to decrease the number of clinically evident and subclinical PreCancerous lesions to prevent progression to development of skin cancer by chemically destroying early precancer changes that may or may not be visible.  It has been shown to reduce the risk of developing skin cancer in the treated area. As a result of treatment, redness, scaling, crusting, and open sores may occur during treatment course. One or more than one of these methods may be used and may have to be used several times to control, suppress and eliminate the PreCancerous changes. Discussed treatment  course, expected reaction, and possible side effects. - Recommend daily broad spectrum sunscreen SPF 30+ to sun-exposed areas, reapply every 2 hours as needed.  - Staying in the shade or wearing long sleeves, sun glasses (UVA+UVB protection) and wide brim hats (4-inch brim around the entire circumference of the hat) are also recommended. - Call for new or changing lesions.  - Start 5-fluorouracil/calcipotriene cream twice a day for 7 days to affected areas including bil hands/arms/shoulders, chest, bil ant lower legs, left calf, top of feet . Start 5FU/Calcipotriene cr bid x 4 days to R eyebrow.  Discussed with patient about treating one area at a time.  Prescription sent to [facility]. Patient advised they will receive an email to purchase the medication online and have it sent to their home. Patient provided with handout reviewing treatment course and side effects and advised to call or message [place] on MyChart with any concerns.  - Recommend taking Heliocare sun protection supplement daily in sunny weather for additional sun protection. For maximum protection on the sunniest days, you can take up to 2 capsules of regular Heliocare OR take 1 capsule of Heliocare Ultra. For prolonged exposure (such as a full day in the sun), you can repeat your dose of the supplement 4 hours after your first dose.   Skin cancer screening performed today.  History of Basal Cell Carcinoma of the Skin - No evidence of recurrence today - Recommend regular  full body skin exams - Recommend daily broad spectrum sunscreen SPF 30+ to sun-exposed areas, reapply every 2 hours as needed.  - Call if any new or changing lesions are noted between office visits  - L braline infrascapular  History of Dysplastic Nevi - No evidence of recurrence today - Recommend regular full body skin exams - Recommend daily broad spectrum sunscreen SPF 30+ to sun-exposed areas, reapply every 2 hours as needed.  - Call if  any new or changing lesions are noted between office visits  - L post waistline  History of Squamous Cell Carcinoma of the Skin - No evidence of recurrence today - No lymphadenopathy - Recommend regular full body skin exams - Recommend daily broad spectrum sunscreen SPF 30+ to sun-exposed areas, reapply every 2 hours as needed.  - Call if any new or changing lesions are noted between office visits - multiple - Cont Niacinamide 500 mg twice a day  Return in about 1 year (around 04/21/2022) for TBSE.  I, Othelia Pulling, RMA, am acting as scribe for Forest Gleason, MD .  Documentation: I have reviewed the above documentation for accuracy and completeness, and I agree with the above.  Forest Gleason, MD

## 2021-04-21 NOTE — Patient Instructions (Addendum)
If you have any questions or concerns for your doctor, please call our main line at [phone] and press option 4 to reach your doctor's medical assistant. If no one answers, please leave a voicemail as directed and we will return your call as soon as possible. Messages left after 4 pm will be answered the following business day.   You may also send [place] a message via [place]. We typically respond to MyChart messages within 1-2 business days.  For prescription refills, please ask your pharmacy to contact our office. Our fax number is [phone].  If you have an urgent issue when the clinic is closed that cannot wait until the next business day, you can page your doctor at the number below.    Please note that while we do our best to be available for urgent issues outside of office hours, we are not available 24/7.   If you have an urgent issue and are unable to reach [place], you may choose to seek medical care at your doctor's office, retail clinic, urgent care center, or emergency room.  If you have a medical emergency, please immediately call 911 or go to the emergency department.  Pager Numbers  - Dr. Nehemiah Massed: [phone]  - Dr. Laurence Ferrari: [phone]  - Dr. Nicole Kindred: [phone]  In the event of inclement weather, please call our main line at [phone] for an update on the status of any delays or closures.  Dermatology Medication Tips: Please keep the boxes that topical medications come in in order to help keep track of the instructions about where and how to use these. Pharmacies typically print the medication instructions only on the boxes and not directly on the medication tubes.   If your medication is too expensive, please contact our office at [phone] option 4 or send [place] a message through [place].   We are unable to tell what your co-pay for medications will be in advance as this is different depending on your insurance coverage. However, we may be able to find a substitute  medication at lower cost or fill out paperwork to get insurance to cover a needed medication.   If a prior authorization is required to get your medication covered by your insurance company, please allow [place] 1-2 business days to complete this process.  Drug prices often vary depending on where the prescription is filled and some pharmacies may offer cheaper prices.  The website www.goodrx.com contains coupons for medications through different pharmacies. The prices here do not account for what the cost may be with help from insurance (it may be cheaper with your insurance), but the website can give you the price if you did not use any insurance.  - You can print the associated coupon and take it with your prescription to the pharmacy.  - You may also stop by our office during regular business hours and pick up a GoodRx coupon card.  - If you need your prescription sent electronically to a different pharmacy, notify our office through [facility] or by phone at [phone] option 4.   - Start 5-fluorouracil/calcipotriene cream twice a day for 7 days to affected areas including both hands/arms/shoulders, chest, both lower legs front side only, left calf,  tops of both feet. Use the cream on the Right eyebrow area twice a day for 4 days.  Prescription sent to [facility]. Patient advised they will receive an email to purchase the medication online and have it sent to their home. Patient provided with  handout reviewing treatment course and side effects and advised to call or message [place] on MyChart with any concerns.   Instructions for [facility]  One or more of your medications was sent to the [facility]. You will receive an email from them and can purchase the medicine through that link. It will then be mailed to your home at the address you confirmed. If for any reason you do not receive an email from them, please  check your spam folder. If you still do not find the email, please let us know. [facility] phone number is [phone].

## 2021-04-25 ENCOUNTER — Other Ambulatory Visit: Payer: Self-pay | Admitting: Family Medicine

## 2021-04-25 DIAGNOSIS — Z1231 Encounter for screening mammogram for malignant neoplasm of breast: Secondary | ICD-10-CM

## 2021-04-27 ENCOUNTER — Other Ambulatory Visit: Payer: Self-pay | Admitting: Family Medicine

## 2021-04-27 DIAGNOSIS — E2839 Other primary ovarian failure: Secondary | ICD-10-CM

## 2021-05-04 ENCOUNTER — Encounter: Payer: Self-pay | Admitting: Dermatology

## 2021-06-01 DIAGNOSIS — H26492 Other secondary cataract, left eye: Secondary | ICD-10-CM | POA: Diagnosis not present

## 2021-06-01 DIAGNOSIS — H2511 Age-related nuclear cataract, right eye: Secondary | ICD-10-CM | POA: Diagnosis not present

## 2021-06-01 DIAGNOSIS — H5203 Hypermetropia, bilateral: Secondary | ICD-10-CM | POA: Diagnosis not present

## 2021-06-01 DIAGNOSIS — H35342 Macular cyst, hole, or pseudohole, left eye: Secondary | ICD-10-CM | POA: Diagnosis not present

## 2021-06-07 DIAGNOSIS — G4733 Obstructive sleep apnea (adult) (pediatric): Secondary | ICD-10-CM | POA: Diagnosis not present

## 2021-07-20 ENCOUNTER — Ambulatory Visit: Payer: BC Managed Care – PPO | Admitting: Dermatology

## 2021-08-03 ENCOUNTER — Ambulatory Visit (INDEPENDENT_AMBULATORY_CARE_PROVIDER_SITE_OTHER): Payer: PPO | Admitting: Dermatology

## 2021-08-03 ENCOUNTER — Encounter: Payer: Self-pay | Admitting: Dermatology

## 2021-08-03 ENCOUNTER — Other Ambulatory Visit: Payer: Self-pay

## 2021-08-03 DIAGNOSIS — L820 Inflamed seborrheic keratosis: Secondary | ICD-10-CM | POA: Diagnosis not present

## 2021-08-03 DIAGNOSIS — L570 Actinic keratosis: Secondary | ICD-10-CM | POA: Diagnosis not present

## 2021-08-03 DIAGNOSIS — L281 Prurigo nodularis: Secondary | ICD-10-CM

## 2021-08-03 DIAGNOSIS — L219 Seborrheic dermatitis, unspecified: Secondary | ICD-10-CM

## 2021-08-03 MED ORDER — CLOBETASOL PROPIONATE 0.05 % EX CREA: TOPICAL_CREAM | CUTANEOUS | 1 refills | Status: DC

## 2021-08-03 NOTE — Progress Notes (Signed)
Follow-Up Visit   Subjective  [name] is a 66 y.o. female who presents for the following: lesions (Check spots: face, arms, thighs. Has used 5FU/Cacipotriene ([facility]) on chest and arms for 7.5 days, finished Sunday morning. "The cream ran out." She is treating specific areas at a time as directed by Dr. Laurence Ferrari. She would like to know if she needs to continue past the 7 days, did not have irritation until day 5.).  She has multiple irritated spots she wants checked/treated today.    The following portions of the chart were reviewed this encounter and updated as appropriate:      Review of Systems: No other skin or systemic complaints except as noted in HPI or Assessment and Plan.   Objective  Well appearing patient in no apparent distress; mood and affect are within normal limits.  A full examination was performed including scalp, head, eyes, ears, nose, lips, neck, chest, axillae, abdomen, back, buttocks, bilateral upper extremities, bilateral lower extremities, hands, feet, fingers, toes, fingernails, and toenails. All findings within normal limits unless otherwise noted below.  Right mid Forearm x1, left medial knee x1, right lateral lower leg x1, right medial breast x1, left anterior ankle x1 (5) Erythematous keratotic or waxy stuck-on papules  Left Thigh - Anterior Lichenified flesh firm nodule at left thigh. Scattered pink firm scaly papules at left medial ankle.   upper sternum x1, right hand dorsum x1, left thumb x1 (3) Erythematous thin papules/macules with gritty scale.  Mild erythema with few scattered pick/red macules in treated 5FU/VitD areas- mild reaction    Left paranasal Pink scaliness   Assessment & Plan  Inflamed seborrheic keratosis (5) Right mid Forearm x1, left medial knee x1, right lateral lower leg x1, right medial breast x1, left anterior ankle x1  Discussed benign etiology and prognosis.   Destruction of lesion - Right mid Forearm  x1, left medial knee x1, right lateral lower leg x1, right medial breast x1, left anterior ankle x1  Destruction method: cryotherapy   Informed consent: discussed and consent obtained   Lesion destroyed using liquid nitrogen: Yes   Region frozen until ice ball extended beyond lesion: Yes   Outcome: patient tolerated procedure well with no complications   Post-procedure details: wound care instructions given   Additional details:  Prior to procedure, discussed risks of blister formation, small wound, skin dyspigmentation, or rare scar following cryotherapy. Recommend Vaseline ointment to treated areas while healing.   Prurigo nodularis Left Thigh - Anterior  Clobetasol cream BID up to 2-4 weeks PRN to affected areas on left thigh and left ankle. Avoid applying to face, groin, and axilla. Use as directed. Long-term use can cause thinning of the skin.  Avoid picking/scratching/rubbing  Consider cryotherapy to L thigh lesion if not improving with topical  Topical steroids (such as triamcinolone, fluocinolone, fluocinonide, mometasone, clobetasol, halobetasol, betamethasone, hydrocortisone) can cause thinning and lightening of the skin if they are used for too long in the same area. Your physician has selected the right strength medicine for your problem and area affected on the body. Please use your medication only as directed by your physician to prevent side effects.     clobetasol cream (TEMOVATE) 0.05 % - Left Thigh - Anterior Apply twice daily up to 2 weeks to affected area on left thigh and left ankle. Avoid applying to face, groin, and axilla.  AK (actinic keratosis) (3) upper sternum x1, right hand dorsum x1, left thumb x1  Actinic keratoses are  precancerous spots that appear secondary to cumulative UV radiation exposure/sun exposure over time. They are chronic with expected duration over 1 year. A portion of actinic keratoses will progress to squamous cell carcinoma of the skin. It  is not possible to reliably predict which spots will progress to skin cancer and so treatment is recommended to prevent development of skin cancer.  Recommend daily broad spectrum sunscreen SPF 30+ to sun-exposed areas, reapply every 2 hours as needed.  Recommend staying in the shade or wearing long sleeves, sun glasses (UVA+UVB protection) and wide brim hats (4-inch brim around the entire circumference of the hat). Call for new or changing lesions.  Continue 5FU/Calcipotriene treatment regimen as directed by Dr. Laurence Ferrari. Recommend continuing bid for another week.  Pt has rfs of [facility] gm at [facility]  Destruction of lesion - upper sternum x1, right hand dorsum x1, left thumb x1  Destruction method: cryotherapy   Informed consent: discussed and consent obtained   Lesion destroyed using liquid nitrogen: Yes   Region frozen until ice ball extended beyond lesion: Yes   Outcome: patient tolerated procedure well with no complications   Post-procedure details: wound care instructions given   Additional details:  Prior to procedure, discussed risks of blister formation, small wound, skin dyspigmentation, or rare scar following cryotherapy. Recommend Vaseline ointment to treated areas while healing.   Seborrheic dermatitis Left paranasal  Vs ISK  Start OTC HC 1% cream twice daily up to 1 week as needed. Recheck at follow-up with Dr. Laurence Ferrari.  If not improved, consider cryotherapy   Return for TBSE As Scheduled with Dr. Laurence Ferrari.  I, Emelia Salisbury, CMA, am acting as scribe for Brendolyn Patty, MD.  Documentation: I have reviewed the above documentation for accuracy and completeness, and I agree with the above.  Brendolyn Patty MD

## 2021-08-03 NOTE — Patient Instructions (Addendum)
Cryotherapy Aftercare  Wash gently with soap and water everyday.   Apply Vaseline and Band-Aid daily until healed.   Prior to procedure, discussed risks of blister formation, small wound, skin dyspigmentation, or rare scar following cryotherapy. Recommend Vaseline ointment to treated areas while healing.   Instructions for [facility] Medications  One or more of your medications was sent to the [facility] mail order compounding pharmacy. You will receive an email from them and can purchase the medicine through that link. It will then be mailed to your home at the address you confirmed. If for any reason you do not receive an email from them, please check your spam folder. If you still do not find the email, please let us know. [facility] phone number is [phone].  Ask for the prescription that was sent in by Dr. Laurence Ferrari in April 21, 2021 for 60gm tube. Continue treatment as directed by Dr. Laurence Ferrari.  Clobetasol cream twice daily up to 2 weeks as needed,  to affected areas on left thigh and left ankle. Avoid applying to face, groin, and axilla. Use as directed. Long-term use can cause thinning of the skin.  Start over the counter Hydrocortisone 1% cream twice daily up to 1 week as needed to area on face.  Topical steroids (such as triamcinolone, fluocinolone, fluocinonide, mometasone, clobetasol, halobetasol, betamethasone, hydrocortisone) can cause thinning and lightening of the skin if they are used for too long in the same area. Your physician has selected the right strength medicine for your problem and area affected on the body. Please use your medication only as directed by your physician to prevent side effects.    If You Need Anything After Your Visit  If you have any questions or concerns for your doctor, please call our main line at [phone] and press option 4 to reach your doctor's medical assistant. If no one answers, please leave a voicemail as directed and we will  return your call as soon as possible. Messages left after 4 pm will be answered the following business day.   You may also send [place] a message via [place]. We typically respond to MyChart messages within 1-2 business days.  For prescription refills, please ask your pharmacy to contact our office. Our fax number is [phone].  If you have an urgent issue when the clinic is closed that cannot wait until the next business day, you can page your doctor at the number below.    Please note that while we do our best to be available for urgent issues outside of office hours, we are not available 24/7.   If you have an urgent issue and are unable to reach [place], you may choose to seek medical care at your doctor's office, retail clinic, urgent care center, or emergency room.  If you have a medical emergency, please immediately call 911 or go to the emergency department.  Pager Numbers  - Dr. Nehemiah Massed: [phone]  - Dr. Laurence Ferrari: [phone]  - Dr. Nicole Kindred: [phone]  In the event of inclement weather, please call our main line at [phone] for an update on the status of any delays or closures.  Dermatology Medication Tips: Please keep the boxes that topical medications come in in order to help keep track of the instructions about where and how to use these. Pharmacies typically print the medication instructions only on the boxes and not directly on the medication tubes.   If your medication is too expensive, please contact our office at [phone] option 4  or send [place] a message through [place].   We are unable to tell what your co-pay for medications will be in advance as this is different depending on your insurance coverage. However, we may be able to find a substitute medication at lower cost or fill out paperwork to get insurance to cover a needed medication.   If a prior authorization is required to get your medication covered by your insurance company, please allow [place] 1-2 business  days to complete this process.  Drug prices often vary depending on where the prescription is filled and some pharmacies may offer cheaper prices.  The website www.goodrx.com contains coupons for medications through different pharmacies. The prices here do not account for what the cost may be with help from insurance (it may be cheaper with your insurance), but the website can give you the price if you did not use any insurance.  - You can print the associated coupon and take it with your prescription to the pharmacy.  - You may also stop by our office during regular business hours and pick up a GoodRx coupon card.  - If you need your prescription sent electronically to a different pharmacy, notify our office through [facility] or by phone at [phone] option 4.     Si Usted Necesita Algo Despus de Su Visita  Tambin puede enviarnos un mensaje a travs de Pharmacist, community. Por lo general respondemos a los mensajes de MyChart en el transcurso de 1 a 2 das hbiles.  Para renovar recetas, por favor pida a su farmacia que se ponga en contacto con nuestra oficina. Harland Dingwall de fax es [place] [phone].  Si tiene un asunto urgente cuando la clnica est cerrada y que no puede esperar hasta el siguiente da hbil, puede llamar/localizar a su doctor(a) al nmero que aparece a continuacin.   Por favor, tenga en cuenta que aunque hacemos todo lo posible para estar disponibles para asuntos urgentes fuera del horario de [place], no estamos disponibles las 24 horas del da, los 7 das de la [place].   Si tiene un problema urgente y no puede comunicarse con nosotros, puede optar por buscar atencin mdica  en el consultorio de su doctor(a), en una clnica privada, en un centro de atencin urgente o en una sala de emergencias.  Si tiene Engineering geologist, por favor llame inmediatamente al 911 o vaya a la sala de emergencias.  Nmeros de bper  - Dr. Nehemiah Massed: [phone]  - Dra. Moye:  [phone]  - Dra. Nicole Kindred: [phone]  En caso de inclemencias del [place], por favor llame a Johnsie Kindred principal al [phone] para una actualizacin sobre el [place] de cualquier retraso o cierre.  Consejos para la medicacin en dermatologa: Por favor, guarde las cajas en las que vienen los medicamentos de uso tpico para ayudarle a seguir las instrucciones sobre dnde y cmo usarlos. Las farmacias generalmente imprimen las instrucciones del medicamento slo en las cajas y no directamente en los tubos del [place].   Si su medicamento es muy caro, por favor, pngase en contacto con Zigmund Daniel llamando al [phone] y presione la opcin 4 o envenos un mensaje a travs de Pharmacist, community.   No podemos decirle cul ser su copago por los medicamentos por adelantado ya que esto es diferente dependiendo de la cobertura de su seguro. Sin embargo, es posible que podamos encontrar un medicamento sustituto a Electrical engineer un formulario para que el seguro cubra el medicamento que se considera necesario.  Si se requiere una autorizacin previa para que su compaa de seguros [place] su medicamento, por favor permtanos de 1 a 2 das hbiles para completar este proceso.  Los precios de los medicamentos varan con frecuencia dependiendo del Environmental consultant de dnde se surte la receta y alguna farmacias pueden ofrecer precios ms baratos.  El sitio web www.goodrx.com tiene cupones para medicamentos de Airline pilot. Los precios aqu no tienen en cuenta lo que podra costar con la ayuda del seguro (puede ser ms barato con su seguro), pero el sitio web puede darle el precio si no utiliz Research scientist (physical sciences).  - Puede imprimir el cupn correspondiente y llevarlo con su receta a la farmacia.  - Tambin puede pasar por nuestra oficina durante el horario de atencin regular y Charity fundraiser una tarjeta de cupones de GoodRx.  - Si necesita que su receta se enve electrnicamente a una farmacia diferente,  informe a nuestra oficina a travs de MyChart de [place] o por telfono llamando al [phone] y presione la opcin 4.

## 2021-08-10 DIAGNOSIS — K648 Other hemorrhoids: Secondary | ICD-10-CM | POA: Diagnosis not present

## 2021-08-10 DIAGNOSIS — R197 Diarrhea, unspecified: Secondary | ICD-10-CM | POA: Diagnosis not present

## 2021-08-15 ENCOUNTER — Ambulatory Visit
Admission: RE | Admit: 2021-08-15 | Discharge: 2021-08-15 | Disposition: A | Discharge status: Home / Self Care | Payer: PPO | Source: Ambulatory Visit | Attending: Family Medicine | Admitting: Family Medicine

## 2021-08-15 DIAGNOSIS — Z1231 Encounter for screening mammogram for malignant neoplasm of breast: Secondary | ICD-10-CM | POA: Diagnosis not present

## 2021-08-28 IMAGING — MG MM DIGITAL SCREENING BILAT W/ TOMO AND CAD
8 series · 9 of 24 positions shown · non-contrast
Comparison: Previous exam(s).

CLINICAL DATA: Screening.

EXAM:
DIGITAL SCREENING BILATERAL MAMMOGRAM WITH TOMO AND CAD

[R MLO synth-2D]
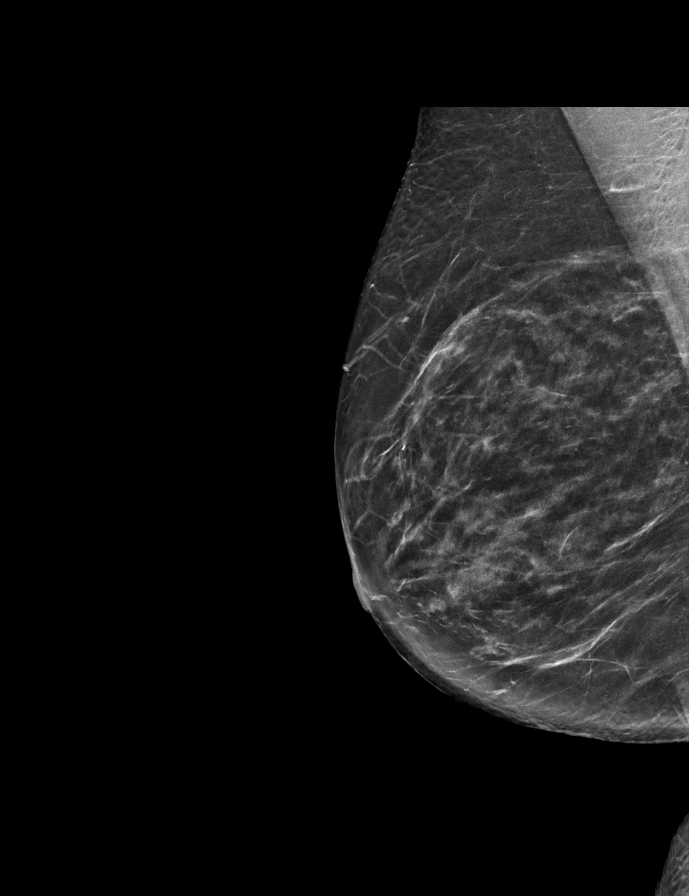

[R CC synth-2D]
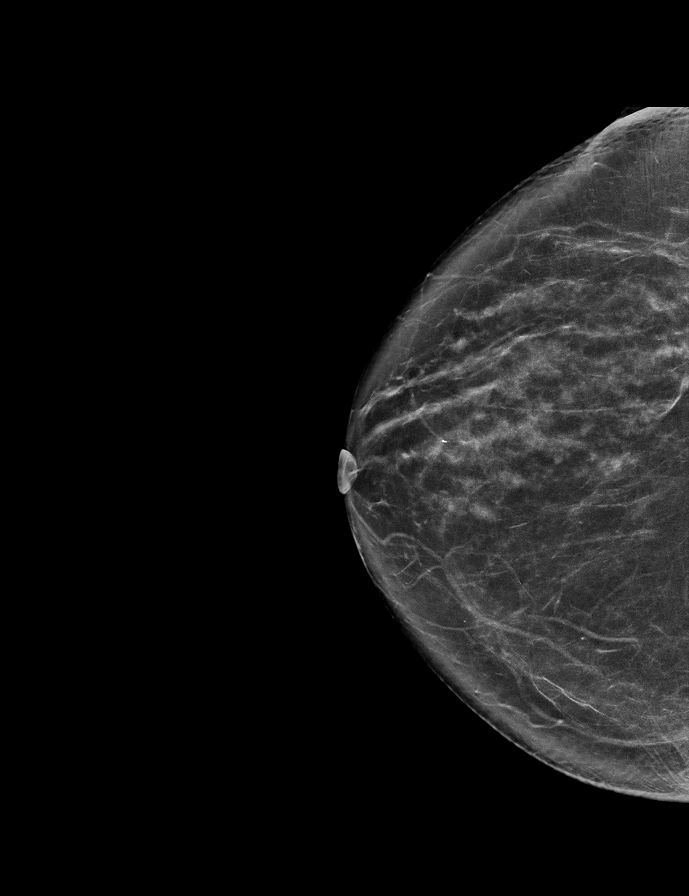

[L MLO synth-2D]
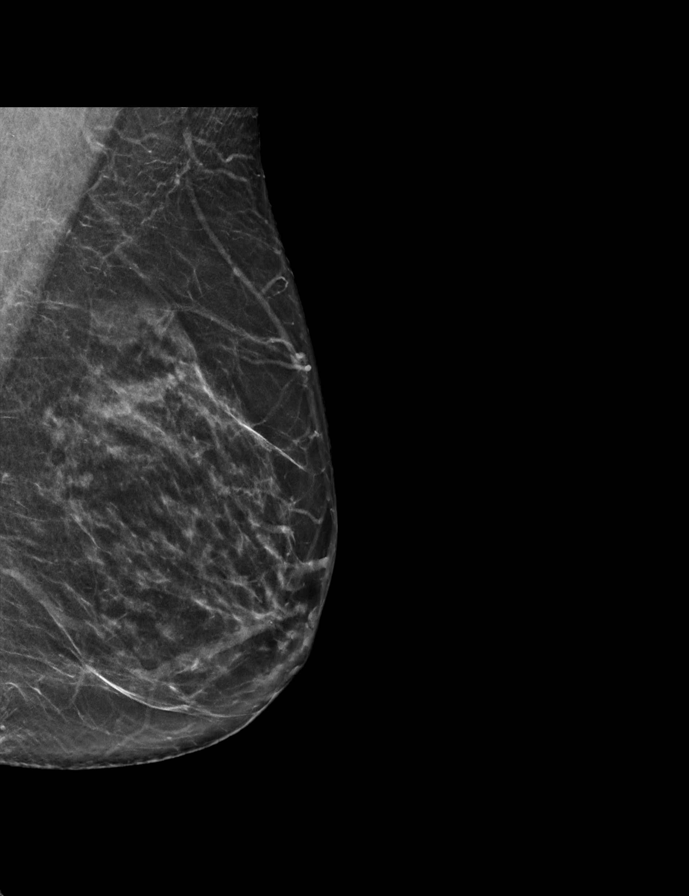

[L CC synth-2D]
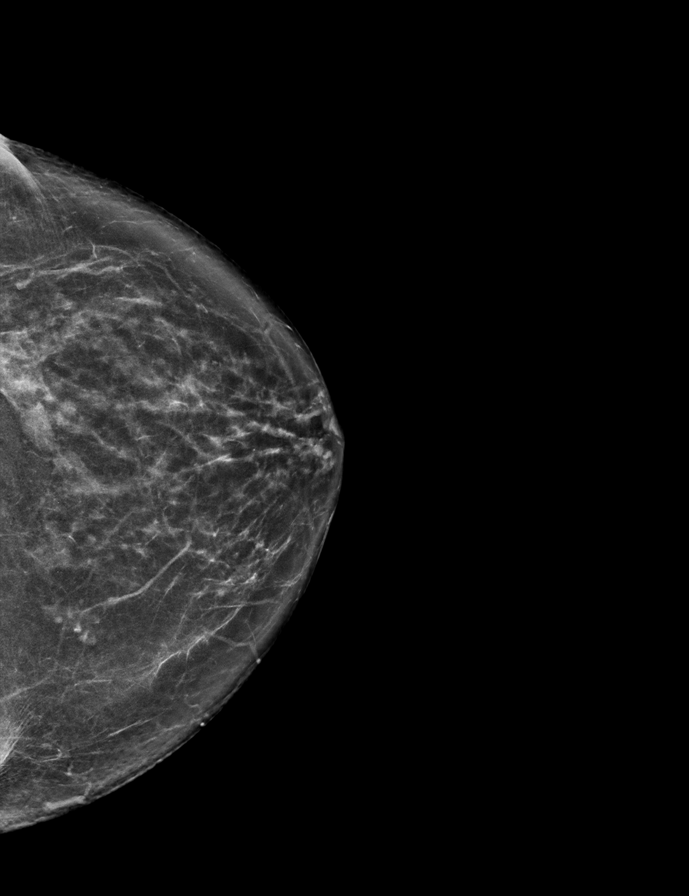

[R MLO tomo · 2 of 68 frames shown]
[frame 22/68]
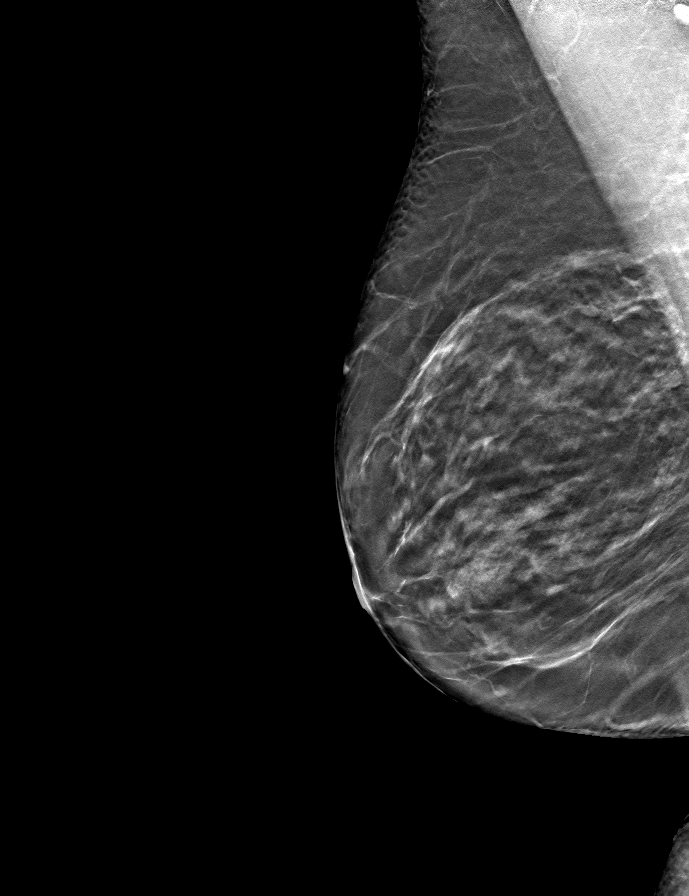
[frame 35/68]
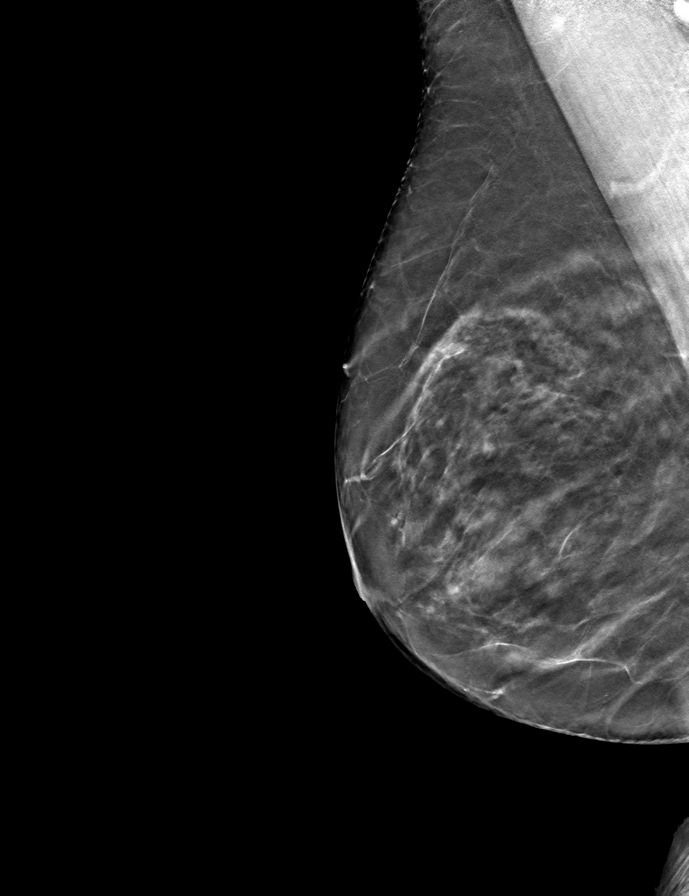

[L MLO tomo · tomo slice 34/67.0]
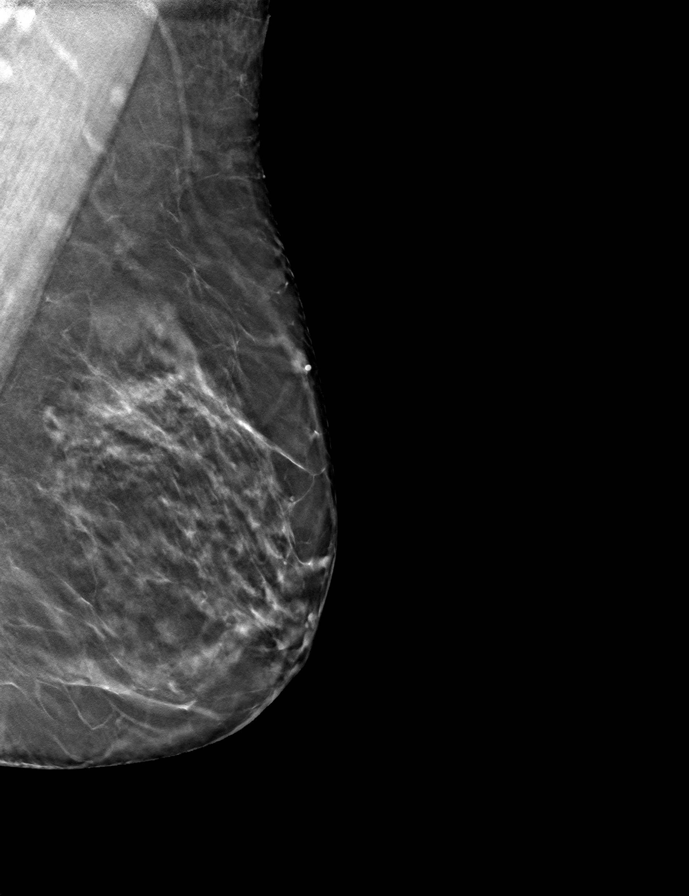

[R CC tomo · tomo slice 36/71.0]
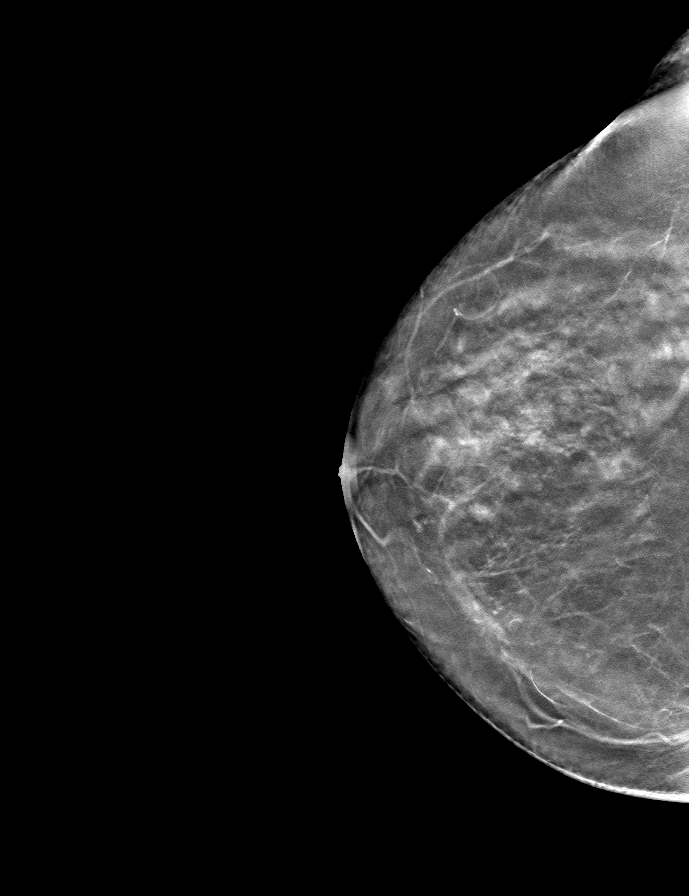

[L CC tomo · tomo slice 33/65.0]
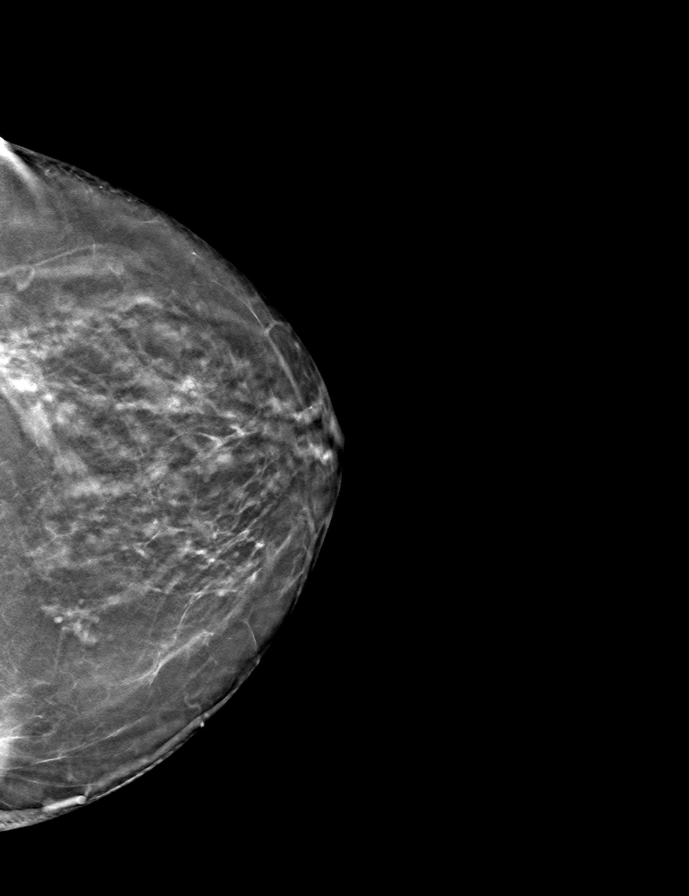

[9 of 24 positions shown; findings below may reference images not displayed]

ACR Breast Density Category b: There are scattered areas of
fibroglandular density.
FINDINGS: There are no findings suspicious for malignancy. The images were
evaluated with computer-aided detection.
IMPRESSION: No mammographic evidence of malignancy. A result letter of this
screening mammogram will be mailed directly to the patient.

RECOMMENDATION:
Screening mammogram in one year. (Code:ZP-7-VX7)

BI-RADS CATEGORY  1: Negative.

## 2021-09-28 DIAGNOSIS — G4733 Obstructive sleep apnea (adult) (pediatric): Secondary | ICD-10-CM | POA: Diagnosis not present

## 2021-10-10 DIAGNOSIS — K529 Noninfective gastroenteritis and colitis, unspecified: Secondary | ICD-10-CM | POA: Diagnosis not present

## 2021-10-10 DIAGNOSIS — R140 Abdominal distension (gaseous): Secondary | ICD-10-CM | POA: Diagnosis not present

## 2021-10-10 DIAGNOSIS — K625 Hemorrhage of anus and rectum: Secondary | ICD-10-CM | POA: Diagnosis not present

## 2021-10-12 ENCOUNTER — Ambulatory Visit
Admission: RE | Admit: 2021-10-12 | Discharge: 2021-10-12 | Disposition: A | Discharge status: Home / Self Care | Payer: PPO | Source: Ambulatory Visit | Attending: Family Medicine | Admitting: Family Medicine

## 2021-10-12 DIAGNOSIS — E2839 Other primary ovarian failure: Secondary | ICD-10-CM

## 2021-10-12 DIAGNOSIS — M8589 Other specified disorders of bone density and structure, multiple sites: Secondary | ICD-10-CM | POA: Diagnosis not present

## 2021-10-12 DIAGNOSIS — Z780 Asymptomatic menopausal state: Secondary | ICD-10-CM | POA: Diagnosis not present

## 2021-11-30 ENCOUNTER — Ambulatory Visit (INDEPENDENT_AMBULATORY_CARE_PROVIDER_SITE_OTHER): Payer: PPO | Admitting: Sports Medicine

## 2021-11-30 ENCOUNTER — Ambulatory Visit (INDEPENDENT_AMBULATORY_CARE_PROVIDER_SITE_OTHER): Payer: PPO

## 2021-11-30 VITALS — BP 120/80 | HR 64 | Ht 64.0 in | Wt 158.0 lb

## 2021-11-30 DIAGNOSIS — M79672 Pain in left foot: Secondary | ICD-10-CM

## 2021-11-30 DIAGNOSIS — M7662 Achilles tendinitis, left leg: Secondary | ICD-10-CM | POA: Diagnosis not present

## 2021-11-30 MED ORDER — MELOXICAM 15 MG PO TABS: 15.0000 mg | ORAL_TABLET | Freq: Every day | ORAL | 0 refills | Status: DC

## 2021-11-30 NOTE — Progress Notes (Signed)
? ? Benito Mccreedy D.Merril Abbe ?[facility] ?[place] ?Phone: [phone] ?  ?Assessment and Plan:   ?  ?1. Left foot pain ?2. Achilles tendinitis of left lower extremity ?-Acute, uncomplicated, initial sports medicine visit ?- Likely Achilles tendinitis with irritation of Achilles tendon enthesophytes at posterior calcaneus based on HPI, physical exam, x-ray ?- Start meloxicam 15 mg daily x2 weeks.  If still having pain after 2 weeks, complete 3rd-week of meloxicam. May use remaining meloxicam as needed once daily for pain control.  Do not to use additional NSAIDs while taking meloxicam.  May use Tylenol [phone] mg 2 to 3 times a day for breakthrough pain. ?- Start HEP for Achilles tendon and gastroc ?- X-ray obtained in clinic.  My interpretation: No acute fracture or dislocation.  Small Achilles tendon enthesophytes at posterior calcaneus ?  ?Pertinent previous records reviewed include none ?  ?Follow Up: As needed if no improvement or worsening of symptoms.  Could consider ultrasound and discussion of foot wear if needed ?  ?Subjective:   ?I, Pincus Badder, am serving as a Education administrator for [employer] ? ?Chief Complaint: left heel pain  ? ?HPI:  ? ?11/30/21 ?Patient is a 66 year old female complaining of left heel pain. Patient states that she was on her feet a lot Thursday Friday and Saturday helping her son move , Sunday morning she had pain got better through day and then the night it was painful again , Monday she walked a lot so now in pain again, yesterday woke up with pain again but as the day went on she states the pain decreased , today she has little pain but it has gotten better, does have a hx of this in her big toes in the past ,  thinks its just from being on her feet a lot  ? ?Relevant Historical Information: Hypertension ? ?Additional pertinent review of systems negative. ? ? ?Current Outpatient Medications:  ?  aspirin 81 MG tablet,  Take 1 tablet by mouth daily., Disp: , Rfl:  ?  calcium carbonate (OSCAL) 1500 (600 Ca) MG TABS tablet, Take 1 tablet by mouth daily., Disp: , Rfl:  ?  clobetasol cream (TEMOVATE) 0.05 %, Apply twice daily up to 2 weeks to affected area on left thigh and left ankle. Avoid applying to face, groin, and axilla., Disp: 30 g, Rfl: 1 ?  meloxicam (MOBIC) 15 MG tablet, Take 1 tablet (15 mg total) by mouth daily., Disp: 30 tablet, Rfl: 0 ?  omeprazole (PRILOSEC) 20 MG capsule, Take 1 capsule by mouth daily., Disp: , Rfl:  ?  OVER THE COUNTER MEDICATION, Take 1 tablet by mouth daily. For eyes, Disp: , Rfl:  ?  Probiotic Product (PROBIOTIC DAILY PO), Take 1 capsule by mouth daily., Disp: , Rfl:  ?  triamcinolone ointment (KENALOG) 0.1 %, "Cool down ointment" - If needed after stopping treatment with fluorouracil, apply twice a day for 3 days for face and up to 7 days for body areas to calm down inflammation, Disp: 80 g, Rfl: 0 ?  triamterene-hydrochlorothiazide (MAXZIDE) 75-50 MG tablet, Take 1 tablet by mouth daily. , Disp: , Rfl:   ? ?Objective:   ?  ?Vitals:  ? 11/30/21 1100  ?BP: 120/80  ?Pulse: 64  ?SpO2: 98%  ?Weight: 158 lb (71.7 kg)  ?Height: '5\' 4"'$  (1.626 m)  ?  ?  ?Body mass index is 27.12 kg/m?.  ?  ?Physical Exam:   ? ?  Gen: Appears well, nad, nontoxic and pleasant ?Psych: Alert and oriented, appropriate mood and affect ?Neuro: sensation intact, strength is 5/5 with df/pf/inv/ev, muscle tone wnl ?Skin: no susupicious lesions or rashes ? ?Left ankle: no deformity, no swelling or effusion ?TTP posterior calcaneus coinciding with area of Achilles tendon calcifications seen on x-ray ?NTTP over fibular head, lat mal, medial mal, achilles, navicular, base of 5th, ATFL, CFL, deltoid,   or midfoot ?ROM DF 30, PF 45, inv/ev intact ?Negative ant drawer, talar tilt, rotation test, squeeze test. ?Neg thompson ?No pain with resisted inversion or eversion  ?Increased soreness and tightness with left gastroc/Achilles stretch  compared to right ? ?Electronically signed by:  ?Benito Mccreedy D.Merril Abbe ?[facility] ?11:21 AM 11/30/21 ?

## 2021-11-30 NOTE — Patient Instructions (Addendum)
Good to see you  ?- Start meloxicam 15 mg daily x2 weeks.  If still having pain after 2 weeks, complete 3rd-week of meloxicam. May use remaining meloxicam as needed once daily for pain control.  Do not to use additional NSAIDs while taking meloxicam.  May use Tylenol [phone] mg 2 to 3 times a day for breakthrough pain. ?Achilles and calf HEP  ?As needed follow up  ?

## 2021-12-28 DIAGNOSIS — G4733 Obstructive sleep apnea (adult) (pediatric): Secondary | ICD-10-CM | POA: Diagnosis not present

## 2022-01-24 DIAGNOSIS — K648 Other hemorrhoids: Secondary | ICD-10-CM | POA: Diagnosis not present

## 2022-01-24 DIAGNOSIS — K52832 Lymphocytic colitis: Secondary | ICD-10-CM | POA: Diagnosis not present

## 2022-01-24 DIAGNOSIS — Z980 Intestinal bypass and anastomosis status: Secondary | ICD-10-CM | POA: Diagnosis not present

## 2022-01-24 DIAGNOSIS — K6289 Other specified diseases of anus and rectum: Secondary | ICD-10-CM | POA: Diagnosis not present

## 2022-01-24 DIAGNOSIS — K625 Hemorrhage of anus and rectum: Secondary | ICD-10-CM | POA: Diagnosis not present

## 2022-01-24 DIAGNOSIS — R197 Diarrhea, unspecified: Secondary | ICD-10-CM | POA: Diagnosis not present

## 2022-01-24 DIAGNOSIS — K5730 Diverticulosis of large intestine without perforation or abscess without bleeding: Secondary | ICD-10-CM | POA: Diagnosis not present

## 2022-01-26 DIAGNOSIS — K52832 Lymphocytic colitis: Secondary | ICD-10-CM | POA: Diagnosis not present

## 2022-02-09 DIAGNOSIS — G4733 Obstructive sleep apnea (adult) (pediatric): Secondary | ICD-10-CM | POA: Diagnosis not present

## 2022-02-27 ENCOUNTER — Other Ambulatory Visit: Payer: Self-pay | Admitting: Dermatology

## 2022-03-01 ENCOUNTER — Other Ambulatory Visit: Payer: Self-pay | Admitting: Dermatology

## 2022-03-06 ENCOUNTER — Other Ambulatory Visit: Payer: Self-pay

## 2022-03-06 MED ORDER — TRIAMCINOLONE ACETONIDE 0.1 % EX OINT: TOPICAL_OINTMENT | CUTANEOUS | 0 refills | Status: DC

## 2022-03-06 NOTE — Progress Notes (Signed)
Refill request faxed over from [facility]. Escripted

## 2022-03-28 ENCOUNTER — Other Ambulatory Visit: Payer: Self-pay | Admitting: Sports Medicine

## 2022-04-26 ENCOUNTER — Ambulatory Visit (INDEPENDENT_AMBULATORY_CARE_PROVIDER_SITE_OTHER): Payer: PPO | Admitting: Dermatology

## 2022-04-26 DIAGNOSIS — L814 Other melanin hyperpigmentation: Secondary | ICD-10-CM

## 2022-04-26 DIAGNOSIS — D1801 Hemangioma of skin and subcutaneous tissue: Secondary | ICD-10-CM | POA: Diagnosis not present

## 2022-04-26 DIAGNOSIS — Z1283 Encounter for screening for malignant neoplasm of skin: Secondary | ICD-10-CM

## 2022-04-26 DIAGNOSIS — L570 Actinic keratosis: Secondary | ICD-10-CM | POA: Diagnosis not present

## 2022-04-26 DIAGNOSIS — L821 Other seborrheic keratosis: Secondary | ICD-10-CM

## 2022-04-26 DIAGNOSIS — D045 Carcinoma in situ of skin of trunk: Secondary | ICD-10-CM

## 2022-04-26 DIAGNOSIS — L578 Other skin changes due to chronic exposure to nonionizing radiation: Secondary | ICD-10-CM

## 2022-04-26 DIAGNOSIS — D492 Neoplasm of unspecified behavior of bone, soft tissue, and skin: Secondary | ICD-10-CM

## 2022-04-26 DIAGNOSIS — Z85828 Personal history of other malignant neoplasm of skin: Secondary | ICD-10-CM | POA: Diagnosis not present

## 2022-04-26 DIAGNOSIS — Z86018 Personal history of other benign neoplasm: Secondary | ICD-10-CM

## 2022-04-26 DIAGNOSIS — D099 Carcinoma in situ, unspecified: Secondary | ICD-10-CM

## 2022-04-26 HISTORY — DX: Carcinoma in situ, unspecified: D09.9

## 2022-04-26 NOTE — Patient Instructions (Addendum)
Triamcinolone ointment -  " Cool Down Ointment" If needed after stopping treatment with Fluorouracil, apply twice a day for 3 days for face and up to 7 days for body areas to calm down inflammation  - Start 5-fluorouracil/calcipotriene cream twice a day for 7 days to affected areas including arms, hands, chest. Patient provided with handout reviewing treatment course and side effects and advised to call or message [place] on MyChart with any concerns.  Reviewed course of treatment and expected reaction.  Patient advised to expect inflammation and crusting and advised that erosions are possible.  Patient advised to be diligent with sun protection during and after treatment. Counseled to keep medication out of reach of children and pets.   Wound Care Instructions  Cleanse wound gently with soap and water once a day then pat dry with clean gauze. Apply a thin coat of Petrolatum (petroleum jelly, "Vaseline") over the wound (unless you have an allergy to this). We recommend that you use a new, sterile tube of Vaseline. Do not pick or remove scabs. Do not remove the yellow or white "healing tissue" from the base of the wound.  Cover the wound with fresh, clean, nonstick gauze and secure with paper tape. You may use Band-Aids in place of gauze and tape if the wound is small enough, but would recommend trimming much of the tape off as there is often too much. Sometimes Band-Aids can irritate the skin.  You should call the office for your biopsy report after 1 week if you have not already been contacted.  If you experience any problems, such as abnormal amounts of bleeding, swelling, significant bruising, significant pain, or evidence of infection, please call the office immediately.  FOR ADULT SURGERY PATIENTS: If you need something for pain relief you may take 1 extra strength Tylenol (acetaminophen) AND 2 Ibuprofen ('200mg'$  each) together every 4 hours as needed for pain. (do not take these if you are allergic  to them or if you have a reason you should not take them.) Typically, you may only need pain medication for 1 to 3 days.   Cryotherapy Aftercare  Wash gently with soap and water everyday.   Apply Vaseline and Band-Aid daily until healed.    Recommend taking Heliocare sun protection supplement daily in sunny weather for additional sun protection. For maximum protection on the sunniest days, you can take up to 2 capsules of regular Heliocare OR take 1 capsule of Heliocare Ultra. For prolonged exposure (such as a full day in the sun), you can repeat your dose of the supplement 4 hours after your first dose. Heliocare can be purchased at [employer], at some [facility] or at [email or web address].    Melanoma ABCDEs  Melanoma is the most dangerous type of skin cancer, and is the leading cause of death from skin disease.  You are more likely to develop melanoma if you: Have light-colored skin, light-colored eyes, or red or blond hair Spend a lot of time in the sun Tan regularly, either outdoors or in a tanning bed Have had blistering sunburns, especially during childhood Have a close family member who has had a melanoma Have atypical moles or large birthmarks  Early detection of melanoma is key since treatment is typically straightforward and cure rates are extremely high if we catch it early.   The first sign of melanoma is often a change in a mole or a new dark spot.  The ABCDE system is a way of remembering the signs of melanoma.  A for asymmetry:  The two halves do not match. B for border:  The edges of the growth are irregular. C for color:  A mixture of colors are present instead of an even brown color. D for diameter:  Melanomas are usually (but not always) greater than 25m - the size of a pencil eraser. E for evolution:  The spot keeps changing in size, shape, and color.  Please check your skin once per month between visits. You can use a small mirror in front and a large mirror  behind you to keep an eye on the back side or your body.   If you see any new or changing lesions before your next follow-up, please call to schedule a visit.  Please continue daily skin protection including broad spectrum sunscreen SPF 30+ to sun-exposed areas, reapplying every 2 hours as needed when you're outdoors.    Due to recent changes in healthcare laws, you may see results of your pathology and/or laboratory studies on MyChart before the doctors have had a chance to review them. We understand that in some cases there may be results that are confusing or concerning to you. Please understand that not all results are received at the same time and often the doctors may need to interpret multiple results in order to provide you with the best plan of care or course of treatment. Therefore, we ask that you please give u[place]2 business days to thoroughly review all your results before contacting the office for clarification. Should we see a critical lab result, you will be contacted sooner.   If You Need Anything After Your Visit  If you have any questions or concerns for your doctor, please call our main line at [phone] press option 4 to reach your doctor's medical assistant. If no one answers, please leave a voicemail as directed and we will return your call as soon as possible. Messages left after 4 pm will be answered the following business day.   You may also send [place] message via [facility] We typically respond to MyChart messages within 1-2 business days.  For prescription refills, please ask your pharmacy to contact our office. Our fax number is [phone]  If you have an urgent issue when the clinic is closed that cannot wait until the next business day, you can page your doctor at the number below.    Please note that while we do our best to be available for urgent issues outside of office hours, we are not available 24/7.   If you have an urgent issue and are unable to reach  u[place] you may choose to seek medical care at your doctor's office, retail clinic, urgent care center, or emergency room.  If you have a medical emergency, please immediately call 911 or go to the emergency department.  Pager Numbers  - Dr. KNehemiah Massed [phone] - Dr. MLaurence Ferrari [phone] - Dr. SNicole Kindred [phone] In the event of inclement weather, please call our main line at [phone] an update on the status of any delays or closures.  Dermatology Medication Tips: Please keep the boxes that topical medications come in in order to help keep track of the instructions about where and how to use these. Pharmacies typically print the medication instructions only on the boxes and not directly on the medication tubes.   If your medication is too expensive, please contact our office at [phone] 4 or send [place] message through [facility]   We are  unable to tell what your co-pay for medications will be in advance as this is different depending on your insurance coverage. However, we may be able to find a substitute medication at lower cost or fill out paperwork to get insurance to cover a needed medication.   If a prior authorization is required to get your medication covered by your insurance company, please allow [place] 1-2 business days to complete this process.  Drug prices often vary depending on where the prescription is filled and some pharmacies may offer cheaper prices.  The website www.goodrx.com contains coupons for medications through different pharmacies. The prices here do not account for what the cost may be with help from insurance (it may be cheaper with your insurance), but the website can give you the price if you did not use any insurance.  - You can print the associated coupon and take it with your prescription to the pharmacy.  - You may also stop by our office during regular business hours and pick up a GoodRx coupon card.  - If you need your prescription sent  electronically to a different pharmacy, notify our office through [facility] or by phone at [phone] option 4.     Si Usted Necesita Algo Despus de Su Visita  Tambin puede enviarnos un mensaje a travs de Pharmacist, community. Por lo general respondemos a los mensajes de MyChart en el transcurso de 1 a 2 das hbiles.  Para renovar recetas, por favor pida a su farmacia que se ponga en contacto con nuestra oficina. Harland Dingwall de fax es [place] [phone].  Si tiene un asunto urgente cuando la clnica est cerrada y que no puede esperar hasta el siguiente da hbil, puede llamar/localizar a su doctor(a) al nmero que aparece a continuacin.   Por favor, tenga en cuenta que aunque hacemos todo lo posible para estar disponibles para asuntos urgentes fuera del horario de [place], no estamos disponibles las 24 horas del da, los 7 das de la [place].   Si tiene un problema urgente y no puede comunicarse con nosotros, puede optar por buscar atencin mdica  en el consultorio de su doctor(a), en una clnica privada, en un centro de atencin urgente o en una sala de emergencias.  Si tiene Engineering geologist, por favor llame inmediatamente al 911 o vaya a la sala de emergencias.  Nmeros de bper  - Dr. Nehemiah Massed: [phone]  - Dra. Moye: [phone]  - Dra. Nicole Kindred: [phone]  En caso de inclemencias del [place], por favor llame a Johnsie Kindred principal al [phone] para una actualizacin sobre el [place] de cualquier retraso o cierre.  Consejos para la medicacin en dermatologa: Por favor, guarde las cajas en las que vienen los medicamentos de uso tpico para ayudarle a seguir las instrucciones sobre dnde y cmo usarlos. Las farmacias generalmente imprimen las instrucciones del medicamento slo en las cajas y no directamente en los tubos del [place].   Si su medicamento es muy caro, por favor, pngase en contacto con Zigmund Daniel llamando al [phone] y presione la  opcin 4 o envenos un mensaje a travs de Pharmacist, community.   No podemos decirle cul ser su copago por los medicamentos por adelantado ya que esto es diferente dependiendo de la cobertura de su seguro. Sin embargo, es posible que podamos encontrar un medicamento sustituto a Electrical engineer un formulario para que el seguro cubra el medicamento que se considera necesario.   Si se requiere una autorizacin previa para que su compaa de  seguros [place] su medicamento, por favor permtanos de 1 a 2 das hbiles para completar este proceso.  Los precios de los medicamentos varan con frecuencia dependiendo del Environmental consultant de dnde se surte la receta y alguna farmacias pueden ofrecer precios ms baratos.  El sitio web www.goodrx.com tiene cupones para medicamentos de Airline pilot. Los precios aqu no tienen en cuenta lo que podra costar con la ayuda del seguro (puede ser ms barato con su seguro), pero el sitio web puede darle el precio si no utiliz Research scientist (physical sciences).  - Puede imprimir el cupn correspondiente y llevarlo con su receta a la farmacia.  - Tambin puede pasar por nuestra oficina durante el horario de atencin regular y Charity fundraiser una tarjeta de cupones de GoodRx.  - Si necesita que su receta se enve electrnicamente a una farmacia diferente, informe a nuestra oficina a travs de MyChart de [place] o por telfono llamando al [phone] y presione la opcin 4.

## 2022-04-26 NOTE — Progress Notes (Signed)
Follow-Up Visit   Subjective  [name] is a 66 y.o. female who presents for the following: Annual Exam (The patient presents for Total-Body Skin Exam (TBSE) for skin cancer screening and mole check.  The patient has spots, moles and lesions to be evaluated, some may be new or changing and the patient has concerns that these could be cancer. Patient with hx of SCC, BCC, dysplastic nevi and AK's. ).  Patient has used 5FU/calcipotriene at hands, arms, shoulders, lower legs and most recently at posterior neck and lip with good results.   The following portions of the chart were reviewed this encounter and updated as appropriate:   Tobacco  Allergies  Meds  Problems  Med Hx  Surg Hx  Fam Hx      Review of Systems:  No other skin or systemic complaints except as noted in HPI or Assessment and Plan.  Objective  Well appearing patient in no apparent distress; mood and affect are within normal limits.  A full examination was performed including scalp, head, eyes, ears, nose, lips, neck, chest, axillae, abdomen, back, buttocks, bilateral upper extremities, bilateral lower extremities, hands, feet, fingers, toes, fingernails, and toenails. All findings within normal limits unless otherwise noted below.  Left Abdomen 0.7 cm scaly pink papule R/o SCC  left lateral thigh x 1, left pretibia x 2, left medial calf x 1, right ant thigh x 1, right thumb x 1, right heel x 1, right medial calf x 2 (9) Erythematous thin papules/macules with gritty scale.     Assessment & Plan  Neoplasm of skin Left Abdomen  Skin / nail biopsy Type of biopsy: tangential   Informed consent: discussed and consent obtained   Timeout: patient name, date of birth, surgical site, and procedure verified   Procedure prep:  Patient was prepped and draped in usual sterile fashion Prep type:  Isopropyl alcohol Anesthesia: the lesion was anesthetized in a standard fashion   Anesthetic:  1% lidocaine w/  epinephrine 1-100,000 buffered w/ 8.4% NaHCO3 Instrument used: flexible razor blade   Hemostasis achieved with: aluminum chloride   Outcome: patient tolerated procedure well   Post-procedure details: wound care instructions given   Additional details:  Petrolatum and a pressure bandage applied  Specimen 1 - Surgical pathology Differential Diagnosis: R/o SCC  Check Margins: No 0.7 cm scaly pink papule   AK (actinic keratosis) (9) left lateral thigh x 1, left pretibia x 2, left medial calf x 1, right ant thigh x 1, right thumb x 1, right heel x 1, right medial calf x 2  Actinic keratoses are precancerous spots that appear secondary to cumulative UV radiation exposure/sun exposure over time. They are chronic with expected duration over 1 year. A portion of actinic keratoses will progress to squamous cell carcinoma of the skin. It is not possible to reliably predict which spots will progress to skin cancer and so treatment is recommended to prevent development of skin cancer.  Recommend daily broad spectrum sunscreen SPF 30+ to sun-exposed areas, reapply every 2 hours as needed.  Recommend staying in the shade or wearing long sleeves, sun glasses (UVA+UVB protection) and wide brim hats (4-inch brim around the entire circumference of the hat). Call for new or changing lesions.  Prior to procedure, discussed risks of blister formation, small wound, skin dyspigmentation, or rare scar following cryotherapy. Recommend Vaseline ointment to treated areas while healing.   Destruction of lesion - left lateral thigh x 1, left pretibia x 2, left  medial calf x 1, right ant thigh x 1, right thumb x 1, right heel x 1, right medial calf x 2  Destruction method: cryotherapy   Informed consent: discussed and consent obtained   Lesion destroyed using liquid nitrogen: Yes   Cryotherapy cycles:  2 Outcome: patient tolerated procedure well with no complications   Post-procedure details: wound care  instructions given     Lentigines - Scattered tan macules - Due to sun exposure - Benign-appearing, observe - Recommend daily broad spectrum sunscreen SPF 30+ to sun-exposed areas, reapply every 2 hours as needed. - Call for any changes  Seborrheic Keratoses - Stuck-on, waxy, tan-brown papules and/or plaques  - Benign-appearing - Discussed benign etiology and prognosis. - Observe - Call for any changes  Melanocytic Nevi - Tan-brown and/or pink-flesh-colored symmetric macules and papules - Benign appearing on exam today - Observation - Call clinic for new or changing moles - Recommend daily use of broad spectrum spf 30+ sunscreen to sun-exposed areas.   Hemangiomas - Red papules - Discussed benign nature - Observe - Call for any changes  Skin cancer screening performed today.  History of Basal Cell Carcinoma of the Skin - No evidence of recurrence today - Recommend regular full body skin exams - Recommend daily broad spectrum sunscreen SPF 30+ to sun-exposed areas, reapply every 2 hours as needed.  - Call if any new or changing lesions are noted between office visits  History of Dysplastic Nevi - No evidence of recurrence today - Recommend regular full body skin exams - Recommend daily broad spectrum sunscreen SPF 30+ to sun-exposed areas, reapply every 2 hours as needed.  - Call if any new or changing lesions are noted between office visits  History of Squamous Cell Carcinoma of the Skin - No evidence of recurrence today - No lymphadenopathy - Recommend regular full body skin exams - Recommend daily broad spectrum sunscreen SPF 30+ to sun-exposed areas, reapply every 2 hours as needed.  - Call if any new or changing lesions are noted between office visits  Actinic Damage - Severe, confluent actinic changes with pre-cancerous actinic keratoses  - Severe, chronic, not at goal, secondary to cumulative UV radiation exposure over time - diffuse scaly erythematous  macules and papules with underlying dyspigmentation - Discussed Prescription "Field Treatment" for Severe, Chronic Confluent Actinic Changes with Pre-Cancerous Actinic Keratoses Field treatment involves treatment of an entire area of skin that has confluent Actinic Changes (Sun/ Ultraviolet light damage) and PreCancerous Actinic Keratoses by method of PhotoDynamic Therapy (PDT) and/or prescription Topical Chemotherapy agents such as 5-fluorouracil, 5-fluorouracil/calcipotriene, and/or imiquimod.  The purpose is to decrease the number of clinically evident and subclinical PreCancerous lesions to prevent progression to development of skin cancer by chemically destroying early precancer changes that may or may not be visible.  It has been shown to reduce the risk of developing skin cancer in the treated area. As a result of treatment, redness, scaling, crusting, and open sores may occur during treatment course. One or more than one of these methods may be used and may have to be used several times to control, suppress and eliminate the PreCancerous changes. Discussed treatment course, expected reaction, and possible side effects. - Recommend daily broad spectrum sunscreen SPF 30+ to sun-exposed areas, reapply every 2 hours as needed.  - Staying in the shade or wearing long sleeves, sun glasses (UVA+UVB protection) and wide brim hats (4-inch brim around the entire circumference of the hat) are also recommended. - Call for new  or changing lesions. - PDT to top of feet and legs - PDT or 5FU/calcipotriene to arms, hands, chest - Patient will plan PDT to arms and hands, 5FU/cal to chest at home   Return for PDT to top/sides of feet and legs, arms and hands.  Graciella Belton, RMA, am acting as scribe for Forest Gleason, MD .  Documentation: I have reviewed the above documentation for accuracy and completeness, and I agree with the above.  Forest Gleason, MD

## 2022-04-28 ENCOUNTER — Encounter: Payer: Self-pay | Admitting: Dermatology

## 2022-04-28 DIAGNOSIS — R140 Abdominal distension (gaseous): Secondary | ICD-10-CM | POA: Diagnosis not present

## 2022-04-28 DIAGNOSIS — R143 Flatulence: Secondary | ICD-10-CM | POA: Diagnosis not present

## 2022-04-28 DIAGNOSIS — K52832 Lymphocytic colitis: Secondary | ICD-10-CM | POA: Diagnosis not present

## 2022-05-02 ENCOUNTER — Telehealth: Payer: Self-pay

## 2022-05-02 NOTE — Telephone Encounter (Addendum)
  Called and discussed bx results and scheduled patient for ED&C. Patient denied questions at this time and verbalized understanding.    ----- Message from Alfonso Patten, MD sent at 05/02/2022 11:27 AM EDT ----- Skin , left abdomen SQUAMOUS CELL CARCINOMA IN SITU --> ED&C  MAs please call with results and schedule. Please let me know if they have any questions. Thank you!

## 2022-05-23 ENCOUNTER — Ambulatory Visit: Payer: PPO | Admitting: Dermatology

## 2022-06-15 ENCOUNTER — Ambulatory Visit: Payer: PPO | Admitting: Dermatology

## 2022-06-16 DIAGNOSIS — R0781 Pleurodynia: Secondary | ICD-10-CM | POA: Diagnosis not present

## 2022-06-16 DIAGNOSIS — Z23 Encounter for immunization: Secondary | ICD-10-CM | POA: Diagnosis not present

## 2022-06-16 DIAGNOSIS — E7800 Pure hypercholesterolemia, unspecified: Secondary | ICD-10-CM | POA: Diagnosis not present

## 2022-06-16 DIAGNOSIS — K52832 Lymphocytic colitis: Secondary | ICD-10-CM | POA: Diagnosis not present

## 2022-06-16 DIAGNOSIS — M8588 Other specified disorders of bone density and structure, other site: Secondary | ICD-10-CM | POA: Diagnosis not present

## 2022-06-16 DIAGNOSIS — Z1211 Encounter for screening for malignant neoplasm of colon: Secondary | ICD-10-CM | POA: Diagnosis not present

## 2022-06-16 DIAGNOSIS — Z1331 Encounter for screening for depression: Secondary | ICD-10-CM | POA: Diagnosis not present

## 2022-06-16 DIAGNOSIS — K219 Gastro-esophageal reflux disease without esophagitis: Secondary | ICD-10-CM | POA: Diagnosis not present

## 2022-06-16 DIAGNOSIS — Z0000 Encounter for general adult medical examination without abnormal findings: Secondary | ICD-10-CM | POA: Diagnosis not present

## 2022-06-16 DIAGNOSIS — G4733 Obstructive sleep apnea (adult) (pediatric): Secondary | ICD-10-CM | POA: Diagnosis not present

## 2022-06-16 DIAGNOSIS — I10 Essential (primary) hypertension: Secondary | ICD-10-CM | POA: Diagnosis not present

## 2022-06-16 DIAGNOSIS — K580 Irritable bowel syndrome with diarrhea: Secondary | ICD-10-CM | POA: Diagnosis not present

## 2022-06-19 ENCOUNTER — Ambulatory Visit (INDEPENDENT_AMBULATORY_CARE_PROVIDER_SITE_OTHER): Payer: PPO

## 2022-06-19 DIAGNOSIS — L570 Actinic keratosis: Secondary | ICD-10-CM | POA: Diagnosis not present

## 2022-06-19 MED ORDER — AMINOLEVULINIC ACID HCL 20 % EX SOLR
2.0000 | Freq: Once | CUTANEOUS | Status: AC
  Administered 2022-06-19: 708 mg via TOPICAL

## 2022-06-19 NOTE — Patient Instructions (Signed)

## 2022-06-19 NOTE — Progress Notes (Signed)
Patient completed PDT therapy today.  1. AK (actinic keratosis) (4) Left Foot - Anterior; Right Foot - Anterior; Left Lower Leg - Anterior; Right Lower Leg - Anterior  Photodynamic therapy - Left Foot - Anterior, Left Lower Leg - Anterior, Right Foot - Anterior, Right Lower Leg - Anterior Procedure discussed: discussed risks, benefits, side effects. and alternatives   Prep: site scrubbed/prepped with acetone   Location:  Legs and top of feet Number of lesions:  Multiple Type of treatment:  Blue light Aminolevulinic Acid (see MAR for details): Levulan Number of Levulan sticks used:  2 Incubation time (minutes):  120 Number of minutes under lamp:  16 Number of seconds under lamp:  40 Cooling:  Floor fan Outcome: patient tolerated procedure well with no complications   Post-procedure details: sunscreen applied    Aminolevulinic Acid HCl 20 % SOLR 708 mg - Left Foot - Anterior, Left Lower Leg - Anterior, Right Foot - Anterior, Right Lower Leg - Anterior   Johnsie Kindred, RMA

## 2022-06-23 DIAGNOSIS — R140 Abdominal distension (gaseous): Secondary | ICD-10-CM | POA: Diagnosis not present

## 2022-06-23 DIAGNOSIS — K6389 Other specified diseases of intestine: Secondary | ICD-10-CM | POA: Diagnosis not present

## 2022-06-29 ENCOUNTER — Ambulatory Visit: Payer: PPO | Admitting: Dermatology

## 2022-07-03 ENCOUNTER — Ambulatory Visit (INDEPENDENT_AMBULATORY_CARE_PROVIDER_SITE_OTHER): Payer: PPO | Admitting: Dermatology

## 2022-07-03 ENCOUNTER — Encounter: Payer: Self-pay | Admitting: Dermatology

## 2022-07-03 DIAGNOSIS — L570 Actinic keratosis: Secondary | ICD-10-CM

## 2022-07-03 DIAGNOSIS — C44729 Squamous cell carcinoma of skin of left lower limb, including hip: Secondary | ICD-10-CM

## 2022-07-03 DIAGNOSIS — L821 Other seborrheic keratosis: Secondary | ICD-10-CM

## 2022-07-03 DIAGNOSIS — L578 Other skin changes due to chronic exposure to nonionizing radiation: Secondary | ICD-10-CM | POA: Diagnosis not present

## 2022-07-03 DIAGNOSIS — D492 Neoplasm of unspecified behavior of bone, soft tissue, and skin: Secondary | ICD-10-CM

## 2022-07-03 DIAGNOSIS — D099 Carcinoma in situ, unspecified: Secondary | ICD-10-CM

## 2022-07-03 DIAGNOSIS — D045 Carcinoma in situ of skin of trunk: Secondary | ICD-10-CM | POA: Diagnosis not present

## 2022-07-03 MED ORDER — MUPIROCIN 2 % EX OINT: 1.0000 | TOPICAL_OINTMENT | Freq: Every day | CUTANEOUS | 0 refills | Status: DC

## 2022-07-03 NOTE — Patient Instructions (Addendum)
- Start 5-fluorouracil/calcipotriene cream twice a day for 7 days to affected areas, once area at a time, including right leg and foot, right arm and hand, left arm and and hand, left leg and foot, chest. Prescription sent to [facility]. Patient advised they will receive an email to purchase the medication online and have it sent to their home. Patient provided with handout reviewing treatment course and side effects and advised to call or message [place] on MyChart with any concerns.  Wound Care Instructions  Cleanse wound gently with soap and water once a day then pat dry with clean gauze. Apply a thin coat of Petrolatum (petroleum jelly, "Vaseline") over the wound (unless you have an allergy to this). We recommend that you use a new, sterile tube of Vaseline. Do not pick or remove scabs. Do not remove the yellow or white "healing tissue" from the base of the wound.  Cover the wound with fresh, clean, nonstick gauze and secure with paper tape. You may use Band-Aids in place of gauze and tape if the wound is small enough, but would recommend trimming much of the tape off as there is often too much. Sometimes Band-Aids can irritate the skin.  You should call the office for your biopsy report after 1 week if you have not already been contacted.  If you experience any problems, such as abnormal amounts of bleeding, swelling, significant bruising, significant pain, or evidence of infection, please call the office immediately.  FOR ADULT SURGERY PATIENTS: If you need something for pain relief you may take 1 extra strength Tylenol (acetaminophen) AND 2 Ibuprofen ('200mg'$  each) together every 4 hours as needed for pain. (do not take these if you are allergic to them or if you have a reason you should not take them.) Typically, you may only need pain medication for 1 to 3 days.     Due to recent changes in healthcare laws, you may see results of your pathology and/or laboratory studies on  MyChart before the doctors have had a chance to review them. We understand that in some cases there may be results that are confusing or concerning to you. Please understand that not all results are received at the same time and often the doctors may need to interpret multiple results in order to provide you with the best plan of care or course of treatment. Therefore, we ask that you please give [place] 2 business days to thoroughly review all your results before contacting the office for clarification. Should we see a critical lab result, you will be contacted sooner.   If You Need Anything After Your Visit  If you have any questions or concerns for your doctor, please call our main line at [phone] and press option 4 to reach your doctor's medical assistant. If no one answers, please leave a voicemail as directed and we will return your call as soon as possible. Messages left after 4 pm will be answered the following business day.   You may also send [place] a message via [place]. We typically respond to MyChart messages within 1-2 business days.  For prescription refills, please ask your pharmacy to contact our office. Our fax number is [phone].  If you have an urgent issue when the clinic is closed that cannot wait until the next business day, you can page your doctor at the number below.    Please note that while we do our best to be available for urgent issues outside of office hours,  we are not available 24/7.   If you have an urgent issue and are unable to reach [place], you may choose to seek medical care at your doctor's office, retail clinic, urgent care center, or emergency room.  If you have a medical emergency, please immediately call 911 or go to the emergency department.  Pager Numbers  - Dr. Nehemiah Massed: [phone]  - Dr. Laurence Ferrari: [phone]  - Dr. Nicole Kindred: [phone]  In the event of inclement weather, please call our main line at [phone] for an update on the status of  any delays or closures.  Dermatology Medication Tips: Please keep the boxes that topical medications come in in order to help keep track of the instructions about where and how to use these. Pharmacies typically print the medication instructions only on the boxes and not directly on the medication tubes.   If your medication is too expensive, please contact our office at [phone] option 4 or send [place] a message through [place].   We are unable to tell what your co-pay for medications will be in advance as this is different depending on your insurance coverage. However, we may be able to find a substitute medication at lower cost or fill out paperwork to get insurance to cover a needed medication.   If a prior authorization is required to get your medication covered by your insurance company, please allow [place] 1-2 business days to complete this process.  Drug prices often vary depending on where the prescription is filled and some pharmacies may offer cheaper prices.  The website www.goodrx.com contains coupons for medications through different pharmacies. The prices here do not account for what the cost may be with help from insurance (it may be cheaper with your insurance), but the website can give you the price if you did not use any insurance.  - You can print the associated coupon and take it with your prescription to the pharmacy.  - You may also stop by our office during regular business hours and pick up a GoodRx coupon card.  - If you need your prescription sent electronically to a different pharmacy, notify our office through [facility] or by phone at [phone] option 4.     Si Usted Necesita Algo Despus de Su Visita  Tambin puede enviarnos un mensaje a travs de Pharmacist, community. Por lo general respondemos a los mensajes de MyChart en el transcurso de 1 a 2 das hbiles.  Para renovar recetas, por favor pida a su farmacia que se ponga en contacto con nuestra oficina. Harland Dingwall de fax es [place] [phone].  Si tiene un asunto urgente cuando la clnica est cerrada y que no puede esperar hasta el siguiente da hbil, puede llamar/localizar a su doctor(a) al nmero que aparece a continuacin.   Por favor, tenga en cuenta que aunque hacemos todo lo posible para estar disponibles para asuntos urgentes fuera del horario de [place], no estamos disponibles las 24 horas del da, los 7 das de la [place].   Si tiene un problema urgente y no puede comunicarse con nosotros, puede optar por buscar atencin mdica  en el consultorio de su doctor(a), en una clnica privada, en un centro de atencin urgente o en una sala de emergencias.  Si tiene Engineering geologist, por favor llame inmediatamente al 911 o vaya a la sala de emergencias.  Nmeros de bper  - Dr. Nehemiah Massed: [phone]  - Dra. Moye: [phone]  - Dra. Nicole Kindred: [phone]  En caso de inclemencias del [place],  por favor llame a nuestra lnea principal al [phone] para una actualizacin sobre el [place] de cualquier retraso o cierre.  Consejos para la medicacin en dermatologa: Por favor, guarde las cajas en las que vienen los medicamentos de uso tpico para ayudarle a seguir las instrucciones sobre dnde y cmo usarlos. Las farmacias generalmente imprimen las instrucciones del medicamento slo en las cajas y no directamente en los tubos del [place].   Si su medicamento es muy caro, por favor, pngase en contacto con Zigmund Daniel llamando al [phone] y presione la opcin 4 o envenos un mensaje a travs de Pharmacist, community.   No podemos decirle cul ser su copago por los medicamentos por adelantado ya que esto es diferente dependiendo de la cobertura de su seguro. Sin embargo, es posible que podamos encontrar un medicamento sustituto a Electrical engineer un formulario para que el seguro cubra el medicamento que se considera necesario.   Si se requiere una autorizacin previa para que su compaa de  seguros [place] su medicamento, por favor permtanos de 1 a 2 das hbiles para completar este proceso.  Los precios de los medicamentos varan con frecuencia dependiendo del Environmental consultant de dnde se surte la receta y alguna farmacias pueden ofrecer precios ms baratos.  El sitio web www.goodrx.com tiene cupones para medicamentos de Airline pilot. Los precios aqu no tienen en cuenta lo que podra costar con la ayuda del seguro (puede ser ms barato con su seguro), pero el sitio web puede darle el precio si no utiliz Research scientist (physical sciences).  - Puede imprimir el cupn correspondiente y llevarlo con su receta a la farmacia.  - Tambin puede pasar por nuestra oficina durante el horario de atencin regular y Charity fundraiser una tarjeta de cupones de GoodRx.  - Si necesita que su receta se enve electrnicamente a una farmacia diferente, informe a nuestra oficina a travs de MyChart de [place] o por telfono llamando al [phone] y presione la opcin 4.

## 2022-07-03 NOTE — Progress Notes (Signed)
Follow-Up Visit   Subjective  [name] is a 66 y.o. female who presents for the following: Procedure (Patient here today to treat bx proven SCCis at left abdomen. ).  Patient would also like you to look at her legs following LN2 and PDT.   The following portions of the chart were reviewed this encounter and updated as appropriate:   Tobacco  Allergies  Meds  Problems  Med Hx  Surg Hx  Fam Hx      Review of Systems:  No other skin or systemic complaints except as noted in HPI or Assessment and Plan.  Objective  Well appearing patient in no apparent distress; mood and affect are within normal limits.  A focused examination was performed including abdomen. Relevant physical exam findings are noted in the Assessment and Plan.  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  Destruction of lesion  Destruction method: electrodesiccation and curettage   Informed consent: discussed and consent obtained   Timeout:  patient name, date of birth, surgical site, and procedure verified Anesthesia: the lesion was anesthetized in a standard fashion   Anesthetic:  1% lidocaine w/ epinephrine 1-100,000 buffered w/ 8.4% NaHCO3 Curettage performed in three different directions: Yes   Electrodesiccation performed over the curetted area: Yes   Curettage cycles:  3 Final wound size (cm):  0.7 Hemostasis achieved with:  electrodesiccation Outcome: patient tolerated procedure well with no complications   Post-procedure details: sterile dressing applied and wound care instructions given   Dressing type: petrolatum    Neoplasm of skin left pretibia  Skin / nail biopsy Type of biopsy: tangential   Informed consent: discussed and consent obtained   Timeout: patient name, date of birth, surgical site, and procedure verified   Procedure prep:  Patient was prepped and draped in  usual sterile fashion Prep type:  Isopropyl alcohol Anesthesia: the lesion was anesthetized in a standard fashion   Anesthetic:  1% lidocaine w/ epinephrine 1-100,000 buffered w/ 8.4% NaHCO3 Instrument used: flexible razor blade   Hemostasis achieved with: aluminum chloride   Outcome: patient tolerated procedure well   Post-procedure details: wound care instructions given   Additional details:  Petrolatum and a pressure bandage applied  Destruction of lesion  Destruction method: electrodesiccation and curettage   Informed consent: discussed and consent obtained   Timeout:  patient name, date of birth, surgical site, and procedure verified Anesthesia: the lesion was anesthetized in a standard fashion   Anesthetic:  1% lidocaine w/ epinephrine 1-100,000 buffered w/ 8.4% NaHCO3 Curettage performed in three different directions: Yes   Electrodesiccation performed over the curetted area: Yes   Curettage cycles:  3 Final wound size (cm):  1.6 Hemostasis achieved with:  electrodesiccation Outcome: patient tolerated procedure well with no complications   Post-procedure details: sterile dressing applied and wound care instructions given   Dressing type: petrolatum    mupirocin ointment (BACTROBAN) 2 % Apply 1 Application topically daily.  Specimen 1 - Surgical pathology Differential Diagnosis: r/o SCC  Check Margins: No 1.2 cm firm scaly pink plaque Treated with EDC today    Actinic Damage with PreCancerous Actinic Keratoses Counseling for Topical Chemotherapy Management: Patient exhibits: - Severe, confluent actinic changes with pre-cancerous actinic keratoses that is secondary to cumulative UV radiation exposure over time - Condition that is severe; chronic, not at goal. - diffuse scaly  erythematous macules and papules with underlying dyspigmentation - Discussed Prescription "Field Treatment" topical Chemotherapy for Severe, Chronic Confluent Actinic Changes with Pre-Cancerous  Actinic Keratoses Field treatment involves treatment of an entire area of skin that has confluent Actinic Changes (Sun/ Ultraviolet light damage) and PreCancerous Actinic Keratoses by method of PhotoDynamic Therapy (PDT) and/or prescription Topical Chemotherapy agents such as 5-fluorouracil, 5-fluorouracil/calcipotriene, and/or imiquimod.  The purpose is to decrease the number of clinically evident and subclinical PreCancerous lesions to prevent progression to development of skin cancer by chemically destroying early precancer changes that may or may not be visible.  It has been shown to reduce the risk of developing skin cancer in the treated area. As a result of treatment, redness, scaling, crusting, and open sores may occur during treatment course. One or more than one of these methods may be used and may have to be used several times to control, suppress and eliminate the PreCancerous changes. Discussed treatment course, expected reaction, and possible side effects. - Recommend daily broad spectrum sunscreen SPF 30+ to sun-exposed areas, reapply every 2 hours as needed.  - Staying in the shade or wearing long sleeves, sun glasses (UVA+UVB protection) and wide brim hats (4-inch brim around the entire circumference of the hat) are also recommended. - Call for new or changing lesions.  - Start 5-fluorouracil/calcipotriene cream twice a day for 7 days to affected areas, once area at a time, including right leg and foot, right arm and hand, left arm and and hand, left leg and foot, chest. Prescription sent to [facility]. Patient advised they will receive an email to purchase the medication online and have it sent to their home. Patient provided with handout reviewing treatment course and side effects and advised to call or message [place] on MyChart with any concerns.  Reviewed course of treatment and expected reaction.  Patient advised to expect inflammation and crusting and advised that  erosions are possible.  Patient advised to be diligent with sun protection during and after treatment. Counseled to keep medication out of reach of children and pets.  Seborrheic Keratoses - Stuck-on, waxy, tan-brown papules and/or plaques  - Benign-appearing - Discussed benign etiology and prognosis. - Observe - Call for any changes  Return for TBSE, as scheduled.  Graciella Belton, RMA, am acting as scribe for Forest Gleason, MD .  Documentation: I have reviewed the above documentation for accuracy and completeness, and I agree with the above.  Forest Gleason, MD

## 2022-07-13 ENCOUNTER — Telehealth: Payer: Self-pay

## 2022-07-13 NOTE — Telephone Encounter (Signed)
Discussed pathology results. Patient voiced understanding.  

## 2022-07-13 NOTE — Telephone Encounter (Signed)
-----   Message from Alfonso Patten, MD sent at 07/12/2022  5:11 PM EST ----- Skin , left pretibia WELL DIFFERENTIATED SQUAMOUS CELL CARCINOMA --> already treated with ED&C. If anything is thickening up/growing back or failing to heal within 2 months at the area, return to clinic. Otherwise recheck at follow-up appointment.   MAs please call. Thank you!

## 2022-07-14 DIAGNOSIS — H2511 Age-related nuclear cataract, right eye: Secondary | ICD-10-CM | POA: Diagnosis not present

## 2022-07-14 DIAGNOSIS — H35342 Macular cyst, hole, or pseudohole, left eye: Secondary | ICD-10-CM | POA: Diagnosis not present

## 2022-07-14 DIAGNOSIS — H26492 Other secondary cataract, left eye: Secondary | ICD-10-CM | POA: Diagnosis not present

## 2022-07-14 DIAGNOSIS — H5203 Hypermetropia, bilateral: Secondary | ICD-10-CM | POA: Diagnosis not present

## 2022-07-18 ENCOUNTER — Other Ambulatory Visit: Payer: Self-pay

## 2022-07-18 ENCOUNTER — Other Ambulatory Visit: Payer: Self-pay | Admitting: Family Medicine

## 2022-07-18 DIAGNOSIS — Z1231 Encounter for screening mammogram for malignant neoplasm of breast: Secondary | ICD-10-CM

## 2022-07-18 MED ORDER — TRIAMCINOLONE ACETONIDE 0.1 % EX CREA: TOPICAL_CREAM | CUTANEOUS | 0 refills | Status: DC

## 2022-07-20 ENCOUNTER — Encounter: Payer: Self-pay | Admitting: Dermatology

## 2022-07-20 ENCOUNTER — Ambulatory Visit (INDEPENDENT_AMBULATORY_CARE_PROVIDER_SITE_OTHER): Payer: Medicare HMO | Admitting: Dermatology

## 2022-07-20 DIAGNOSIS — Z5189 Encounter for other specified aftercare: Secondary | ICD-10-CM

## 2022-07-20 DIAGNOSIS — L578 Other skin changes due to chronic exposure to nonionizing radiation: Secondary | ICD-10-CM

## 2022-07-20 DIAGNOSIS — L570 Actinic keratosis: Secondary | ICD-10-CM | POA: Diagnosis not present

## 2022-07-20 MED ORDER — DOXYCYCLINE MONOHYDRATE 100 MG PO CAPS: 100.0000 mg | ORAL_CAPSULE | Freq: Two times a day (BID) | ORAL | 0 refills | Status: AC

## 2022-07-20 NOTE — Patient Instructions (Addendum)
Cryotherapy Aftercare  Wash gently with soap and water everyday.   Apply Vaseline and Band-Aid daily until healed.    Wound at left lower leg: Cleanse once daily with Hibiclens/chlorhexidine. Apply Mupirocin or Vaseline Jelly/Aquaphor. Cover with J&J  Hydro seal band-aid. Change every 3 days  Start Doxycycline 100 mg twice daily for 7 days if not improving with wound care.   Doxycycline should be taken with food to prevent nausea. Do not lay down for 30 minutes after taking. Be cautious with sun exposure and use good sun protection while on this medication. Pregnant women should not take this medication.    Cleanse once daily with Hibiclens/chlorhexidine. Apply Mupirocin or Vaseline Jelly/Aquaphor. Cover with J&J  Hydro seal band-aid. Change every 3 days  Start Doxycycline 100 mg twice daily for 7 days if not improving with wound care.   Doxycycline should be taken with food to prevent nausea. Do not lay down for 30 minutes after taking. Be cautious with sun exposure and use good sun protection while on this medication. Pregnant women should not take this medication.    - Continue 5-fluorouracil/calcipotriene cream twice a day up to 2 weeks to affected areas including right lower leg and foot. Prescription sent to [facility].   Reviewed course of treatment and expected reaction.  Patient advised to expect inflammation and crusting and advised that erosions are possible.  Patient advised to be diligent with sun protection during and after treatment. Counseled to keep medication out of reach of children and pets.    Due to recent changes in healthcare laws, you may see results of your pathology and/or laboratory studies on MyChart before the doctors have had a chance to review them. We understand that in some cases there may be results that are confusing or concerning to you. Please understand that not all results are received at the same time and often the doctors  may need to interpret multiple results in order to provide you with the best plan of care or course of treatment. Therefore, we ask that you please give [place] 2 business days to thoroughly review all your results before contacting the office for clarification. Should we see a critical lab result, you will be contacted sooner.   If You Need Anything After Your Visit  If you have any questions or concerns for your doctor, please call our main line at [phone] and press option 4 to reach your doctor's medical assistant. If no one answers, please leave a voicemail as directed and we will return your call as soon as possible. Messages left after 4 pm will be answered the following business day.   You may also send [place] a message via [place]. We typically respond to MyChart messages within 1-2 business days.  For prescription refills, please ask your pharmacy to contact our office. Our fax number is [phone].  If you have an urgent issue when the clinic is closed that cannot wait until the next business day, you can page your doctor at the number below.    Please note that while we do our best to be available for urgent issues outside of office hours, we are not available 24/7.   If you have an urgent issue and are unable to reach [place], you may choose to seek medical care at your doctor's office, retail clinic, urgent care center, or emergency room.  If you have a medical emergency, please immediately call 911 or go to the emergency department.  Pager Numbers  -  Dr. Nehemiah Massed: [phone]  - Dr. Laurence Ferrari: [phone]  - Dr. Nicole Kindred: [phone]  In the event of inclement weather, please call our main line at [phone] for an update on the status of any delays or closures.  Dermatology Medication Tips: Please keep the boxes that topical medications come in in order to help keep track of the instructions about where and how to use these. Pharmacies typically print the medication instructions  only on the boxes and not directly on the medication tubes.   If your medication is too expensive, please contact our office at [phone] option 4 or send [place] a message through [place].   We are unable to tell what your co-pay for medications will be in advance as this is different depending on your insurance coverage. However, we may be able to find a substitute medication at lower cost or fill out paperwork to get insurance to cover a needed medication.   If a prior authorization is required to get your medication covered by your insurance company, please allow [place] 1-2 business days to complete this process.  Drug prices often vary depending on where the prescription is filled and some pharmacies may offer cheaper prices.  The website www.goodrx.com contains coupons for medications through different pharmacies. The prices here do not account for what the cost may be with help from insurance (it may be cheaper with your insurance), but the website can give you the price if you did not use any insurance.  - You can print the associated coupon and take it with your prescription to the pharmacy.  - You may also stop by our office during regular business hours and pick up a GoodRx coupon card.  - If you need your prescription sent electronically to a different pharmacy, notify our office through [facility] or by phone at [phone] option 4.     Si Usted Necesita Algo Despus de Su Visita  Tambin puede enviarnos un mensaje a travs de Pharmacist, community. Por lo general respondemos a los mensajes de MyChart en el transcurso de 1 a 2 das hbiles.  Para renovar recetas, por favor pida a su farmacia que se ponga en contacto con nuestra oficina. Harland Dingwall de fax es [place] [phone].  Si tiene un asunto urgente cuando la clnica est cerrada y que no puede esperar hasta el siguiente da hbil, puede llamar/localizar a su doctor(a) al nmero que aparece a continuacin.   Por favor, tenga en  cuenta que aunque hacemos todo lo posible para estar disponibles para asuntos urgentes fuera del horario de [place], no estamos disponibles las 24 horas del da, los 7 das de la [place].   Si tiene un problema urgente y no puede comunicarse con nosotros, puede optar por buscar atencin mdica  en el consultorio de su doctor(a), en una clnica privada, en un centro de atencin urgente o en una sala de emergencias.  Si tiene Engineering geologist, por favor llame inmediatamente al 911 o vaya a la sala de emergencias.  Nmeros de bper  - Dr. Nehemiah Massed: [phone]  - Dra. Moye: [phone]  - Dra. Nicole Kindred: [phone]  En caso de inclemencias del [place], por favor llame a Johnsie Kindred principal al [phone] para una actualizacin sobre el [place] de cualquier retraso o cierre.  Consejos para la medicacin en dermatologa: Por favor, guarde las cajas en las que vienen los medicamentos de uso tpico para ayudarle a seguir las instrucciones sobre dnde y cmo usarlos. [place] instrucciones del medicamento  slo en las cajas y no directamente en los tubos del medicamento.   Si su medicamento es muy caro, por favor, pngase en contacto con Zigmund Daniel llamando al [phone] y presione la opcin 4 o envenos un mensaje a travs de Pharmacist, community.   No podemos decirle cul ser su copago por los medicamentos por adelantado ya que esto es diferente dependiendo de la cobertura de su seguro. Sin embargo, es posible que podamos encontrar un medicamento sustituto a Electrical engineer un formulario para que el seguro cubra el medicamento que se considera necesario.   Si se requiere una autorizacin previa para que su compaa de seguros [place] su medicamento, por favor permtanos de 1 a 2 das hbiles para completar este proceso.  Los precios de los medicamentos varan con frecuencia dependiendo del Environmental consultant de dnde se surte la receta y alguna farmacias pueden ofrecer  precios ms baratos.  El sitio web www.goodrx.com tiene cupones para medicamentos de Airline pilot. Los precios aqu no tienen en cuenta lo que podra costar con la ayuda del seguro (puede ser ms barato con su seguro), pero el sitio web puede darle el precio si no utiliz Research scientist (physical sciences).  - Puede imprimir el cupn correspondiente y llevarlo con su receta a la farmacia.  - Tambin puede pasar por nuestra oficina durante el horario de atencin regular y Charity fundraiser una tarjeta de cupones de GoodRx.  - Si necesita que su receta se enve electrnicamente a una farmacia diferente, informe a nuestra oficina a travs de MyChart de [place] o por telfono llamando al [phone] y presione la opcin 4.

## 2022-07-20 NOTE — Progress Notes (Signed)
Follow-Up Visit   Subjective  [name] is a 67 y.o. female who presents for the following: Skin Problem (SCC site, Tx with [facility] 07/03/2022. C/O redness and pain. Would like to make sure area is healing correctly. Has new spot on left upper arm. Red, rough, tender. ). Also here for recheck of actinic damage at legs and feet.   The patient has spots, moles and lesions to be evaluated, some may be new or changing and the patient has concerns that these could be cancer.  The following portions of the chart were reviewed this encounter and updated as appropriate:  Tobacco  Allergies  Meds  Problems  Med Hx  Surg Hx  Fam Hx      Review of Systems: No other skin or systemic complaints except as noted in HPI or Assessment and Plan.   Objective  Well appearing patient in no apparent distress; mood and affect are within normal limits.  A focused examination was performed including left upper arm, B/L lower legs. Relevant physical exam findings are noted in the Assessment and Plan.  left pretibial See photo     Left Upper Arm x1 Erythematous hyperkeratotic papule   Assessment & Plan  Visit for wound check left pretibial  Favor wound tenderness due to crust and being too dry.   Cleanse once daily with Hibiclens/chlorhexidine. Apply Mupirocin or Vaseline Jelly/Aquaphor and bandage. Alternatively, cleanse with hibiclens and cover with Hydroseal bandage which can stay in place for up to 3 days and does not require ointment underneath.  Start Doxycycline 100 mg twice daily for 7 days if not improving with wound care.   Doxycycline should be taken with food to prevent nausea. Do not lay down for 30 minutes after taking. Be cautious with sun exposure and use good sun protection while on this medication. Pregnant women should not take this medication.    doxycycline (MONODOX) 100 MG capsule - left pretibial Take 1 capsule (100 mg total) by mouth 2 (two) times daily for 7  days. Take with food  Hypertrophic actinic keratosis Left Upper Arm x1  Actinic keratoses are precancerous spots that appear secondary to cumulative UV radiation exposure/sun exposure over time. They are chronic with expected duration over 1 year. A portion of actinic keratoses will progress to squamous cell carcinoma of the skin. It is not possible to reliably predict which spots will progress to skin cancer and so treatment is recommended to prevent development of skin cancer.  Recommend daily broad spectrum sunscreen SPF 30+ to sun-exposed areas, reapply every 2 hours as needed.  Recommend staying in the shade or wearing long sleeves, sun glasses (UVA+UVB protection) and wide brim hats (4-inch brim around the entire circumference of the hat). Call for new or changing lesions.  Recommend biopsy today. Patient prefers LN2 > biopsy today. RTC 2-3 months if not resolved, will biopsy.   Destruction of lesion - Left Upper Arm x1  Destruction method: cryotherapy   Informed consent: discussed and consent obtained   Lesion destroyed using liquid nitrogen: Yes   Region frozen until ice ball extended beyond lesion: Yes   Outcome: patient tolerated procedure well with no complications   Post-procedure details: wound care instructions given   Additional details:  Prior to procedure, discussed risks of blister formation, small wound, skin dyspigmentation, or rare scar following cryotherapy. Recommend Vaseline ointment to treated areas while healing.    Actinic Damage with PreCancerous Actinic Keratoses Counseling for Topical Chemotherapy Management: Patient exhibits: -  Severe, confluent actinic changes with pre-cancerous actinic keratoses that is secondary to cumulative UV radiation exposure over time - Condition that is severe; chronic, not at goal. - diffuse scaly erythematous macules and papules with underlying dyspigmentation - Discussed Prescription "Field Treatment" topical Chemotherapy for  Severe, Chronic Confluent Actinic Changes with Pre-Cancerous Actinic Keratoses Field treatment involves treatment of an entire area of skin that has confluent Actinic Changes (Sun/ Ultraviolet light damage) and PreCancerous Actinic Keratoses by method of PhotoDynamic Therapy (PDT) and/or prescription Topical Chemotherapy agents such as 5-fluorouracil, 5-fluorouracil/calcipotriene, and/or imiquimod.  The purpose is to decrease the number of clinically evident and subclinical PreCancerous lesions to prevent progression to development of skin cancer by chemically destroying early precancer changes that may or may not be visible.  It has been shown to reduce the risk of developing skin cancer in the treated area. As a result of treatment, redness, scaling, crusting, and open sores may occur during treatment course. One or more than one of these methods may be used and may have to be used several times to control, suppress and eliminate the PreCancerous changes. Discussed treatment course, expected reaction, and possible side effects. - Recommend daily broad spectrum sunscreen SPF 30+ to sun-exposed areas, reapply every 2 hours as needed.  - Staying in the shade or wearing long sleeves, sun glasses (UVA+UVB protection) and wide brim hats (4-inch brim around the entire circumference of the hat) are also recommended. - Call for new or changing lesions.  - Inadequate response to 5FU/calcipotriene.  Continue 5-fluorouracil/calcipotriene cream twice a day up to 2 weeks to affected areas including right lower leg and foot. Prescription sent to [facility].   Reviewed course of treatment and expected reaction.  Patient advised to expect inflammation and crusting and advised that erosions are possible.  Patient advised to be diligent with sun protection during and after treatment. Counseled to keep medication out of reach of children and pets.   If continues to not react call office. Will send  in Fluorouracil 5% cream and Calcipotriene cream (1 tube of each) to use together.   Return for Leg Follow Up 4-5 weeks, AK Follow Up.  I, Emelia Salisbury, CMA, am acting as scribe for Forest Gleason, MD.  Documentation: I have reviewed the above documentation for accuracy and completeness, and I agree with the above.  Forest Gleason, MD

## 2022-07-27 ENCOUNTER — Ambulatory Visit: Payer: PPO

## 2022-08-01 ENCOUNTER — Encounter: Payer: Self-pay | Admitting: Dermatology

## 2022-08-07 DIAGNOSIS — G4733 Obstructive sleep apnea (adult) (pediatric): Secondary | ICD-10-CM | POA: Diagnosis not present

## 2022-08-15 DIAGNOSIS — G4733 Obstructive sleep apnea (adult) (pediatric): Secondary | ICD-10-CM | POA: Diagnosis not present

## 2022-08-24 ENCOUNTER — Ambulatory Visit (INDEPENDENT_AMBULATORY_CARE_PROVIDER_SITE_OTHER): Payer: Medicare HMO | Admitting: Dermatology

## 2022-08-24 DIAGNOSIS — L578 Other skin changes due to chronic exposure to nonionizing radiation: Secondary | ICD-10-CM | POA: Diagnosis not present

## 2022-08-24 DIAGNOSIS — Z5189 Encounter for other specified aftercare: Secondary | ICD-10-CM

## 2022-08-24 DIAGNOSIS — L570 Actinic keratosis: Secondary | ICD-10-CM | POA: Diagnosis not present

## 2022-08-24 NOTE — Progress Notes (Signed)
Follow-Up Visit   Subjective  [name] is a 67 y.o. female who presents for the following: Follow-up (Patient here for wound follow up at left pretibia. Patient took 7 days of doxycycline 100 mg twice daily and is still using Vaseline. ) and Actinic Keratosis (Patient treated right lower leg for 2 1/2 weeks and then started treatment at right arm finishing this Monday with 5FU/calcipotriene with good response. Also a spot at left thigh that she has been treating.).  The following portions of the chart were reviewed this encounter and updated as appropriate:   Tobacco  Allergies  Meds  Problems  Med Hx  Surg Hx  Fam Hx      Review of Systems:  No other skin or systemic complaints except as noted in HPI or Assessment and Plan.  Objective  Well appearing patient in no apparent distress; mood and affect are within normal limits.  A focused examination was performed including arms, legs. Relevant physical exam findings are noted in the Assessment and Plan.    Assessment & Plan  Encounter for wound re-check left pretibia  Healing well.  Recommend using hydrocolloid blister Band-Aids. Cleanse and apply bandage. Can leave on up to 3 days.  Actinic Damage with PreCancerous Actinic Keratoses Counseling for Topical Chemotherapy Management: Patient exhibits: - Severe, confluent actinic changes with pre-cancerous actinic keratoses that is secondary to cumulative UV radiation exposure over time - Condition that is severe; chronic, not at goal. - diffuse scaly erythematous macules and papules with underlying dyspigmentation - Discussed Prescription "Field Treatment" topical Chemotherapy for Severe, Chronic Confluent Actinic Changes with Pre-Cancerous Actinic Keratoses Field treatment involves treatment of an entire area of skin that has confluent Actinic Changes (Sun/ Ultraviolet light damage) and PreCancerous Actinic Keratoses by method of PhotoDynamic Therapy (PDT) and/or  prescription Topical Chemotherapy agents such as 5-fluorouracil, 5-fluorouracil/calcipotriene, and/or imiquimod.  The purpose is to decrease the number of clinically evident and subclinical PreCancerous lesions to prevent progression to development of skin cancer by chemically destroying early precancer changes that may or may not be visible.  It has been shown to reduce the risk of developing skin cancer in the treated area. As a result of treatment, redness, scaling, crusting, and open sores may occur during treatment course. One or more than one of these methods may be used and may have to be used several times to control, suppress and eliminate the PreCancerous changes. Discussed treatment course, expected reaction, and possible side effects. - Recommend daily broad spectrum sunscreen SPF 30+ to sun-exposed areas, reapply every 2 hours as needed.  - Staying in the shade or wearing long sleeves, sun glasses (UVA+UVB protection) and wide brim hats (4-inch brim around the entire circumference of the hat) are also recommended. - Call for new or changing lesions. - May repeat next fall. Consider sending in separate tubes of calcipotriene and 5FU. - Start 5-fluorouracil/calcipotriene cream twice a day for 7 days to affected areas including left arm and hand, left lower leg and foot. Prescription sent to [facility]. Patient advised they will receive an email to purchase the medication online and have it sent to their home. Patient provided with handout reviewing treatment course and side effects and advised to call or message [place] on MyChart with any concerns.  Reviewed course of treatment and expected reaction.  Patient advised to expect inflammation and crusting and advised that erosions are possible.  Patient advised to be diligent with sun protection during and after treatment.  Counseled to keep medication out of reach of children and pets.  Return for as scheduled.  Graciella Belton, RMA, am acting as scribe for Forest Gleason, MD .  Documentation: I have reviewed the above documentation for accuracy and completeness, and I agree with the above.  Forest Gleason, MD

## 2022-08-24 NOTE — Patient Instructions (Addendum)
5-Fluorouracil/Calcipotriene Patient Education   Actinic keratoses are the dry, red scaly spots on the skin caused by sun damage. A portion of these spots can turn into skin cancer with time, and treating them can help prevent development of skin cancer.   Treatment of these spots requires removal of the defective skin cells. There are various ways to remove actinic keratoses, including freezing with liquid nitrogen, treatment with creams, or treatment with a blue light procedure in the office.   5-fluorouracil cream is a topical cream used to treat actinic keratoses. It works by interfering with the growth of abnormal fast-growing skin cells, such as actinic keratoses. These cells peel off and are replaced by healthy ones.   5-fluorouracil/calcipotriene is a combination of the 5-fluorouracil cream with a vitamin D analog cream called calcipotriene. The calcipotriene alone does not treat actinic keratoses. However, when it is combined with 5-fluorouracil, it helps the 5-fluorouracil treat the actinic keratoses much faster so that the same results can be achieved with a much shorter treatment time.  INSTRUCTIONS FOR 5-FLUOROURACIL/CALCIPOTRIENE CREAM:   5-fluorouracil/calcipotriene cream typically only needs to be used for 4-7 days. A thin layer should be applied twice a day to the treatment areas recommended by your physician.   If your physician prescribed you separate tubes of 5-fluourouracil and calcipotriene, apply a thin layer of 5-fluorouracil followed by a thin layer of calcipotriene.   Avoid contact with your eyes, nostrils, and mouth. Do not use 5-fluorouracil/calcipotriene cream on infected or open wounds.   You will develop redness, irritation and some crusting at areas where you have pre-cancer damage/actinic keratoses. IF YOU DEVELOP PAIN, BLEEDING, OR SIGNIFICANT CRUSTING, STOP THE TREATMENT EARLY - you have already gotten a good response and the actinic keratoses should clear up  well.  Wash your hands after applying 5-fluorouracil 5% cream on your skin.   A moisturizer or sunscreen with a minimum SPF 30 should be applied each morning.   Once you have finished the treatment, you can apply a thin layer of Vaseline twice a day to irritated areas to soothe and calm the areas more quickly. If you experience significant discomfort, contact your physician.  For some patients it is necessary to repeat the treatment for best results.  SIDE EFFECTS: When using 5-fluorouracil/calcipotriene cream, you may have mild irritation, such as redness, dryness, swelling, or a mild burning sensation. This usually resolves within 2 weeks. The more actinic keratoses you have, the more redness and inflammation you can expect during treatment. Eye irritation has been reported rarely. If this occurs, please let us know.  If you have any trouble using this cream, please call the office. If you have any other questions about this information, please do not hesitate to ask me before you leave the office.  - Start 5-fluorouracil/calcipotriene cream twice a day for 7 days to affected areas including left arm and hand, left lower leg and foot. Prescription sent to [facility]. Patient advised they will receive an email to purchase the medication online and have it sent to their home. Patient provided with handout reviewing treatment course and side effects and advised to call or message [place] on MyChart with any concerns.  Reviewed course of treatment and expected reaction.  Patient advised to expect inflammation and crusting and advised that erosions are possible.  Patient advised to be diligent with sun protection during and after treatment. Counseled to keep medication out of reach of children and pets.  Due to recent changes in  healthcare laws, you may see results of your pathology and/or laboratory studies on MyChart before the doctors have had a chance to review them. We  understand that in some cases there may be results that are confusing or concerning to you. Please understand that not all results are received at the same time and often the doctors may need to interpret multiple results in order to provide you with the best plan of care or course of treatment. Therefore, we ask that you please give [place] 2 business days to thoroughly review all your results before contacting the office for clarification. Should we see a critical lab result, you will be contacted sooner.   If You Need Anything After Your Visit  If you have any questions or concerns for your doctor, please call our main line at [phone] and press option 4 to reach your doctor's medical assistant. If no one answers, please leave a voicemail as directed and we will return your call as soon as possible. Messages left after 4 pm will be answered the following business day.   You may also send [place] a message via [place]. We typically respond to MyChart messages within 1-2 business days.  For prescription refills, please ask your pharmacy to contact our office. Our fax number is [phone].  If you have an urgent issue when the clinic is closed that cannot wait until the next business day, you can page your doctor at the number below.    Please note that while we do our best to be available for urgent issues outside of office hours, we are not available 24/7.   If you have an urgent issue and are unable to reach [place], you may choose to seek medical care at your doctor's office, retail clinic, urgent care center, or emergency room.  If you have a medical emergency, please immediately call 911 or go to the emergency department.  Pager Numbers  - Dr. Nehemiah Massed: [phone]  - Dr. Laurence Ferrari: [phone]  - Dr. Nicole Kindred: [phone]  In the event of inclement weather, please call our main line at [phone] for an update on the status of any delays or closures.  Dermatology Medication Tips: Please  keep the boxes that topical medications come in in order to help keep track of the instructions about where and how to use these. Pharmacies typically print the medication instructions only on the boxes and not directly on the medication tubes.   If your medication is too expensive, please contact our office at [phone] option 4 or send [place] a message through [place].   We are unable to tell what your co-pay for medications will be in advance as this is different depending on your insurance coverage. However, we may be able to find a substitute medication at lower cost or fill out paperwork to get insurance to cover a needed medication.   If a prior authorization is required to get your medication covered by your insurance company, please allow [place] 1-2 business days to complete this process.  Drug prices often vary depending on where the prescription is filled and some pharmacies may offer cheaper prices.  The website www.goodrx.com contains coupons for medications through different pharmacies. The prices here do not account for what the cost may be with help from insurance (it may be cheaper with your insurance), but the website can give you the price if you did not use any insurance.  - You can print the associated coupon and take it with your prescription to the  pharmacy.  - You may also stop by our office during regular business hours and pick up a GoodRx coupon card.  - If you need your prescription sent electronically to a different pharmacy, notify our office through [facility] or by phone at [phone] option 4.     Si Usted Necesita Algo Despus de Su Visita  Tambin puede enviarnos un mensaje a travs de Pharmacist, community. Por lo general respondemos a los mensajes de MyChart en el transcurso de 1 a 2 das hbiles.  Para renovar recetas, por favor pida a su farmacia que se ponga en contacto con nuestra oficina. Harland Dingwall de fax es [place] [phone].  Si tiene un asunto urgente  cuando la clnica est cerrada y que no puede esperar hasta el siguiente da hbil, puede llamar/localizar a su doctor(a) al nmero que aparece a continuacin.   Por favor, tenga en cuenta que aunque hacemos todo lo posible para estar disponibles para asuntos urgentes fuera del horario de [place], no estamos disponibles las 24 horas del da, los 7 das de la [place].   Si tiene un problema urgente y no puede comunicarse con nosotros, puede optar por buscar atencin mdica  en el consultorio de su doctor(a), en una clnica privada, en un centro de atencin urgente o en una sala de emergencias.  Si tiene Engineering geologist, por favor llame inmediatamente al 911 o vaya a la sala de emergencias.  Nmeros de bper  - Dr. Nehemiah Massed: [phone]  - Dra. Moye: [phone]  - Dra. Nicole Kindred: [phone]  En caso de inclemencias del [place], por favor llame a Johnsie Kindred principal al [phone] para una actualizacin sobre el [place] de cualquier retraso o cierre.  Consejos para la medicacin en dermatologa: Por favor, guarde las cajas en las que vienen los medicamentos de uso tpico para ayudarle a seguir las instrucciones sobre dnde y cmo usarlos. Las farmacias generalmente imprimen las instrucciones del medicamento slo en las cajas y no directamente en los tubos del [place].   Si su medicamento es muy caro, por favor, pngase en contacto con Zigmund Daniel llamando al [phone] y presione la opcin 4 o envenos un mensaje a travs de Pharmacist, community.   No podemos decirle cul ser su copago por los medicamentos por adelantado ya que esto es diferente dependiendo de la cobertura de su seguro. Sin embargo, es posible que podamos encontrar un medicamento sustituto a Electrical engineer un formulario para que el seguro cubra el medicamento que se considera necesario.   Si se requiere una autorizacin previa para que su compaa de seguros [place] su medicamento, por favor permtanos de 1 a 2  das hbiles para completar este proceso.  Los precios de los medicamentos varan con frecuencia dependiendo del Environmental consultant de dnde se surte la receta y alguna farmacias pueden ofrecer precios ms baratos.  El sitio web www.goodrx.com tiene cupones para medicamentos de Airline pilot. Los precios aqu no tienen en cuenta lo que podra costar con la ayuda del seguro (puede ser ms barato con su seguro), pero el sitio web puede darle el precio si no utiliz Research scientist (physical sciences).  - Puede imprimir el cupn correspondiente y llevarlo con su receta a la farmacia.  - Tambin puede pasar por nuestra oficina durante el horario de atencin regular y Charity fundraiser una tarjeta de cupones de GoodRx.  - Si necesita que su receta se enve electrnicamente a Chiropodist, informe a nuestra oficina a travs de MyChart de [place] o por telfono llamando  al [phone] y presione la opcin 4.

## 2022-09-05 ENCOUNTER — Encounter: Payer: Self-pay | Admitting: Dermatology

## 2022-09-06 ENCOUNTER — Ambulatory Visit
Admission: RE | Admit: 2022-09-06 | Discharge: 2022-09-06 | Disposition: A | Discharge status: Home / Self Care | Payer: BC Managed Care – PPO | Source: Ambulatory Visit | Attending: Family Medicine | Admitting: Family Medicine

## 2022-09-06 DIAGNOSIS — Z1231 Encounter for screening mammogram for malignant neoplasm of breast: Secondary | ICD-10-CM | POA: Diagnosis not present

## 2022-09-14 DIAGNOSIS — G4733 Obstructive sleep apnea (adult) (pediatric): Secondary | ICD-10-CM | POA: Diagnosis not present

## 2022-10-03 DIAGNOSIS — R69 Illness, unspecified: Secondary | ICD-10-CM | POA: Diagnosis not present

## 2022-10-14 DIAGNOSIS — G4733 Obstructive sleep apnea (adult) (pediatric): Secondary | ICD-10-CM | POA: Diagnosis not present

## 2022-10-19 DIAGNOSIS — R5381 Other malaise: Secondary | ICD-10-CM | POA: Insufficient documentation

## 2022-10-19 DIAGNOSIS — M9905 Segmental and somatic dysfunction of pelvic region: Secondary | ICD-10-CM | POA: Diagnosis not present

## 2022-10-19 DIAGNOSIS — M9902 Segmental and somatic dysfunction of thoracic region: Secondary | ICD-10-CM | POA: Diagnosis not present

## 2022-10-19 DIAGNOSIS — M9906 Segmental and somatic dysfunction of lower extremity: Secondary | ICD-10-CM | POA: Diagnosis not present

## 2022-10-19 DIAGNOSIS — M7542 Impingement syndrome of left shoulder: Secondary | ICD-10-CM | POA: Diagnosis not present

## 2022-10-19 DIAGNOSIS — M47812 Spondylosis without myelopathy or radiculopathy, cervical region: Secondary | ICD-10-CM | POA: Diagnosis not present

## 2022-10-19 DIAGNOSIS — K642 Third degree hemorrhoids: Secondary | ICD-10-CM | POA: Diagnosis not present

## 2022-10-19 DIAGNOSIS — E7800 Pure hypercholesterolemia, unspecified: Secondary | ICD-10-CM | POA: Insufficient documentation

## 2022-10-19 DIAGNOSIS — M9901 Segmental and somatic dysfunction of cervical region: Secondary | ICD-10-CM | POA: Diagnosis not present

## 2022-10-19 DIAGNOSIS — M9907 Segmental and somatic dysfunction of upper extremity: Secondary | ICD-10-CM | POA: Diagnosis not present

## 2022-10-19 DIAGNOSIS — M7541 Impingement syndrome of right shoulder: Secondary | ICD-10-CM | POA: Diagnosis not present

## 2022-10-24 DIAGNOSIS — M9905 Segmental and somatic dysfunction of pelvic region: Secondary | ICD-10-CM | POA: Diagnosis not present

## 2022-10-24 DIAGNOSIS — M7541 Impingement syndrome of right shoulder: Secondary | ICD-10-CM | POA: Diagnosis not present

## 2022-10-24 DIAGNOSIS — M7542 Impingement syndrome of left shoulder: Secondary | ICD-10-CM | POA: Diagnosis not present

## 2022-10-24 DIAGNOSIS — M9901 Segmental and somatic dysfunction of cervical region: Secondary | ICD-10-CM | POA: Diagnosis not present

## 2022-10-24 DIAGNOSIS — M9906 Segmental and somatic dysfunction of lower extremity: Secondary | ICD-10-CM | POA: Diagnosis not present

## 2022-10-24 DIAGNOSIS — M9902 Segmental and somatic dysfunction of thoracic region: Secondary | ICD-10-CM | POA: Diagnosis not present

## 2022-10-24 DIAGNOSIS — M47812 Spondylosis without myelopathy or radiculopathy, cervical region: Secondary | ICD-10-CM | POA: Diagnosis not present

## 2022-10-24 DIAGNOSIS — M9907 Segmental and somatic dysfunction of upper extremity: Secondary | ICD-10-CM | POA: Diagnosis not present

## 2022-10-26 ENCOUNTER — Encounter: Payer: Self-pay | Admitting: Dermatology

## 2022-10-26 ENCOUNTER — Ambulatory Visit (INDEPENDENT_AMBULATORY_CARE_PROVIDER_SITE_OTHER): Payer: Medicare HMO | Admitting: Dermatology

## 2022-10-26 VITALS — BP 129/89 | HR 75

## 2022-10-26 DIAGNOSIS — L814 Other melanin hyperpigmentation: Secondary | ICD-10-CM | POA: Diagnosis not present

## 2022-10-26 DIAGNOSIS — M7542 Impingement syndrome of left shoulder: Secondary | ICD-10-CM | POA: Diagnosis not present

## 2022-10-26 DIAGNOSIS — Z1283 Encounter for screening for malignant neoplasm of skin: Secondary | ICD-10-CM | POA: Diagnosis not present

## 2022-10-26 DIAGNOSIS — L570 Actinic keratosis: Secondary | ICD-10-CM | POA: Diagnosis not present

## 2022-10-26 DIAGNOSIS — M7541 Impingement syndrome of right shoulder: Secondary | ICD-10-CM | POA: Diagnosis not present

## 2022-10-26 DIAGNOSIS — M9902 Segmental and somatic dysfunction of thoracic region: Secondary | ICD-10-CM | POA: Diagnosis not present

## 2022-10-26 DIAGNOSIS — M9901 Segmental and somatic dysfunction of cervical region: Secondary | ICD-10-CM | POA: Diagnosis not present

## 2022-10-26 DIAGNOSIS — M9907 Segmental and somatic dysfunction of upper extremity: Secondary | ICD-10-CM | POA: Diagnosis not present

## 2022-10-26 DIAGNOSIS — Z85828 Personal history of other malignant neoplasm of skin: Secondary | ICD-10-CM

## 2022-10-26 DIAGNOSIS — Z86018 Personal history of other benign neoplasm: Secondary | ICD-10-CM | POA: Diagnosis not present

## 2022-10-26 DIAGNOSIS — M9906 Segmental and somatic dysfunction of lower extremity: Secondary | ICD-10-CM | POA: Diagnosis not present

## 2022-10-26 DIAGNOSIS — L578 Other skin changes due to chronic exposure to nonionizing radiation: Secondary | ICD-10-CM | POA: Diagnosis not present

## 2022-10-26 DIAGNOSIS — L821 Other seborrheic keratosis: Secondary | ICD-10-CM

## 2022-10-26 DIAGNOSIS — Z86007 Personal history of in-situ neoplasm of skin: Secondary | ICD-10-CM | POA: Diagnosis not present

## 2022-10-26 DIAGNOSIS — M9905 Segmental and somatic dysfunction of pelvic region: Secondary | ICD-10-CM | POA: Diagnosis not present

## 2022-10-26 DIAGNOSIS — M47812 Spondylosis without myelopathy or radiculopathy, cervical region: Secondary | ICD-10-CM | POA: Diagnosis not present

## 2022-10-26 NOTE — Patient Instructions (Addendum)
In fall / winter    Your prescription was sent to [facility] in [place]. A representative from [employer] will contact you within 2 business hours to verify your address and insurance information to schedule a free delivery. If for any reason you do not receive a phone call from them, please reach out to them. Their phone number is [phone] and their hours are Monday-Friday 9:00 am-5:00 pm.    In fall, start 5-fluorouracil cream followed by calcipotriene cream twice a day for 7 days to affected areas at the body including arms, hands, legs, feet, chest.    5-Fluorouracil/Calcipotriene Patient Education   Actinic keratoses are the dry, red scaly spots on the skin caused by sun damage. A portion of these spots can turn into skin cancer with time, and treating them can help prevent development of skin cancer.   Treatment of these spots requires removal of the defective skin cells. There are various ways to remove actinic keratoses, including freezing with liquid nitrogen, treatment with creams, or treatment with a blue light procedure in the office.   5-fluorouracil cream is a topical cream used to treat actinic keratoses. It works by interfering with the growth of abnormal fast-growing skin cells, such as actinic keratoses. These cells peel off and are replaced by healthy ones.   5-fluorouracil/calcipotriene is a combination of the 5-fluorouracil cream with a vitamin D analog cream called calcipotriene. The calcipotriene alone does not treat actinic keratoses. However, when it is combined with 5-fluorouracil, it helps the 5-fluorouracil treat the actinic keratoses much faster so that the same results can be achieved with a much shorter treatment time.  INSTRUCTIONS FOR 5-FLUOROURACIL/CALCIPOTRIENE CREAM:   5-fluorouracil/calcipotriene cream typically only needs to be used for 4-7 days. A thin layer should be applied twice a day to the treatment areas recommended by your physician.    If your physician prescribed you separate tubes of 5-fluourouracil and calcipotriene, apply a thin layer of 5-fluorouracil followed by a thin layer of calcipotriene.   Avoid contact with your eyes, nostrils, and mouth. Do not use 5-fluorouracil/calcipotriene cream on infected or open wounds.   You will develop redness, irritation and some crusting at areas where you have pre-cancer damage/actinic keratoses. IF YOU DEVELOP PAIN, BLEEDING, OR SIGNIFICANT CRUSTING, STOP THE TREATMENT EARLY - you have already gotten a good response and the actinic keratoses should clear up well.  Wash your hands after applying 5-fluorouracil 5% cream on your skin.   A moisturizer or sunscreen with a minimum SPF 30 should be applied each morning.   Once you have finished the treatment, you can apply a thin layer of Vaseline twice a day to irritated areas to soothe and calm the areas more quickly. If you experience significant discomfort, contact your physician.  For some patients it is necessary to repeat the treatment for best results.  SIDE EFFECTS: When using 5-fluorouracil/calcipotriene cream, you may have mild irritation, such as redness, dryness, swelling, or a mild burning sensation. This usually resolves within 2 weeks. The more actinic keratoses you have, the more redness and inflammation you can expect during treatment. Eye irritation has been reported rarely. If this occurs, please let us know.  If you have any trouble using this cream, please call the office. If you have any other questions about this information, please do not hesitate to ask me before you leave the office.      Actinic keratoses are precancerous spots that appear secondary to cumulative UV radiation exposure/sun exposure over  time. They are chronic with expected duration over 1 year. A portion of actinic keratoses will progress to squamous cell carcinoma of the skin. It is not possible to reliably predict which spots will progress  to skin cancer and so treatment is recommended to prevent development of skin cancer.  Recommend daily broad spectrum sunscreen SPF 30+ to sun-exposed areas, reapply every 2 hours as needed.  Recommend staying in the shade or wearing long sleeves, sun glasses (UVA+UVB protection) and wide brim hats (4-inch brim around the entire circumference of the hat). Call for new or changing lesions.   Cryotherapy Aftercare  Wash gently with soap and water everyday.   Apply Vaseline and Band-Aid daily until healed.     Melanoma ABCDEs  Melanoma is the most dangerous type of skin cancer, and is the leading cause of death from skin disease.  You are more likely to develop melanoma if you: Have light-colored skin, light-colored eyes, or red or blond hair Spend a lot of time in the sun Tan regularly, either outdoors or in a tanning bed Have had blistering sunburns, especially during childhood Have a close family member who has had a melanoma Have atypical moles or large birthmarks  Early detection of melanoma is key since treatment is typically straightforward and cure rates are extremely high if we catch it early.   The first sign of melanoma is often a change in a mole or a new dark spot.  The ABCDE system is a way of remembering the signs of melanoma.  A for asymmetry:  The two halves do not match. B for border:  The edges of the growth are irregular. C for color:  A mixture of colors are present instead of an even brown color. D for diameter:  Melanomas are usually (but not always) greater than 6mm - the size of a pencil eraser. E for evolution:  The spot keeps changing in size, shape, and color.  Please check your skin once per month between visits. You can use a small mirror in front and a large mirror behind you to keep an eye on the back side or your body.   If you see any new or changing lesions before your next follow-up, please call to schedule a visit.  Please continue daily skin  protection including broad spectrum sunscreen SPF 30+ to sun-exposed areas, reapplying every 2 hours as needed when you're outdoors.   Staying in the shade or wearing long sleeves, sun glasses (UVA+UVB protection) and wide brim hats (4-inch brim around the entire circumference of the hat) are also recommended for sun protection.    Due to recent changes in healthcare laws, you may see results of your pathology and/or laboratory studies on MyChart before the doctors have had a chance to review them. We understand that in some cases there may be results that are confusing or concerning to you. Please understand that not all results are received at the same time and often the doctors may need to interpret multiple results in order to provide you with the best plan of care or course of treatment. Therefore, we ask that you please give us 2 business days to thoroughly review all your results before contacting the office for clarification. Should we see a critical lab result, you will be contacted sooner.   If You Need Anything After Your Visit  If you have any questions or concerns for your doctor, please call our main line at [phone] and press option 4 to reach  your doctor's medical assistant. If no one answers, please leave a voicemail as directed and we will return your call as soon as possible. Messages left after 4 pm will be answered the following business day.   You may also send [place] a message via MyChart. We typically respond to MyChart messages within 1-2 business days.  For prescription refills, please ask your pharmacy to contact our office. Our fax number is [phone].  If you have an urgent issue when the clinic is closed that cannot wait until the next business day, you can page your doctor at the number below.    Please note that while we do our best to be available for urgent issues outside of office hours, we are not available 24/7.   If you have an urgent issue and are unable  to reach [place], you may choose to seek medical care at your doctor's office, retail clinic, urgent care center, or emergency room.  If you have a medical emergency, please immediately call 911 or go to the emergency department.  Pager Numbers  - Dr. Gwen Pounds: [phone]  - Dr. Neale Burly: [phone]  - Dr. Roseanne Reno: [phone]  In the event of inclement weather, please call our main line at [phone] for an update on the status of any delays or closures.  Dermatology Medication Tips: Please keep the boxes that topical medications come in in order to help keep track of the instructions about where and how to use these. Pharmacies typically print the medication instructions only on the boxes and not directly on the medication tubes.   If your medication is too expensive, please contact our office at [phone] option 4 or send [place] a message through MyChart.   We are unable to tell what your co-pay for medications will be in advance as this is different depending on your insurance coverage. However, we may be able to find a substitute medication at lower cost or fill out paperwork to get insurance to cover a needed medication.   If a prior authorization is required to get your medication covered by your insurance company, please allow [place] 1-2 business days to complete this process.  Drug prices often vary depending on where the prescription is filled and some pharmacies may offer cheaper prices.  The website www.goodrx.com contains coupons for medications through different pharmacies. The prices here do not account for what the cost may be with help from insurance (it may be cheaper with your insurance), but the website can give you the price if you did not use any insurance.  - You can print the associated coupon and take it with your prescription to the pharmacy.  - You may also stop by our office during regular business hours and pick up a GoodRx coupon card.  - If you need your  prescription sent electronically to a different pharmacy, notify our office through [facility] or by phone at [phone] option 4.     Si Usted Necesita Algo Despus de Su Visita  Tambin puede enviarnos un mensaje a travs de Clinical cytogeneticist. Por lo general respondemos a los mensajes de MyChart en el transcurso de 1 a 2 das hbiles.  Para renovar recetas, por favor pida a su farmacia que se ponga en contacto con nuestra oficina. Annie Sable de fax es [place] [phone].  Si tiene un asunto urgente cuando la clnica est cerrada y que no puede esperar hasta el siguiente da hbil, puede llamar/localizar a su doctor(a) al nmero que aparece a continuacin.  Por favor, tenga en cuenta que aunque hacemos todo lo posible para estar disponibles para asuntos urgentes fuera del horario de [place], no estamos disponibles las 24 horas del da, los 7 [address]  [address] de la [place].   Si tiene un problema urgente y no puede comunicarse con nosotros, puede optar por buscar atencin mdica  en el consultorio de su doctor(a), en una clnica privada, en un centro de atencin urgente o en una sala de emergencias.  Si tiene Engineer, drilling, por favor llame inmediatamente al 911 o vaya a la sala de emergencias.  Nmeros de bper  - Dr. Gwen Pounds: [phone]  - Dra. Veera Stapleton: [phone]  - Dra. Roseanne Reno: [phone]  En caso de inclemencias del [place], por favor llame a Lacy Duverney principal al [phone] para una actualizacin sobre el [place] de cualquier retraso o cierre.  Consejos para la medicacin en dermatologa: Por favor, guarde las cajas en las que vienen los medicamentos de uso tpico para ayudarle a seguir las instrucciones sobre dnde y cmo usarlos. Las farmacias generalmente imprimen las instrucciones del medicamento slo en las cajas y no directamente en los tubos del [place].   Si su medicamento es muy caro, por favor, pngase en contacto con Rolm Gala llamando al [phone]  y presione la opcin 4 o envenos un mensaje a travs de Clinical cytogeneticist.   No podemos decirle cul ser su copago por los medicamentos por adelantado ya que esto es diferente dependiendo de la cobertura de su seguro. Sin embargo, es posible que podamos encontrar un medicamento sustituto a Audiological scientist un formulario para que el seguro cubra el medicamento que se considera necesario.   Si se requiere una autorizacin previa para que su compaa de seguros [place] su medicamento, por favor permtanos de 1 a 2 das hbiles para completar [address]  Los precios de los medicamentos varan con frecuencia dependiendo del Environmental consultant de dnde se surte la receta y alguna farmacias pueden ofrecer precios ms baratos.  El sitio web www.goodrx.com tiene cupones para medicamentos de Health and safety inspector. Los precios aqu no tienen en cuenta lo que podra costar con la ayuda del seguro (puede ser ms barato con su seguro), pero el sitio web puede darle el precio si no utiliz Tourist information centre manager.  - Puede imprimir el cupn correspondiente y llevarlo con su receta a la farmacia.  - Tambin puede pasar por nuestra oficina durante el horario de atencin regular y Education officer, museum una tarjeta de cupones de GoodRx.  - Si necesita que su receta se enve electrnicamente a una farmacia diferente, informe a nuestra oficina a travs de MyChart de [place] o por telfono llamando al [phone] y presione la opcin 4.

## 2022-10-26 NOTE — Progress Notes (Signed)
Follow-Up Visit   Subjective  [name] is a 67 y.o. female who presents for the following: Skin Cancer Screening and Full Body Skin Exam  Patient used 5 f/u calcipotriene field treatment cream to treat aks at Left arm and hand , right arm and hand,  chest, right lower leg and foot. Patient did not start cream at left leg or foot.    The patient presents for Total-Body Skin Exam (TBSE) for skin cancer screening and mole check. The patient has spots, moles and lesions to be evaluated, some may be new or changing and the patient has concerns that these could be cancer.    The following portions of the chart were reviewed this encounter and updated as appropriate: medications, allergies, medical history  Review of Systems:  No other skin or systemic complaints except as noted in HPI or Assessment and Plan.  Objective  Well appearing patient in no apparent distress; mood and affect are within normal limits.  A full examination was performed including scalp, head, eyes, ears, nose, lips, neck, chest, axillae, abdomen, back, buttocks, bilateral upper extremities, bilateral lower extremities, hands, feet, fingers, toes, fingernails, and toenails. All findings within normal limits unless otherwise noted below.   Relevant physical exam findings are noted in the Assessment and Plan.    Assessment & Plan    ACTINIC KERATOSIS, hypertrophic Exam: Erythematous thin papules/macules with thicker gritty scale  Actinic keratoses are precancerous spots that appear secondary to cumulative UV radiation exposure/sun exposure over time. They are chronic with expected duration over 1 year. A portion of actinic keratoses will progress to squamous cell carcinoma of the skin. It is not possible to reliably predict which spots will progress to skin cancer and so treatment is recommended to prevent development of skin cancer.  Recommend daily broad spectrum sunscreen SPF 30+ to sun-exposed areas,  reapply every 2 hours as needed.  Recommend staying in the shade or wearing long sleeves, sun glasses (UVA+UVB protection) and wide brim hats (4-inch brim around the entire circumference of the hat). Call for new or changing lesions.  Treatment Plan:  Discussed bx to r/o sccis vs ak at mid chest, Defers treatment, Patient prefers LN2 treatment and recheck in 6 weeks.   Patient reports picking and rubbing at area at mid chest  Prior to procedure, discussed risks of blister formation, small wound, skin dyspigmentation, or rare scar following cryotherapy. Recommend Vaseline ointment to treated areas while healing.  Destruction Procedure Note Destruction method: cryotherapy   Informed consent: discussed and consent obtained   Lesion destroyed using liquid nitrogen: Yes   Outcome: patient tolerated procedure well with no complications   Post-procedure details: wound care instructions given   Locations: left posterior calf x 1, right upper arm x 1, right antecubital fossa x 1 , mid chest x 1, left medial calf x 1 # of Lesions Treated: 5  LENTIGINES, SEBORRHEIC KERATOSES, HEMANGIOMAS - Benign normal skin lesions - Benign-appearing - Call for any changes  MELANOCYTIC NEVI - Tan-brown and/or pink-flesh-colored symmetric macules and papules - Benign appearing on exam today - Observation - Call clinic for new or changing moles - Recommend daily use of broad spectrum spf 30+ sunscreen to sun-exposed areas.   ACTINIC DAMAGE WITH PRECANCEROUS ACTINIC KERATOSES Counseling for Topical Chemotherapy Management: Patient exhibits: - Severe, confluent actinic changes with pre-cancerous actinic keratoses that is secondary to cumulative UV radiation exposure over time - Condition that is severe; chronic, not at goal. - diffuse scaly erythematous  macules and papules with underlying dyspigmentation - Discussed Prescription "Field Treatment" topical Chemotherapy for Severe, Chronic Confluent Actinic  Changes with Pre-Cancerous Actinic Keratoses Field treatment involves treatment of an entire area of skin that has confluent Actinic Changes (Sun/ Ultraviolet light damage) and PreCancerous Actinic Keratoses by method of PhotoDynamic Therapy (PDT) and/or prescription Topical Chemotherapy agents such as 5-fluorouracil, 5-fluorouracil/calcipotriene, and/or imiquimod.  The purpose is to decrease the number of clinically evident and subclinical PreCancerous lesions to prevent progression to development of skin cancer by chemically destroying early precancer changes that may or may not be visible.  It has been shown to reduce the risk of developing skin cancer in the treated area. As a result of treatment, redness, scaling, crusting, and open sores may occur during treatment course. One or more than one of these methods may be used and may have to be used several times to control, suppress and eliminate the PreCancerous changes. Discussed treatment course, expected reaction, and possible side effects. - Recommend daily broad spectrum sunscreen SPF 30+ to sun-exposed areas, reapply every 2 hours as needed.  - Staying in the shade or wearing long sleeves, sun glasses (UVA+UVB protection) and wide brim hats (4-inch brim around the entire circumference of the hat) are also recommended. - Call for new or changing lesions.  In fall, start 5-fluorouracil cream followed by calcipotriene cream twice a day for 7 days to affected areas at the body including arms, hands, legs, feet, chest. Reviewed course of treatment and expected reaction.  Patient advised to expect inflammation and crusting and advised that erosions are possible.  Patient advised to be diligent with sun protection during and after treatment. Handout with details of how to apply medication and what to expect provided. Counseled to keep medication out of reach of children and pets.  Rx sent to [facility] in [place]. Patient advised to call the pharmacy  to fill the prescription, and the pharmacy will mail it to them for free.   Reviewed course of treatment and expected reaction.  Patient advised to expect inflammation and crusting and advised that erosions are possible.  Patient advised to be diligent with sun protection during and after treatment. Counseled to keep medication out of reach of children and pets.  Could consider TCA peel to hands and arm in future or 5FU wraps to legs if not clearing with 5FU/calcipotriene  HISTORY OF BASAL CELL CARCINOMA OF THE SKIN Left bra line infrascapular 06/2012 - No evidence of recurrence today - Recommend regular full body skin exams - Recommend daily broad spectrum sunscreen SPF 30+ to sun-exposed areas, reapply every 2 hours as needed.  - Call if any new or changing lesions are noted between office visits  HISTORY OF SQUAMOUS CELL CARCINOMA OF THE SKIN Multiple areas see history - No evidence of recurrence today - No lymphadenopathy - Recommend regular full body skin exams - Recommend daily broad spectrum sunscreen SPF 30+ to sun-exposed areas, reapply every 2 hours as needed.  - Call if any new or changing lesions are noted between office visits  HISTORY OF SQUAMOUS CELL CARCINOMA IN SITU OF THE SKIN Left abdomen [facility] 04/26/2022 - No evidence of recurrence today - Recommend regular full body skin exams - Recommend daily broad spectrum sunscreen SPF 30+ to sun-exposed areas, reapply every 2 hours as needed.  - Call if any new or changing lesions are noted between office visits  HISTORY OF DYSPLASTIC NEVUS 09/2009 left post waistline mild  No evidence of recurrence today Recommend  regular full body skin exams Recommend daily broad spectrum sunscreen SPF 30+ to sun-exposed areas, reapply every 2 hours as needed.  Call if any new or changing lesions are noted between office visits  SKIN CANCER SCREENING PERFORMED TODAY.   Return in about 6 weeks (around 12/07/2022) for ak recheck at mid  chest and other locations, 6 month tbse .  I, Asher Muir, CMA, am acting as scribe for Darden Dates, MD.   Documentation: I have reviewed the above documentation for accuracy and completeness, and I agree with the above.  Darden Dates, MD

## 2022-11-20 ENCOUNTER — Ambulatory Visit: Payer: Self-pay | Admitting: Surgery

## 2022-11-20 NOTE — Progress Notes (Signed)
Sent message, via epic in basket, requesting orders in epic from surgeon.  

## 2022-11-23 NOTE — Progress Notes (Addendum)
Anesthesia Review:  PCP: Tally Joe  Cardiologist : none  Chest x-ray : EKG :11/27/22  Echo : Stress test: Cardiac Cath :  Activity level: can do a flight of stairs without difficulty  Sleep Study/ CPAP : has cpap  Fasting Blood Sugar :      / Checks Blood Sugar -- times a day:   Blood Thinner/ Instructions /Last Dose: ASA / Instructions/ Last Dose :

## 2022-11-24 NOTE — Patient Instructions (Signed)
SURGICAL WAITING ROOM VISITATION  Patients having surgery or a procedure may have no more than 2 support people in the waiting area - these visitors may rotate.    Children under the age of 38 must have an adult with them who is not the patient.  Due to an increase in RSV and influenza rates and associated hospitalizations, children ages 49 and under may not visit patients in [facility] hospitals.  If the patient needs to stay at the hospital during part of their recovery, the visitor guidelines for inpatient rooms apply. Pre-op nurse will coordinate an appropriate time for 1 support person to accompany patient in pre-op.  This support person may not rotate.    Please refer to the [facility] website for the visitor guidelines for Inpatients (after your surgery is over and you are in a regular room).       Your procedure is scheduled on:  12/12/22    Report to [facility] Main Entrance    Report to admitting at   [phone]   Call this number if you have problems the morning of surgery [phone]   Do not eat food :After Midnight.   After Midnight you may have the following liquids until _ 0430_____ AM/  DAY OF SURGERY  Water Non-Citrus Juices (without pulp, NO RED-Apple, White grape, White cranberry) Black Coffee (NO MILK/CREAM OR CREAMERS, sugar ok)  Clear Tea (NO MILK/CREAM OR CREAMERS, sugar ok) regular and decaf                             Plain Jell-O (NO RED)                                           Fruit ices (not with fruit pulp, NO RED)                                     Popsicles (NO RED)                                                               Sports drinks like Gatorade (NO RED)           Fleets enema nite before surgery           If you have questions, please contact your surgeon's office.   FOLLOW BOWEL PREP AND ANY ADDITIONAL PRE OP INSTRUCTIONS YOU RECEIVED FROM YOUR SURGEON'S OFFICE!!!     Oral Hygiene is also important to reduce your risk  of infection.                                    Remember - BRUSH YOUR TEETH THE MORNING OF SURGERY WITH YOUR REGULAR TOOTHPASTE  DENTURES WILL BE REMOVED PRIOR TO SURGERY PLEASE DO NOT APPLY "Poly grip" OR ADHESIVES!!!   Do NOT smoke after Midnight   Take these medicines the morning of surgery with A SIP OF WATER:  none   DO NOT TAKE ANY  ORAL DIABETIC MEDICATIONS DAY OF YOUR SURGERY  Bring CPAP mask and tubing day of surgery.                              You may not have any metal on your body including hair pins, jewelry, and body piercing             Do not wear make-up, lotions, powders, perfumes/cologne, or deodorant  Do not wear nail polish including gel and S&S, artificial/acrylic nails, or any other type of covering on natural nails including finger and toenails. If you have artificial nails, gel coating, etc. that needs to be removed by a nail salon please have this removed prior to surgery or surgery may need to be canceled/ delayed if the surgeon/ anesthesia feels like they are unable to be safely monitored.   Do not shave  48 hours prior to surgery.               Men may shave face and neck.   Do not bring valuables to the hospital. [place] IS NOT             RESPONSIBLE   FOR VALUABLES.   Contacts, glasses, dentures or bridgework may not be worn into surgery.   Bring small overnight bag day of surgery.   DO NOT BRING YOUR HOME MEDICATIONS TO THE HOSPITAL. PHARMACY WILL DISPENSE MEDICATIONS LISTED ON YOUR MEDICATION LIST TO YOU DURING YOUR ADMISSION IN THE HOSPITAL!    Patients discharged on the day of surgery will not be allowed to drive home.  Someone NEEDS to stay with you for the first 24 hours after anesthesia.   Special Instructions: Bring a copy of your healthcare power of attorney and living will documents the day of surgery if you haven't scanned them before.              Please read over the following fact sheets you were given: IF YOU HAVE QUESTIONS  ABOUT YOUR PRE-OP INSTRUCTIONS PLEASE CALL [phone]   If you received a COVID test during your pre-op visit  it is requested that you wear a mask when out in public, stay away from anyone that may not be feeling well and notify your surgeon if you develop symptoms. If you test positive for Covid or have been in contact with anyone that has tested positive in the last 10 days please notify you surgeon.    [place] - Preparing for Surgery Before surgery, you can play an important role.  Because skin is not sterile, your skin needs to be as free of germs as possible.  You can reduce the number of germs on your skin by washing with CHG (chlorahexidine gluconate) soap before surgery.  CHG is an antiseptic cleaner which kills germs and bonds with the skin to continue killing germs even after washing. Please DO NOT use if you have an allergy to CHG or antibacterial soaps.  If your skin becomes reddened/irritated stop using the CHG and inform your nurse when you arrive at Short Stay. Do not shave (including legs and underarms) for at least 48 hours prior to the first CHG shower.  You may shave your face/neck. Please follow these instructions carefully:  1.  Shower with CHG Soap the night before surgery and the  morning of Surgery.  2.  If you choose to wash your hair, wash your hair first as usual with your  normal  shampoo.  3.  After you shampoo, rinse your hair and body thoroughly to remove the  shampoo.                           4.  Use CHG as you would any other liquid soap.  You can apply chg directly  to the skin and wash                       Gently with a scrungie or clean washcloth.  5.  Apply the CHG Soap to your body ONLY FROM THE NECK DOWN.   Do not use on face/ open                           Wound or open sores. Avoid contact with eyes, ears mouth and genitals (private parts).                       Wash face,  Genitals (private parts) with your normal soap.             6.  Wash  thoroughly, paying special attention to the area where your surgery  will be performed.  7.  Thoroughly rinse your body with warm water from the neck down.  8.  DO NOT shower/wash with your normal soap after using and rinsing off  the CHG Soap.                9.  Pat yourself dry with a clean towel.            10.  Wear clean pajamas.            11.  Place clean sheets on your bed the night of your first shower and do not  sleep with pets. Day of Surgery : Do not apply any lotions/deodorants the morning of surgery.  Please wear clean clothes to the hospital/surgery center.  FAILURE TO FOLLOW THESE INSTRUCTIONS MAY RESULT IN THE CANCELLATION OF YOUR SURGERY PATIENT SIGNATURE_________________________________  NURSE SIGNATURE__________________________________  ________________________________________________________________________

## 2022-11-27 ENCOUNTER — Encounter (HOSPITAL_COMMUNITY)
Admission: RE | Admit: 2022-11-27 | Discharge: 2022-11-27 | Disposition: A | Discharge status: Home / Self Care | Payer: Medicare HMO | Source: Ambulatory Visit | Attending: Surgery | Admitting: Surgery

## 2022-11-27 ENCOUNTER — Encounter (HOSPITAL_COMMUNITY): Payer: Self-pay

## 2022-11-27 ENCOUNTER — Other Ambulatory Visit: Payer: Self-pay

## 2022-11-27 VITALS — BP 136/92 | HR 66 | Temp 98.2°F | Resp 16 | Ht 63.5 in | Wt 141.0 lb

## 2022-11-27 DIAGNOSIS — Z01818 Encounter for other preprocedural examination: Secondary | ICD-10-CM

## 2022-11-27 HISTORY — DX: Sleep apnea, unspecified: G47.30

## 2022-11-27 HISTORY — DX: Unspecified osteoarthritis, unspecified site: M19.90

## 2022-11-27 LAB — BASIC METABOLIC PANEL
Anion gap: 9 (ref 5–15)
BUN: 15 mg/dL (ref 8–23)
CO2: 24 mmol/L (ref 22–32)
Calcium: 9.2 mg/dL (ref 8.9–10.3)
Chloride: 103 mmol/L (ref 98–111)
Creatinine, Ser: 0.73 mg/dL (ref 0.44–1.00)
GFR, Estimated: >60 mL/min (ref >60)
Glucose, Bld: 93 mg/dL (ref 70–99)
Potassium: 3.5 mmol/L (ref 3.5–5.1)
Sodium: 136 mmol/L (ref 135–145)

## 2022-11-27 LAB — CBC
HCT: 43.2 % (ref 36.0–46.0)
Hemoglobin: 14.3 g/dL (ref 12.0–15.0)
MCH: 30.2 pg (ref 26.0–34.0)
MCHC: 33.1 g/dL (ref 30.0–36.0)
MCV: 91.3 fL (ref 80.0–100.0)
Platelets: 254 10*3/uL (ref 150–400)
RBC: 4.73 MIL/uL (ref 3.87–5.11)
RDW: 12.6 % (ref 11.5–15.5)
WBC: 5.8 10*3/uL (ref 4.0–10.5)
nRBC: 0 % (ref 0.0–0.2)

## 2022-12-07 ENCOUNTER — Ambulatory Visit: Payer: Self-pay | Admitting: Surgery

## 2022-12-10 NOTE — Anesthesia Preprocedure Evaluation (Signed)
Anesthesia Evaluation  Patient identified by MRN, date of birth, ID band Patient awake    Reviewed: Allergy & Precautions, NPO status , Patient's Chart, lab work & pertinent test results  History of Anesthesia Complications Negative for: history of anesthetic complications  Airway Mallampati: III  TM Distance: >3 FB Neck ROM: Full   Comment: Previous grade I view with Miller 2, mask with [facility]  (+) Dental Advisory Given   Pulmonary neg shortness of breath, sleep apnea and Continuous Positive Airway Pressure Ventilation , neg COPD, neg recent URI, former smoker   Pulmonary exam normal breath sounds clear to auscultation       Cardiovascular hypertension (triamterene-HCTZ), Pt. on medications (-) angina (-) Past MI, (-) Cardiac Stents and (-) CABG (-) dysrhythmias  Rhythm:Regular Rate:Normal     Neuro/Psych neg Seizures    GI/Hepatic Neg liver ROS, hiatal hernia (s/p NIssen fundoplication), PUD,GERD  ,,diverticulosis   Endo/Other  negative endocrine ROS    Renal/GU negative Renal ROS     Musculoskeletal  (+) Arthritis ,    Abdominal   Peds  Hematology negative hematology ROS (+)   Anesthesia Other Findings   Reproductive/Obstetrics                             Anesthesia Physical Anesthesia Plan  ASA: 2  Anesthesia Plan: General   Post-op Pain Management: Tylenol PO (pre-op)*   Induction: Intravenous  PONV Risk Score and Plan: 3 and Ondansetron, Dexamethasone and Treatment may vary due to age or medical condition  Airway Management Planned: LMA  Additional Equipment:   Intra-op Plan:   Post-operative Plan: Extubation in OR  Informed Consent: I have reviewed the patients History and Physical, chart, labs and discussed the procedure including the risks, benefits and alternatives for the proposed anesthesia with the patient or authorized representative who has indicated  his/her understanding and acceptance.     Dental advisory given  Plan Discussed with: CRNA and Anesthesiologist  Anesthesia Plan Comments: (Risks of general anesthesia discussed including, but not limited to, sore throat, hoarse voice, chipped/damaged teeth, injury to vocal cords, nausea and vomiting, allergic reactions, lung infection, heart attack, stroke, and death. All questions answered. )       Anesthesia Quick Evaluation

## 2022-12-11 NOTE — H&P (Signed)
Chief Complaint:  hemorrhoids  History of Present Illness:  [name] is an 67 y.o. female with hemmorrhoids that are bothering her with hygiene and discomfort.  These are mainly on the left side.    Past Medical History:  Diagnosis Date   Actinic keratosis    Arthritis    Diverticulosis    Dysplastic nevus 09/27/2009   Left post waistline. Mild atypia. Limited margins free.   History of gallstones    History of hiatal hernia    Hx of basal cell carcinoma 06/18/2012   L bra line infrascapular   Hx of squamous cell carcinoma of skin    multiple sites   Hypertension    Peptic ulcer disease 1991   Sleep apnea    cpap   Squamous cell carcinoma in situ (SCCIS) 04/26/2022   left abdomen ED&C 07/03/22   Squamous cell carcinoma of skin 05/07/2007   Right medial superior breast. SCCis   Squamous cell carcinoma of skin 06/09/2013   Right medial calf. WD SCC. Recurrent 07/28/2013. Tx: EDC   Squamous cell carcinoma of skin 01/13/2014   Left medial pretibial. SCC, KA pattern   Squamous cell carcinoma of skin 09/06/2016   Left medial lower leg. WD SCC   Squamous cell carcinoma of skin 11/07/2017   Right distal pretibial. WD SCC. Tx: EDC   Squamous cell carcinoma of skin 07/03/2022   Left pretibia. EDC    Past Surgical History:  Procedure Laterality Date   CHOLECYSTECTOMY  991   ESOPHAGEAL MANOMETRY N/A 03/10/2016   Procedure: ESOPHAGEAL MANOMETRY (EM);  Surgeon: Napoleon Form, MD;  Location: [facility] ENDOSCOPY;  Service: Endoscopy;  Laterality: N/A;   HEMORRHOID SURGERY N/A 05/24/2016   Procedure: HEMORRHOIDECTOMY;  Surgeon: Luretha Murphy, MD;  Location: [facility] ORS;  Service: General;  Laterality: N/A;   left eye surgery      for macular hole    PARTIAL COLECTOMY  2010   TONSILLECTOMY  1966    No current facility-administered medications for this encounter.   Current Outpatient Medications  Medication Sig Dispense Refill   calcium carbonate (OSCAL) 1500 (600 Ca) MG TABS tablet  Take 600 mg of elemental calcium by mouth daily.     GLUCOSAMINE HCL PO Take 1 tablet by mouth daily.     Niacin (VITAMIN B-3 PO) Take 1 tablet by mouth daily.     NON FORMULARY Pt uses a cpap nightly     POTASSIUM PO Take 1 tablet by mouth daily.     triamterene-hydrochlorothiazide (MAXZIDE) 75-50 MG tablet Take 1 tablet by mouth daily.      clobetasol cream (TEMOVATE) 0.05 % Apply twice daily up to 2 weeks to affected area on left thigh and left ankle. Avoid applying to face, groin, and axilla. (Patient not taking: Reported on 11/23/2022) 30 g 1   mupirocin ointment (BACTROBAN) 2 % Apply 1 Application topically daily. (Patient not taking: Reported on 11/23/2022) 22 g 0   triamcinolone cream (KENALOG) 0.1 % " Cool Down Ointment" If needed after stopping treatment with Fluorouracil, apply twice a day for 3 days for face and up to 7 days for body areas to calm down inflammation (Patient not taking: Reported on 11/23/2022) 80 g 0   triamcinolone ointment (KENALOG) 0.1 % " Cool Down Ointment" If needed after stopping treatment with Fluorouracil, apply twice a day for 3 days for face and up to 7 days for body areas to calm down inflammation (Patient not taking: Reported on 11/23/2022) 80 g  0   Vicodin [hydrocodone-acetaminophen] Family History  Problem Relation Age of Onset   Diabetes Brother    Colon cancer Neg Hx    Colon polyps Neg Hx    Rectal cancer Neg Hx    Stomach cancer Neg Hx    Social History:   reports that she quit smoking about 48 years ago. Her smoking use included cigarettes. She has never used smokeless tobacco. She reports current alcohol use. She reports that she does not use drugs.   REVIEW OF SYSTEMS : Negative except for see problem list  Physical Exam:   There were no vitals taken for this visit. There is no height or weight on file to calculate BMI.  Gen:  WDWN WF NAD  Neurological: Alert and oriented to person, place, and time. Motor and sensory function is grossly intact   Head: Normocephalic and atraumatic.  Eyes: Conjunctivae are normal. Pupils are equal, round, and reactive to light. No scleral icterus.  Neck: Normal range of motion. Neck supple. No tracheal deviation or thyromegaly present.  Cardiovascular:  SR without murmurs or gallops.  No carotid bruits Breast:  not examined Respiratory: Effort normal.  No respiratory distress. No chest wall tenderness. Breath sounds normal.  No wheezes, rales or rhonchi.  Abdomen:  nontender GU:  protruding hemorrhoids Musculoskeletal: Normal range of motion. Extremities are nontender. No cyanosis, edema or clubbing noted Lymphadenopathy: No cervical, preauricular, postauricular or axillary adenopathy is present Skin: Skin is warm and dry. No rash noted. No diaphoresis. No erythema. No pallor. Pscyh: Normal mood and affect. Behavior is normal. Judgment and thought content normal.   LABORATORY RESULTS: No results found for this or any previous visit (from the past 48 hour(s)).   RADIOLOGY RESULTS: No results found.  Problem List: Patient Active Problem List   Diagnosis Date Noted   Status post Nissen fundoplication Nov 2017 05/25/2016   Hiatal hernia    Esophageal reflux 05/21/2012   DIVERTICULOSIS, COLON 01/28/2009    Assessment & Plan: hemorrhoids    Matt B. Daphine Deutscher, MD, [facility] [phone] beeper [phone]  12/11/2022 10:15 PM

## 2022-12-12 ENCOUNTER — Encounter (HOSPITAL_COMMUNITY): Payer: Self-pay | Admitting: Surgery

## 2022-12-12 ENCOUNTER — Ambulatory Visit (HOSPITAL_COMMUNITY)
Admission: RE | Admit: 2022-12-12 | Discharge: 2022-12-12 | Disposition: A | Discharge status: Home / Self Care | Payer: Medicare HMO | Attending: Surgery | Admitting: Surgery

## 2022-12-12 ENCOUNTER — Encounter (HOSPITAL_COMMUNITY)
Admission: RE | Disposition: A | Discharge status: Home / Self Care | Payer: Self-pay | Source: Home / Self Care | Attending: Surgery

## 2022-12-12 ENCOUNTER — Ambulatory Visit (HOSPITAL_COMMUNITY): Payer: Medicare HMO | Admitting: Physician Assistant

## 2022-12-12 ENCOUNTER — Ambulatory Visit (HOSPITAL_BASED_OUTPATIENT_CLINIC_OR_DEPARTMENT_OTHER): Payer: Medicare HMO | Admitting: Physician Assistant

## 2022-12-12 ENCOUNTER — Other Ambulatory Visit: Payer: Self-pay

## 2022-12-12 DIAGNOSIS — K620 Anal polyp: Secondary | ICD-10-CM | POA: Insufficient documentation

## 2022-12-12 DIAGNOSIS — K648 Other hemorrhoids: Secondary | ICD-10-CM | POA: Diagnosis not present

## 2022-12-12 DIAGNOSIS — Z9989 Dependence on other enabling machines and devices: Secondary | ICD-10-CM | POA: Diagnosis not present

## 2022-12-12 DIAGNOSIS — G4730 Sleep apnea, unspecified: Secondary | ICD-10-CM | POA: Insufficient documentation

## 2022-12-12 DIAGNOSIS — Z87891 Personal history of nicotine dependence: Secondary | ICD-10-CM | POA: Insufficient documentation

## 2022-12-12 DIAGNOSIS — I10 Essential (primary) hypertension: Secondary | ICD-10-CM

## 2022-12-12 DIAGNOSIS — K649 Unspecified hemorrhoids: Secondary | ICD-10-CM | POA: Diagnosis not present

## 2022-12-12 DIAGNOSIS — Z8711 Personal history of peptic ulcer disease: Secondary | ICD-10-CM | POA: Diagnosis not present

## 2022-12-12 DIAGNOSIS — Z01818 Encounter for other preprocedural examination: Secondary | ICD-10-CM

## 2022-12-12 DIAGNOSIS — G4733 Obstructive sleep apnea (adult) (pediatric): Secondary | ICD-10-CM

## 2022-12-12 DIAGNOSIS — Z9889 Other specified postprocedural states: Secondary | ICD-10-CM | POA: Insufficient documentation

## 2022-12-12 DIAGNOSIS — K644 Residual hemorrhoidal skin tags: Secondary | ICD-10-CM | POA: Diagnosis not present

## 2022-12-12 HISTORY — PX: HEMORRHOID SURGERY: SHX153

## 2022-12-12 SURGERY — HEMORRHOIDECTOMY
Anesthesia: General

## 2022-12-12 MED ORDER — AMISULPRIDE (ANTIEMETIC) 5 MG/2ML IV SOLN: 10.0000 mg | Freq: Once | INTRAVENOUS | Status: DC | PRN

## 2022-12-12 MED ORDER — OXYCODONE HCL 5 MG PO TABS: 5.0000 mg | ORAL_TABLET | Freq: Once | ORAL | Status: DC | PRN

## 2022-12-12 MED ORDER — LIDOCAINE 2% (20 MG/ML) 5 ML SYRINGE
INTRAMUSCULAR | Status: DC | PRN
  Administered 2022-12-12: 80 mg via INTRAVENOUS

## 2022-12-12 MED ORDER — 0.9 % SODIUM CHLORIDE (POUR BTL) OPTIME
TOPICAL | Status: DC | PRN
  Administered 2022-12-12: 1000 mL

## 2022-12-12 MED ORDER — ORAL CARE MOUTH RINSE: 15.0000 mL | Freq: Once | OROMUCOSAL | Status: AC

## 2022-12-12 MED ORDER — SODIUM CHLORIDE 0.9 % IV SOLN
2.0000 g | INTRAVENOUS | Status: AC
  Administered 2022-12-12: 2 g via INTRAVENOUS
  Filled 2022-12-12: qty 2

## 2022-12-12 MED ORDER — FLEET ENEMA 7-19 GM/118ML RE ENEM: 1.0000 | ENEMA | Freq: Once | RECTAL | Status: DC

## 2022-12-12 MED ORDER — MIDAZOLAM HCL 2 MG/2ML IJ SOLN
INTRAMUSCULAR | Status: AC
  Filled 2022-12-12: qty 2

## 2022-12-12 MED ORDER — FENTANYL CITRATE (PF) 100 MCG/2ML IJ SOLN
INTRAMUSCULAR | Status: AC
  Filled 2022-12-12: qty 2

## 2022-12-12 MED ORDER — EPHEDRINE 5 MG/ML INJ
INTRAVENOUS | Status: AC
  Filled 2022-12-12: qty 5

## 2022-12-12 MED ORDER — OXYCODONE HCL 5 MG PO TABS
5.0000 mg | ORAL_TABLET | Freq: Four times a day (QID) | ORAL | 0 refills | Status: DC | PRN
Start: 2022-12-12 — End: 2023-08-02

## 2022-12-12 MED ORDER — BUPIVACAINE LIPOSOME 1.3 % IJ SUSP
INTRAMUSCULAR | Status: DC | PRN
  Administered 2022-12-12: 20 mL

## 2022-12-12 MED ORDER — ACETAMINOPHEN 500 MG PO TABS
1000.0000 mg | ORAL_TABLET | ORAL | Status: AC
  Administered 2022-12-12: 1000 mg via ORAL
  Filled 2022-12-12: qty 2

## 2022-12-12 MED ORDER — DEXAMETHASONE SODIUM PHOSPHATE 10 MG/ML IJ SOLN
INTRAMUSCULAR | Status: DC | PRN
  Administered 2022-12-12: 8 mg via INTRAVENOUS

## 2022-12-12 MED ORDER — SCOPOLAMINE 1 MG/3DAYS TD PT72
1.0000 | MEDICATED_PATCH | TRANSDERMAL | Status: DC
  Administered 2022-12-12: 1.5 mg via TRANSDERMAL
  Filled 2022-12-12: qty 1

## 2022-12-12 MED ORDER — OXYCODONE HCL 5 MG/5ML PO SOLN: 5.0000 mg | Freq: Once | ORAL | Status: DC | PRN

## 2022-12-12 MED ORDER — EPHEDRINE SULFATE-NACL 50-0.9 MG/10ML-% IV SOSY
PREFILLED_SYRINGE | INTRAVENOUS | Status: DC | PRN
  Administered 2022-12-12: 5 mg via INTRAVENOUS

## 2022-12-12 MED ORDER — CHLORHEXIDINE GLUCONATE CLOTH 2 % EX PADS: 6.0000 | MEDICATED_PAD | Freq: Once | CUTANEOUS | Status: DC

## 2022-12-12 MED ORDER — BUPIVACAINE LIPOSOME 1.3 % IJ SUSP: 20.0000 mL | Freq: Once | INTRAMUSCULAR | Status: DC

## 2022-12-12 MED ORDER — ONDANSETRON HCL 4 MG/2ML IJ SOLN
INTRAMUSCULAR | Status: DC | PRN
  Administered 2022-12-12: 4 mg via INTRAVENOUS

## 2022-12-12 MED ORDER — DEXAMETHASONE SODIUM PHOSPHATE 10 MG/ML IJ SOLN
INTRAMUSCULAR | Status: AC
  Filled 2022-12-12: qty 1

## 2022-12-12 MED ORDER — FENTANYL CITRATE PF 50 MCG/ML IJ SOSY: 25.0000 ug | PREFILLED_SYRINGE | INTRAMUSCULAR | Status: DC | PRN

## 2022-12-12 MED ORDER — MIDAZOLAM HCL 5 MG/5ML IJ SOLN
INTRAMUSCULAR | Status: DC | PRN
  Administered 2022-12-12: 2 mg via INTRAVENOUS

## 2022-12-12 MED ORDER — BUPIVACAINE LIPOSOME 1.3 % IJ SUSP
INTRAMUSCULAR | Status: AC
  Filled 2022-12-12: qty 20

## 2022-12-12 MED ORDER — CHLORHEXIDINE GLUCONATE 0.12 % MT SOLN
15.0000 mL | Freq: Once | OROMUCOSAL | Status: AC
  Administered 2022-12-12: 15 mL via OROMUCOSAL

## 2022-12-12 MED ORDER — POVIDONE-IODINE 10 % EX OINT
TOPICAL_OINTMENT | CUTANEOUS | Status: DC | PRN
  Administered 2022-12-12: 1 via TOPICAL

## 2022-12-12 MED ORDER — FENTANYL CITRATE (PF) 100 MCG/2ML IJ SOLN
INTRAMUSCULAR | Status: DC | PRN
  Administered 2022-12-12 (×2): 25 ug via INTRAVENOUS

## 2022-12-12 MED ORDER — PROPOFOL 10 MG/ML IV BOLUS
INTRAVENOUS | Status: DC | PRN
  Administered 2022-12-12: 150 mg via INTRAVENOUS

## 2022-12-12 MED ORDER — LIDOCAINE HCL (PF) 2 % IJ SOLN
INTRAMUSCULAR | Status: AC
  Filled 2022-12-12: qty 5

## 2022-12-12 MED ORDER — PROPOFOL 10 MG/ML IV BOLUS
INTRAVENOUS | Status: AC
  Filled 2022-12-12: qty 20

## 2022-12-12 MED ORDER — POVIDONE-IODINE 10 % EX OINT
TOPICAL_OINTMENT | CUTANEOUS | Status: AC
  Filled 2022-12-12: qty 28.35

## 2022-12-12 MED ORDER — ONDANSETRON HCL 4 MG/2ML IJ SOLN
INTRAMUSCULAR | Status: AC
  Filled 2022-12-12: qty 2

## 2022-12-12 MED ORDER — LACTATED RINGERS IV SOLN: INTRAVENOUS | Status: DC

## 2022-12-12 SURGICAL SUPPLY — 29 items
BAG COUNTER SPONGE SURGICOUNT (BAG) IMPLANT
BAG SPNG CNTER NS LX DISP (BAG)
BLADE HEX COATED 2.75 (ELECTRODE) ×1 IMPLANT
BLADE SURG 15 STRL LF DISP TIS (BLADE) ×1 IMPLANT
BLADE SURG 15 STRL SS (BLADE) ×1
BRIEF MESH DISP LRG (UNDERPADS AND DIAPERS) ×1 IMPLANT
COVER SURGICAL LIGHT HANDLE (MISCELLANEOUS) ×1 IMPLANT
ELECT REM PT RETURN 15FT ADLT (MISCELLANEOUS) ×1 IMPLANT
GAUZE 4X4 16PLY ~~LOC~~+RFID DBL (SPONGE) ×1 IMPLANT
GAUZE PAD ABD 8X10 STRL (GAUZE/BANDAGES/DRESSINGS) IMPLANT
GAUZE SPONGE 4X4 12PLY STRL (GAUZE/BANDAGES/DRESSINGS) ×1 IMPLANT
GLOVE SURG LX STRL 8.0 MICRO (GLOVE) ×1 IMPLANT
GOWN SPEC L4 XLG W/TWL (GOWN DISPOSABLE) ×1 IMPLANT
GOWN STRL REUS W/ TWL XL LVL3 (GOWN DISPOSABLE) ×3 IMPLANT
GOWN STRL REUS W/TWL XL LVL3 (GOWN DISPOSABLE) ×3
KIT BASIN OR (CUSTOM PROCEDURE TRAY) ×1 IMPLANT
KIT TURNOVER KIT A (KITS) IMPLANT
NDL HYPO 22X1.5 SAFETY MO (MISCELLANEOUS) ×1 IMPLANT
NEEDLE HYPO 22X1.5 SAFETY MO (MISCELLANEOUS) ×1 IMPLANT
PACK LITHOTOMY IV (CUSTOM PROCEDURE TRAY) ×1 IMPLANT
PENCIL SMOKE EVACUATOR (MISCELLANEOUS) IMPLANT
SHEARS HARMONIC 9CM CVD (BLADE) IMPLANT
SPIKE FLUID TRANSFER (MISCELLANEOUS) ×1 IMPLANT
SPONGE HEMORRHOID 8X3CM (HEMOSTASIS) IMPLANT
SURGILUBE 2OZ TUBE FLIPTOP (MISCELLANEOUS) ×1 IMPLANT
SUT CHROMIC 2 0 SH (SUTURE) IMPLANT
SUT CHROMIC 3 0 SH 27 (SUTURE) IMPLANT
SYR 20ML LL LF (SYRINGE) ×1 IMPLANT
UNDERPAD 30X36 HEAVY ABSORB (UNDERPADS AND DIAPERS) ×1 IMPLANT

## 2022-12-12 NOTE — Transfer of Care (Signed)
Immediate Anesthesia Transfer of Care Note  Patient: [name]  Procedure(s) Performed: HEMORRHOIDECTOMY  Patient Location: PACU  Anesthesia Type:General  Level of Consciousness: awake, alert , oriented, and patient cooperative  Airway & Oxygen Therapy: Patient Spontanous Breathing and Patient connected to face mask oxygen  Post-op Assessment: Report given to RN, Post -op Vital signs reviewed and stable, and Patient moving all extremities  Post vital signs: Reviewed and stable  Last Vitals:  Vitals Value Taken Time  BP 117/80 12/12/22 0831  Temp    Pulse 58 12/12/22 0832  Resp 13 12/12/22 0832  SpO2 100 % 12/12/22 0832  Vitals shown include unvalidated device data.  Last Pain:  Vitals:   12/12/22 0627  TempSrc:   PainSc: 0-No pain         Complications: No notable events documented.

## 2022-12-12 NOTE — Anesthesia Postprocedure Evaluation (Signed)
Anesthesia Post Note  Patient: [name]  Procedure(s) Performed: HEMORRHOIDECTOMY     Patient location during evaluation: PACU Anesthesia Type: General Level of consciousness: awake Pain management: pain level controlled Vital Signs Assessment: post-procedure vital signs reviewed and stable Respiratory status: spontaneous breathing, nonlabored ventilation and respiratory function stable Cardiovascular status: blood pressure returned to baseline and stable Postop Assessment: no apparent nausea or vomiting Anesthetic complications: no   No notable events documented.  Last Vitals:  Vitals:   12/12/22 0900 12/12/22 0915  BP: 122/89 139/80  Pulse: (!) 52 (!) 54  Resp: 13 15  Temp: 36.5 C 36.6 C  SpO2: 98% 100%    Last Pain:  Vitals:   12/12/22 0915  TempSrc:   PainSc: 0-No pain                 Linton Rump

## 2022-12-12 NOTE — Op Note (Signed)
[name]  [birth year]   12/12/2022    PCP:  Deatra James, MD   Surgeon: Wenda Low, MD, FACS  Asst:  none  Anes:  general  Preop Dx: Recurrent  hemorrhoids Postop Dx: same  Procedure: EUA,  Location Surgery: [facility] Complications: none  EBL:   minimal cc  Drains: none  Description of Procedure:  The patient was taken to OR 1 .  After anesthesia was administered and the patient was prepped  with betadine  and a timeout was performed.  Patient was in stirrups and I did an exam under anesthesia.  She had previous hemorrhoidectomy mainly on the right side.  Mainly on the left side was involved.  On exam she had some areas of squamous change at the 5 o'clock position which I excised separately and sent those as a separate specimen.  I then concentrated on the internal hemorrhoids and excised those with the LigaSure device and he will.  The anterior 12 o'clock position had a skin tag which I excised separately and closed as did an area around 5 to 6:00.  The hemorrhoids were seen in mass except for the 5:00 specimen which was sent sent separately.  Prior to beginning procedure I used a 20 cc Exparel block all the way around the anus.  Betadine ointment was massaged into the anal canal and gauze and mesh panties were placed in the patient.  The patient tolerated the procedure well and was taken to the PACU in stable condition.     Matt B. Daphine Deutscher, MD, [facility], [place] [phone]

## 2022-12-12 NOTE — Anesthesia Procedure Notes (Signed)
Procedure Name: LMA Insertion Date/Time: 12/12/2022 7:40 AM  Performed by: Elisabeth Cara, CRNAPre-anesthesia Checklist: Patient identified, Emergency Drugs available, Suction available, Patient being monitored and Timeout performed Patient Re-evaluated:Patient Re-evaluated prior to induction Oxygen Delivery Method: Circle system utilized Preoxygenation: Pre-oxygenation with 100% oxygen Induction Type: IV induction LMA: LMA with gastric port inserted Number of attempts: 1 Placement Confirmation: positive ETCO2 and breath sounds checked- equal and bilateral Tube secured with: Tape Dental Injury: Teeth and Oropharynx as per pre-operative assessment

## 2022-12-12 NOTE — Interval H&P Note (Signed)
History and Physical Interval Note:  12/12/2022 7:29 AM  [name]  has presented today for surgery, with the diagnosis of PROLAPSED BLEEDING HEMORRHOIDS.  The various methods of treatment have been discussed with the patient and family. After consideration of risks, benefits and other options for treatment, the patient has consented to  Procedure(s): HEMORRHOIDECTOMY (N/A) as a surgical intervention.  The patient's history has been reviewed, patient examined, no change in status, stable for surgery.  I have reviewed the patient's chart and labs.  Questions were answered to the patient's satisfaction.     Valarie Merino

## 2022-12-13 ENCOUNTER — Encounter (HOSPITAL_COMMUNITY): Payer: Self-pay | Admitting: Surgery

## 2022-12-13 LAB — SURGICAL PATHOLOGY

## 2022-12-14 ENCOUNTER — Ambulatory Visit (INDEPENDENT_AMBULATORY_CARE_PROVIDER_SITE_OTHER): Payer: Medicare HMO | Admitting: Dermatology

## 2022-12-14 VITALS — BP 139/80

## 2022-12-14 DIAGNOSIS — Z872 Personal history of diseases of the skin and subcutaneous tissue: Secondary | ICD-10-CM | POA: Diagnosis not present

## 2022-12-14 DIAGNOSIS — L578 Other skin changes due to chronic exposure to nonionizing radiation: Secondary | ICD-10-CM | POA: Diagnosis not present

## 2022-12-14 DIAGNOSIS — W908XXA Exposure to other nonionizing radiation, initial encounter: Secondary | ICD-10-CM | POA: Diagnosis not present

## 2022-12-14 DIAGNOSIS — X32XXXA Exposure to sunlight, initial encounter: Secondary | ICD-10-CM | POA: Diagnosis not present

## 2022-12-14 DIAGNOSIS — L570 Actinic keratosis: Secondary | ICD-10-CM

## 2022-12-14 DIAGNOSIS — D2362 Other benign neoplasm of skin of left upper limb, including shoulder: Secondary | ICD-10-CM | POA: Diagnosis not present

## 2022-12-14 DIAGNOSIS — Z5111 Encounter for antineoplastic chemotherapy: Secondary | ICD-10-CM

## 2022-12-14 DIAGNOSIS — D492 Neoplasm of unspecified behavior of bone, soft tissue, and skin: Secondary | ICD-10-CM

## 2022-12-14 NOTE — Progress Notes (Signed)
Follow-Up Visit   Subjective  [name] is a 67 y.o. female who presents for the following: AK follow up at left posterior calf x 1, right upper arm x 1, right antecubital fossa x 1 , mid chest x 1, left medial calf x 1. Patient with a few spots to check at legs and one at finger.   The following portions of the chart were reviewed this encounter and updated as appropriate: medications, allergies, medical history  Review of Systems:  No other skin or systemic complaints except as noted in HPI or Assessment and Plan.  Objective  Well appearing patient in no apparent distress; mood and affect are within normal limits.   A focused examination was performed of the following areas: Legs, chest, hands  Relevant exam findings are noted in the Assessment and Plan.  L 4th finger x 1, L hand x 2, L forearm x 1, L ankle x 2, L pretibia x 2, L med calf x 1, L post calf x 3, R med calf x 1 (13) Erythematous thin papules/macules with gritty scale.   Left Hand 0.3 cm dark brown macule        Assessment & Plan   HISTORY OF PRECANCEROUS ACTINIC KERATOSIS - site(s) of PreCancerous Actinic Keratosis clear today. - these may recur and new lesions may form requiring treatment to prevent transformation into skin cancer - observe for new or changing spots and contact [facility] for appointment if occur - photoprotection with sun protective clothing; sunglasses and broad spectrum sunscreen with SPF of at least 30 + and frequent self skin exams recommended - yearly exams by a dermatologist recommended for persons with history of PreCancerous Actinic Keratoses   AK (actinic keratosis) (13) L 4th finger x 1, L hand x 2, L forearm x 1, L ankle x 2, L pretibia x 2, L med calf x 1, L post calf x 3, R med calf x 1  Actinic keratoses are precancerous spots that appear secondary to cumulative UV radiation exposure/sun exposure over time. They are chronic with expected duration over 1  year. A portion of actinic keratoses will progress to squamous cell carcinoma of the skin. It is not possible to reliably predict which spots will progress to skin cancer and so treatment is recommended to prevent development of skin cancer.  Recommend daily broad spectrum sunscreen SPF 30+ to sun-exposed areas, reapply every 2 hours as needed.  Recommend staying in the shade or wearing long sleeves, sun glasses (UVA+UVB protection) and wide brim hats (4-inch brim around the entire circumference of the hat). Call for new or changing lesions.  Prior to procedure, discussed risks of blister formation, small wound, skin dyspigmentation, or rare scar following cryotherapy. Recommend Vaseline ointment to treated areas while healing.  Hypertrophic at left 4th finger, consider bx if not resolved.  Hypertrophic at left ankle, left pretibia  Destruction of lesion - L 4th finger x 1, L hand x 2, L forearm x 1, L ankle x 2, L pretibia x 2, L med calf x 1, L post calf x 3, R med calf x 1  Destruction method: cryotherapy   Informed consent: discussed and consent obtained   Lesion destroyed using liquid nitrogen: Yes   Cryotherapy cycles:  2 Outcome: patient tolerated procedure well with no complications   Post-procedure details: wound care instructions given    Neoplasm of skin Left Hand  Epidermal / dermal shaving  Lesion diameter (cm):  0.3 Informed consent:  discussed and consent obtained   Timeout: patient name, date of birth, surgical site, and procedure verified   Anesthesia: the lesion was anesthetized in a standard fashion   Anesthetic:  1% lidocaine w/ epinephrine 1-100,000 local infiltration Instrument used: flexible razor blade   Hemostasis achieved with: aluminum chloride   Outcome: patient tolerated procedure well   Post-procedure details: wound care instructions given   Additional details:  Mupirocin and a bandage applied  Specimen 1 - Surgical pathology Differential Diagnosis:  Macular SK vs Lentigo  Check Margins: No 0.3 cm dark brown macule      Related Medications mupirocin ointment (BACTROBAN) 2 % Apply 1 Application topically daily.  ACTINIC DAMAGE WITH PRECANCEROUS ACTINIC KERATOSES Counseling for Topical Chemotherapy Management: Patient exhibits: - Severe, confluent actinic changes with pre-cancerous actinic keratoses that is secondary to cumulative UV radiation exposure over time - Condition that is severe; chronic, not at goal. - diffuse scaly erythematous macules and papules with underlying dyspigmentation - Discussed Prescription "Field Treatment" topical Chemotherapy for Severe, Chronic Confluent Actinic Changes with Pre-Cancerous Actinic Keratoses Field treatment involves treatment of an entire area of skin that has confluent Actinic Changes (Sun/ Ultraviolet light damage) and PreCancerous Actinic Keratoses by method of PhotoDynamic Therapy (PDT) and/or prescription Topical Chemotherapy agents such as 5-fluorouracil, 5-fluorouracil/calcipotriene, and/or imiquimod.  The purpose is to decrease the number of clinically evident and subclinical PreCancerous lesions to prevent progression to development of skin cancer by chemically destroying early precancer changes that may or may not be visible.  It has been shown to reduce the risk of developing skin cancer in the treated area. As a result of treatment, redness, scaling, crusting, and open sores may occur during treatment course. One or more than one of these methods may be used and may have to be used several times to control, suppress and eliminate the PreCancerous changes. Discussed treatment course, expected reaction, and possible side effects. - Recommend daily broad spectrum sunscreen SPF 30+ to sun-exposed areas, reapply every 2 hours as needed.  - Staying in the shade or wearing long sleeves, sun glasses (UVA+UVB protection) and wide brim hats (4-inch brim around the entire circumference of the hat)  are also recommended. - Call for new or changing lesions. - Consider chemotherapy wraps to legs > TCA peels in the fall  Apply 5-fluorouracil cream followed by calcipotriene cream twice a day for 7 days to affected areas at the body.  Reviewed course of treatment and expected reaction.  Patient advised to expect inflammation and crusting and advised that erosions are possible.  Patient advised to be diligent with sun protection during and after treatment. Handout with details of how to apply medication and what to expect provided. Counseled to keep medication out of reach of children and pets.   Return for TBSE, as scheduled, Hx AK, Hx BCC, Hx Dysplastic Nevi, Hx SCC, Hx SCCis.  Anise Salvo, RMA, am acting as scribe for Darden Dates, MD .   Documentation: I have reviewed the above documentation for accuracy and completeness, and I agree with the above.  Darden Dates, MD

## 2022-12-14 NOTE — Patient Instructions (Addendum)
Dr. Elie Goody   To treat legs, then forearms and hands in the fall or winter -   Apply 5-fluorouracil cream followed by calcipotriene cream twice a day for 7 days to affected areas at the body.  Reviewed course of treatment and expected reaction.  Patient advised to expect inflammation and crusting and advised that erosions are possible.  Patient advised to be diligent with sun protection during and after treatment. Handout with details of how to apply medication and what to expect provided. Counseled to keep medication out of reach of children and pets.  Wound Care Instructions  Cleanse wound gently with soap and water once a day then pat dry with clean gauze. Apply a thin coat of Petrolatum (petroleum jelly, "Vaseline") over the wound (unless you have an allergy to this). We recommend that you use a new, sterile tube of Vaseline. Do not pick or remove scabs. Do not remove the yellow or white "healing tissue" from the base of the wound.  Cover the wound with fresh, clean, nonstick gauze and secure with paper tape. You may use Band-Aids in place of gauze and tape if the wound is small enough, but would recommend trimming much of the tape off as there is often too much. Sometimes Band-Aids can irritate the skin.  You should call the office for your biopsy report after 1 week if you have not already been contacted.  If you experience any problems, such as abnormal amounts of bleeding, swelling, significant bruising, significant pain, or evidence of infection, please call the office immediately.  Recommend taking Heliocare sun protection supplement daily in sunny weather for additional sun protection. For maximum protection on the sunniest days, you can take up to 2 capsules of regular Heliocare OR take 1 capsule of Heliocare Ultra. For prolonged exposure (such as a full day in the sun), you can repeat your dose of the supplement 4 hours after your first dose. Heliocare can be purchased at  [employer], at some [facility] or at [email or web address].    Due to recent changes in healthcare laws, you may see results of your pathology and/or laboratory studies on MyChart before the doctors have had a chance to review them. We understand that in some cases there may be results that are confusing or concerning to you. Please understand that not all results are received at the same time and often the doctors may need to interpret multiple results in order to provide you with the best plan of care or course of treatment. Therefore, we ask that you please give [place] 2 business days to thoroughly review all your results before contacting the office for clarification. Should we see a critical lab result, you will be contacted sooner.   If You Need Anything After Your Visit  If you have any questions or concerns for your doctor, please call our main line at [phone] and press option 4 to reach your doctor's medical assistant. If no one answers, please leave a voicemail as directed and we will return your call as soon as possible. Messages left after 4 pm will be answered the following business day.   You may also send [place] a message via MyChart. We typically respond to MyChart messages within 1-2 business days.  For prescription refills, please ask your pharmacy to contact our office. Our fax number is [phone].  If you have an urgent issue when the clinic is closed that cannot wait until the next business day, you can page your doctor at  the number below.    Please note that while we do our best to be available for urgent issues outside of office hours, we are not available 24/7.   If you have an urgent issue and are unable to reach [place], you may choose to seek medical care at your doctor's office, retail clinic, urgent care center, or emergency room.  If you have a medical emergency, please immediately call 911 or go to the emergency department.  Pager Numbers  - Dr. Gwen Pounds:  [phone]  - Dr. Neale Burly: [phone]  - Dr. Roseanne Reno: [phone]  In the event of inclement weather, please call our main line at [phone] for an update on the status of any delays or closures.  Dermatology Medication Tips: Please keep the boxes that topical medications come in in order to help keep track of the instructions about where and how to use these. Pharmacies typically print the medication instructions only on the boxes and not directly on the medication tubes.   If your medication is too expensive, please contact our office at [phone] option 4 or send [place] a message through MyChart.   We are unable to tell what your co-pay for medications will be in advance as this is different depending on your insurance coverage. However, we may be able to find a substitute medication at lower cost or fill out paperwork to get insurance to cover a needed medication.   If a prior authorization is required to get your medication covered by your insurance company, please allow [place] 1-2 business days to complete this process.  Drug prices often vary depending on where the prescription is filled and some pharmacies may offer cheaper prices.  The website www.goodrx.com contains coupons for medications through different pharmacies. The prices here do not account for what the cost may be with help from insurance (it may be cheaper with your insurance), but the website can give you the price if you did not use any insurance.  - You can print the associated coupon and take it with your prescription to the pharmacy.  - You may also stop by our office during regular business hours and pick up a GoodRx coupon card.  - If you need your prescription sent electronically to a different pharmacy, notify our office through [facility] or by phone at [phone] option 4.

## 2022-12-21 ENCOUNTER — Telehealth: Payer: Self-pay

## 2022-12-21 NOTE — Telephone Encounter (Signed)
-----   Message from [place], MD sent at 12/21/2022 10:22 AM EDT ----- Skin , left hand LARGE CELL ACANTHOMA --> benign growth, possibly a type of "wisdom spot", no cancer, no treatment needed  MAs please call. Thank you!

## 2022-12-21 NOTE — Telephone Encounter (Signed)
Patient informed of pathology results 

## 2023-01-11 DIAGNOSIS — G4733 Obstructive sleep apnea (adult) (pediatric): Secondary | ICD-10-CM | POA: Diagnosis not present

## 2023-01-11 DIAGNOSIS — I10 Essential (primary) hypertension: Secondary | ICD-10-CM | POA: Diagnosis not present

## 2023-03-26 DIAGNOSIS — Z23 Encounter for immunization: Secondary | ICD-10-CM | POA: Diagnosis not present

## 2023-03-26 DIAGNOSIS — Z0000 Encounter for general adult medical examination without abnormal findings: Secondary | ICD-10-CM | POA: Diagnosis not present

## 2023-03-26 DIAGNOSIS — E7800 Pure hypercholesterolemia, unspecified: Secondary | ICD-10-CM | POA: Diagnosis not present

## 2023-03-26 DIAGNOSIS — I10 Essential (primary) hypertension: Secondary | ICD-10-CM | POA: Diagnosis not present

## 2023-03-26 DIAGNOSIS — Z1331 Encounter for screening for depression: Secondary | ICD-10-CM | POA: Diagnosis not present

## 2023-03-26 DIAGNOSIS — K580 Irritable bowel syndrome with diarrhea: Secondary | ICD-10-CM | POA: Diagnosis not present

## 2023-03-26 DIAGNOSIS — M8588 Other specified disorders of bone density and structure, other site: Secondary | ICD-10-CM | POA: Diagnosis not present

## 2023-03-26 DIAGNOSIS — G4733 Obstructive sleep apnea (adult) (pediatric): Secondary | ICD-10-CM | POA: Diagnosis not present

## 2023-03-26 DIAGNOSIS — K52832 Lymphocytic colitis: Secondary | ICD-10-CM | POA: Diagnosis not present

## 2023-03-26 DIAGNOSIS — Z87898 Personal history of other specified conditions: Secondary | ICD-10-CM | POA: Diagnosis not present

## 2023-03-26 DIAGNOSIS — R0781 Pleurodynia: Secondary | ICD-10-CM | POA: Diagnosis not present

## 2023-03-26 DIAGNOSIS — K219 Gastro-esophageal reflux disease without esophagitis: Secondary | ICD-10-CM | POA: Diagnosis not present

## 2023-03-28 DIAGNOSIS — G4733 Obstructive sleep apnea (adult) (pediatric): Secondary | ICD-10-CM | POA: Diagnosis not present

## 2023-04-04 DIAGNOSIS — G4733 Obstructive sleep apnea (adult) (pediatric): Secondary | ICD-10-CM | POA: Diagnosis not present

## 2023-04-27 DIAGNOSIS — G4733 Obstructive sleep apnea (adult) (pediatric): Secondary | ICD-10-CM | POA: Diagnosis not present

## 2023-04-28 DIAGNOSIS — R69 Illness, unspecified: Secondary | ICD-10-CM | POA: Diagnosis not present

## 2023-05-17 DIAGNOSIS — R69 Illness, unspecified: Secondary | ICD-10-CM | POA: Diagnosis not present

## 2023-05-22 ENCOUNTER — Emergency Department (HOSPITAL_COMMUNITY): Payer: Medicare HMO

## 2023-05-22 ENCOUNTER — Other Ambulatory Visit: Payer: Self-pay

## 2023-05-22 ENCOUNTER — Emergency Department (HOSPITAL_COMMUNITY)
Admission: EM | Admit: 2023-05-22 | Discharge: 2023-05-22 | Disposition: A | Discharge status: Other Acute Inpatient Hospital | Payer: Medicare HMO | Attending: Emergency Medicine | Admitting: Emergency Medicine

## 2023-05-22 DIAGNOSIS — I7101 Dissection of ascending aorta: Secondary | ICD-10-CM | POA: Diagnosis not present

## 2023-05-22 DIAGNOSIS — I7121 Aneurysm of the ascending aorta, without rupture: Secondary | ICD-10-CM | POA: Diagnosis not present

## 2023-05-22 DIAGNOSIS — R0789 Other chest pain: Secondary | ICD-10-CM | POA: Diagnosis not present

## 2023-05-22 DIAGNOSIS — I700 Atherosclerosis of aorta: Secondary | ICD-10-CM | POA: Diagnosis not present

## 2023-05-22 DIAGNOSIS — I340 Nonrheumatic mitral (valve) insufficiency: Secondary | ICD-10-CM | POA: Diagnosis not present

## 2023-05-22 DIAGNOSIS — I7100 Dissection of unspecified site of aorta: Secondary | ICD-10-CM | POA: Insufficient documentation

## 2023-05-22 DIAGNOSIS — K8689 Other specified diseases of pancreas: Secondary | ICD-10-CM | POA: Diagnosis not present

## 2023-05-22 DIAGNOSIS — I10 Essential (primary) hypertension: Secondary | ICD-10-CM | POA: Diagnosis not present

## 2023-05-22 DIAGNOSIS — R079 Chest pain, unspecified: Secondary | ICD-10-CM | POA: Diagnosis not present

## 2023-05-22 DIAGNOSIS — R109 Unspecified abdominal pain: Secondary | ICD-10-CM | POA: Diagnosis not present

## 2023-05-22 DIAGNOSIS — I71019 Dissection of thoracic aorta, unspecified: Secondary | ICD-10-CM | POA: Diagnosis not present

## 2023-05-22 DIAGNOSIS — K5730 Diverticulosis of large intestine without perforation or abscess without bleeding: Secondary | ICD-10-CM | POA: Diagnosis not present

## 2023-05-22 LAB — I-STAT CHEM 8, ED
BUN: 18 mg/dL (ref 8–23)
Calcium, Ion: 1.2 mmol/L (ref 1.15–1.40)
Chloride: 99 mmol/L (ref 98–111)
Creatinine, Ser: 1.1 mg/dL — ABNORMAL HIGH (ref 0.44–1.00)
Glucose, Bld: 152 mg/dL — ABNORMAL HIGH (ref 70–99)
HCT: 40 % (ref 36.0–46.0)
Hemoglobin: 13.6 g/dL (ref 12.0–15.0)
Potassium: 2.8 mmol/L — ABNORMAL LOW (ref 3.5–5.1)
Sodium: 140 mmol/L (ref 135–145)
TCO2: 27 mmol/L (ref 22–32)

## 2023-05-22 LAB — CBC
HCT: 40.4 % (ref 36.0–46.0)
Hemoglobin: 13.4 g/dL (ref 12.0–15.0)
MCH: 30.8 pg (ref 26.0–34.0)
MCHC: 33.2 g/dL (ref 30.0–36.0)
MCV: 92.9 fL (ref 80.0–100.0)
Platelets: 228 10*3/uL (ref 150–400)
RBC: 4.35 MIL/uL (ref 3.87–5.11)
RDW: 12.5 % (ref 11.5–15.5)
WBC: 6.7 10*3/uL (ref 4.0–10.5)
nRBC: 0 % (ref 0.0–0.2)

## 2023-05-22 LAB — COMPREHENSIVE METABOLIC PANEL
ALT: 26 U/L (ref 0–44)
AST: 27 U/L (ref 15–41)
Albumin: 4 g/dL (ref 3.5–5.0)
Alkaline Phosphatase: 89 U/L (ref 38–126)
Anion gap: 7 (ref 5–15)
BUN: 18 mg/dL (ref 8–23)
CO2: 27 mmol/L (ref 22–32)
Calcium: 8.8 mg/dL — ABNORMAL LOW (ref 8.9–10.3)
Chloride: 102 mmol/L (ref 98–111)
Creatinine, Ser: 1.05 mg/dL — ABNORMAL HIGH (ref 0.44–1.00)
GFR, Estimated: 58 mL/min — ABNORMAL LOW (ref >60)
Glucose, Bld: 158 mg/dL — ABNORMAL HIGH (ref 70–99)
Potassium: 2.7 mmol/L — CL (ref 3.5–5.1)
Sodium: 136 mmol/L (ref 135–145)
Total Bilirubin: 0.9 mg/dL (ref <1.2)
Total Protein: 7.5 g/dL (ref 6.5–8.1)

## 2023-05-22 LAB — MAGNESIUM: Magnesium: 2 mg/dL (ref 1.7–2.4)

## 2023-05-22 LAB — TROPONIN I (HIGH SENSITIVITY)
Troponin I (High Sensitivity): 5 ng/L (ref <18)
Troponin I (High Sensitivity): 4 ng/L (ref <18)

## 2023-05-22 LAB — LIPASE, BLOOD: Lipase: 25 U/L (ref 11–51)

## 2023-05-22 MED ORDER — ONDANSETRON HCL 4 MG/2ML IJ SOLN
4.0000 mg | Freq: Once | INTRAMUSCULAR | Status: AC
  Administered 2023-05-22: 4 mg via INTRAVENOUS
  Filled 2023-05-22: qty 2

## 2023-05-22 MED ORDER — SODIUM CHLORIDE (PF) 0.9 % IJ SOLN
INTRAMUSCULAR | Status: AC
  Filled 2023-05-22: qty 50

## 2023-05-22 MED ORDER — SODIUM CHLORIDE 0.9 % IV BOLUS
1000.0000 mL | Freq: Once | INTRAVENOUS | Status: AC
  Administered 2023-05-22: 1000 mL via INTRAVENOUS

## 2023-05-22 MED ORDER — POTASSIUM CHLORIDE 10 MEQ/100ML IV SOLN
10.0000 meq | Freq: Once | INTRAVENOUS | Status: DC
  Filled 2023-05-22: qty 100

## 2023-05-22 MED ORDER — PANTOPRAZOLE SODIUM 40 MG IV SOLR
40.0000 mg | Freq: Once | INTRAVENOUS | Status: DC
  Filled 2023-05-22: qty 10

## 2023-05-22 MED ORDER — METOCLOPRAMIDE HCL 5 MG/ML IJ SOLN
10.0000 mg | Freq: Once | INTRAMUSCULAR | Status: DC
  Filled 2023-05-22: qty 2

## 2023-05-22 MED ORDER — IOHEXOL 350 MG/ML SOLN
75.0000 mL | Freq: Once | INTRAVENOUS | Status: AC | PRN
  Administered 2023-05-22: 100 mL via INTRAVENOUS

## 2023-05-22 NOTE — ED Notes (Signed)
Report called to Dulce Sellar - charge nurse at [facility]. [facility] here and loading patient on stretcher. JRPRN

## 2023-05-22 NOTE — ED Provider Notes (Signed)
Patient seen in conjunction with midlevel staff.  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  Case discussed with [facility] surgery.  Dr. Leafy Ro is unable to take case given other concurrent emergent CT surgery case.  [facility] contacted regarding transfer.  Dr. Ty Hilts (CT Surgery) accepts in transfer.  ED to ED transfer initiated.  Patient and patient's husband at bedside understand plan of care.  They understand the potentially lethal nature of the patient's pathology. They understand need for emergent transport.  CRITICAL CARE Performed by: Wynetta Fines   Total critical care time: 45 minutes  Critical care time was exclusive of separately billable procedures and treating other patients.  Critical care was necessary to treat or prevent imminent or life-threatening deterioration.  Critical care was time spent personally by me on the following activities: development of treatment plan with patient and/or surrogate as well as nursing, discussions with consultants, evaluation of patient's response to treatment, examination of patient, obtaining history from patient or surrogate, ordering and performing treatments and interventions, ordering and review of laboratory studies, ordering and review of radiographic studies, pulse oximetry and re-evaluation of patient's condition.    Wynetta Fines, MD 05/22/23 [phone]

## 2023-05-22 NOTE — ED Provider Notes (Signed)
[place] EMERGENCY DEPARTMENT AT [facility] Provider Note   CSN: [id number] Arrival date & time: 05/22/23  1337     History  Chief Complaint  Patient presents with   Chest Pain   Eye Problem    [name] is a 67 y.o. female patient with past medical history of hypertension peptic ulcer disease diverticulitis presenting to emergency today with chest pain that started around 1:00 chest pain radiates around her back into her jaw feels like a burning chest pain.  Patient also had associated blurry vision in peripheral fields that lasted for approximately 45 minutes which is resolved at this time.  Patient said blurry vision was worse when she is standing or walking and resolved after a period of rest.  Patient considered calling EMS but felt improved, well enough to drive herself to emergency room.  Patient has not had any previous heart attack however has history of hypertension.  Patient reports she has had chronic diarrhea with worsening over the past 4 to 5 days. Has had improvement in chest symptoms and now has significant nausea. No focal weakness or change in sensation.    Chest Pain Eye Problem      Home Medications Prior to Admission medications   Medication Sig Start Date End Date Taking? Authorizing Provider  calcium carbonate (OSCAL) 1500 (600 Ca) MG TABS tablet Take 600 mg of elemental calcium by mouth daily.    [provider]  cholestyramine light (PREVALITE) 4 g packet Take 4 g by mouth 2 (two) times daily.    [provider]  clobetasol cream (TEMOVATE) 0.05 % Apply twice daily up to 2 weeks to affected area on left thigh and left ankle. Avoid applying to face, groin, and axilla. 08/03/21   Willeen Niece, MD  GLUCOSAMINE HCL PO Take 1 tablet by mouth daily.    [provider]  mupirocin ointment (BACTROBAN) 2 % Apply 1 Application topically daily. 07/03/22   Moye, [place], MD  Niacin (VITAMIN B-3 PO) Take 1 tablet by mouth  daily.    [provider]  NON FORMULARY Pt uses a cpap nightly    [provider]  oxyCODONE (OXY IR/ROXICODONE) 5 MG immediate release tablet Take 1 tablet (5 mg total) by mouth every 6 (six) hours as needed for severe pain. 12/12/22   Luretha Murphy, MD  POTASSIUM PO Take 1 tablet by mouth daily.    [provider]  triamcinolone cream (KENALOG) 0.1 % " Cool Down Ointment" If needed after stopping treatment with Fluorouracil, apply twice a day for 3 days for face and up to 7 days for body areas to calm down inflammation 07/18/22   Moye, [place], MD  triamcinolone ointment (KENALOG) 0.1 % " Cool Down Ointment" If needed after stopping treatment with Fluorouracil, apply twice a day for 3 days for face and up to 7 days for body areas to calm down inflammation 03/06/22   Moye, [place], MD  triamterene-hydrochlorothiazide (MAXZIDE) 75-50 MG tablet Take 1 tablet by mouth daily.     [provider]      Allergies    Vicodin [hydrocodone-acetaminophen]    Review of Systems   Review of Systems  Cardiovascular:  Positive for chest pain.    Physical Exam Updated Vital Signs BP (!) 78/54 (BP Location: Right Arm)   Pulse (!) 56   Temp 97.6 F (36.4 C) (Oral)   Resp 12   Ht 5\' 3"  (1.6 m)   Wt 71.7 kg  SpO2 100%   BMI 27.99 kg/m  Physical Exam Vitals and nursing note reviewed.  Constitutional:      General: She is not in acute distress.    Appearance: She is not ill-appearing, toxic-appearing or diaphoretic.  HENT:     Head: Normocephalic and atraumatic.  Eyes:     General: No scleral icterus.    Extraocular Movements: Extraocular movements intact.     Conjunctiva/sclera: Conjunctivae normal.     Pupils: Pupils are equal, round, and reactive to light.  Cardiovascular:     Rate and Rhythm: Regular rhythm. Bradycardia present.     Pulses: Normal pulses.     Heart sounds: Normal heart sounds. No murmur heard. Pulmonary:     Effort: Pulmonary effort  is normal. No respiratory distress.     Breath sounds: Normal breath sounds. No stridor. No wheezing.  Abdominal:     General: Abdomen is flat. Bowel sounds are normal. There is no distension.     Palpations: Abdomen is soft. There is no mass.     Tenderness: There is abdominal tenderness. There is no guarding.     Comments: Mild epigastric tenderness to palpation  Musculoskeletal:     Right lower leg: No edema.     Left lower leg: No edema.  Skin:    General: Skin is warm and dry.     Findings: No lesion.  Neurological:     General: No focal deficit present.     Mental Status: She is alert and oriented to person, place, and time. Mental status is at baseline.     Cranial Nerves: No cranial nerve deficit.     Sensory: No sensory deficit.     Motor: No weakness.     Coordination: Coordination normal.     Comments: Alert oriented, answering questions appropriately. No slurred speech.  No facial droop, sensation equal bilaterally.  No upper extremity weakness, sensation equal bilaterally, no ataxia No lower extremity weakness, compared bilaterally, patient ambulated in room, sensation equal bilaterally.      ED Results / Procedures / Treatments   Labs (all labs ordered are listed, but only abnormal results are displayed) Labs Reviewed  CBC  COMPREHENSIVE METABOLIC PANEL  LIPASE, BLOOD  URINALYSIS, ROUTINE W REFLEX MICROSCOPIC  COMPREHENSIVE METABOLIC PANEL  TROPONIN I (HIGH SENSITIVITY)    EKG EKG Interpretation Date/Time:  Tuesday May 22 2023 13:52:58 EST Ventricular Rate:  56 PR Interval:  204 QRS Duration:  101 QT Interval:  495 QTC Calculation: 478 R Axis:   23  Text Interpretation: Sinus rhythm Low voltage, precordial leads Borderline T abnormalities, anterior leads Confirmed by Kristine Royal [phone]) on 05/22/2023 2:08:05 PM  Radiology No results found.  Procedures Procedures    Medications Ordered in ED Medications  sodium chloride 0.9 % bolus 1,000  mL (has no administration in time range)    ED Course/ Medical Decision Making/ A&P                                 Medical Decision Making Amount and/or Complexity of Data Reviewed Labs: ordered. Radiology: ordered.  Risk Prescription drug management.   KATURAH KARAPETIAN 67 y.o. presented today for chest pain. Working DDx that I considered at this time includes, but not limited to, ACS, GERD, pe, pna, aortic dissection, pneumothorax, MSK path, anemia, esophageal rupture, CHF exacerbation, valvular disorder, myocarditis, pericarditis, endocarditis, pericardial effusion/cardiac tamponade, pulmonary edema, gastritis/PUD, esophagitis.  Review of prior external notes: None   Past medical history: hypertenstion   Imaging: CTA Aneurysmal ascending thoracic aorta with Type A dissection at root to distal arch/proximal descending thoracic aorta.  Celic artery 1.2cm fusiform aneurysmal dilation.  CT abd pelvis without acute findings    Unique Tests and My Interpretation:  EKG: Rate, rhythm, axis, intervals all examined: sinus with low voltage  Troponin: 5, repeat 4 CBC: no anemia, no leukocytosis  CMP: potassium 2.8, BUN and Cr unremarkable  Mg 2.0 Lipase 25  Problem List / ED Course / Critical interventions / Medication management  Patient reporting with chest pain radiating to back and jaw associated with diaphoresis and blurry vision, patient reports symptoms improved slightly and now has nausea. Patients nausea was treated, NS ordered for hypotension. Ordering CT angio and Ct abd pelvis for dissection rule out. Spoke with attending Dr Rodena Medin who agreed to see patient and assisted with care. Please see Dr Lavada Mesi note.  Ct imaging concerning for dissection, radiology called for interpretation. Dr Rodena Medin contacted CT surgery for transfer and surgery. Patient was accepted by [facility] and transferred.   Patients vitals assessed. Upon arrival patient is hypotensive and bradycardic      Plan:  Transferred to [facility] for CT surgery for dissection          Final Clinical Impression(s) / ED Diagnoses Final diagnoses:  Dissection of aorta, unspecified portion of aorta [facility])    Rx / DC Orders ED Discharge Orders     None         Reinaldo Raddle 05/22/23 2334    Wynetta Fines, MD 05/23/23 1452

## 2023-05-22 NOTE — ED Triage Notes (Signed)
Pt arrived via POV. C/o burning pain and pressure in L side of chest that radiated up to jaw at 1300. Pt states their vision became foggy, and they started sweating.  No previous cardiac hx

## 2023-05-23 DIAGNOSIS — J849 Interstitial pulmonary disease, unspecified: Secondary | ICD-10-CM | POA: Diagnosis not present

## 2023-05-23 DIAGNOSIS — I7101 Dissection of ascending aorta: Secondary | ICD-10-CM | POA: Diagnosis not present

## 2023-05-23 DIAGNOSIS — R140 Abdominal distension (gaseous): Secondary | ICD-10-CM | POA: Diagnosis not present

## 2023-05-23 DIAGNOSIS — K529 Noninfective gastroenteritis and colitis, unspecified: Secondary | ICD-10-CM | POA: Diagnosis not present

## 2023-05-23 DIAGNOSIS — L570 Actinic keratosis: Secondary | ICD-10-CM | POA: Diagnosis not present

## 2023-05-23 DIAGNOSIS — G4733 Obstructive sleep apnea (adult) (pediatric): Secondary | ICD-10-CM | POA: Diagnosis not present

## 2023-05-23 DIAGNOSIS — D689 Coagulation defect, unspecified: Secondary | ICD-10-CM | POA: Diagnosis not present

## 2023-05-23 DIAGNOSIS — I10 Essential (primary) hypertension: Secondary | ICD-10-CM | POA: Diagnosis not present

## 2023-05-23 DIAGNOSIS — Z8711 Personal history of peptic ulcer disease: Secondary | ICD-10-CM | POA: Diagnosis not present

## 2023-05-23 DIAGNOSIS — K6389 Other specified diseases of intestine: Secondary | ICD-10-CM | POA: Diagnosis not present

## 2023-05-23 DIAGNOSIS — K52832 Lymphocytic colitis: Secondary | ICD-10-CM | POA: Diagnosis not present

## 2023-05-23 DIAGNOSIS — Z452 Encounter for adjustment and management of vascular access device: Secondary | ICD-10-CM | POA: Diagnosis not present

## 2023-05-23 DIAGNOSIS — Z4682 Encounter for fitting and adjustment of non-vascular catheter: Secondary | ICD-10-CM | POA: Diagnosis not present

## 2023-05-23 DIAGNOSIS — Z48813 Encounter for surgical aftercare following surgery on the respiratory system: Secondary | ICD-10-CM | POA: Diagnosis not present

## 2023-05-23 DIAGNOSIS — E876 Hypokalemia: Secondary | ICD-10-CM | POA: Diagnosis not present

## 2023-05-23 DIAGNOSIS — Z9981 Dependence on supplemental oxygen: Secondary | ICD-10-CM | POA: Diagnosis not present

## 2023-05-23 DIAGNOSIS — I959 Hypotension, unspecified: Secondary | ICD-10-CM | POA: Diagnosis not present

## 2023-05-23 DIAGNOSIS — I3139 Other pericardial effusion (noninflammatory): Secondary | ICD-10-CM | POA: Diagnosis not present

## 2023-05-23 DIAGNOSIS — Z950 Presence of cardiac pacemaker: Secondary | ICD-10-CM | POA: Diagnosis not present

## 2023-05-23 DIAGNOSIS — K9189 Other postprocedural complications and disorders of digestive system: Secondary | ICD-10-CM | POA: Diagnosis not present

## 2023-05-23 DIAGNOSIS — E8770 Fluid overload, unspecified: Secondary | ICD-10-CM | POA: Diagnosis not present

## 2023-05-23 DIAGNOSIS — I7100 Dissection of unspecified site of aorta: Secondary | ICD-10-CM | POA: Insufficient documentation

## 2023-05-23 DIAGNOSIS — R7303 Prediabetes: Secondary | ICD-10-CM | POA: Diagnosis not present

## 2023-05-23 DIAGNOSIS — J9811 Atelectasis: Secondary | ICD-10-CM | POA: Diagnosis not present

## 2023-05-23 DIAGNOSIS — K449 Diaphragmatic hernia without obstruction or gangrene: Secondary | ICD-10-CM | POA: Diagnosis not present

## 2023-05-23 DIAGNOSIS — J90 Pleural effusion, not elsewhere classified: Secondary | ICD-10-CM | POA: Diagnosis not present

## 2023-05-23 DIAGNOSIS — R9431 Abnormal electrocardiogram [ECG] [EKG]: Secondary | ICD-10-CM | POA: Diagnosis not present

## 2023-05-23 DIAGNOSIS — R001 Bradycardia, unspecified: Secondary | ICD-10-CM | POA: Diagnosis not present

## 2023-05-23 DIAGNOSIS — Z79899 Other long term (current) drug therapy: Secondary | ICD-10-CM | POA: Diagnosis not present

## 2023-05-23 DIAGNOSIS — Z781 Physical restraint status: Secondary | ICD-10-CM | POA: Diagnosis not present

## 2023-05-23 DIAGNOSIS — K5730 Diverticulosis of large intestine without perforation or abscess without bleeding: Secondary | ICD-10-CM | POA: Diagnosis not present

## 2023-05-23 DIAGNOSIS — K219 Gastro-esophageal reflux disease without esophagitis: Secondary | ICD-10-CM | POA: Diagnosis not present

## 2023-05-23 DIAGNOSIS — K567 Ileus, unspecified: Secondary | ICD-10-CM | POA: Diagnosis not present

## 2023-05-23 DIAGNOSIS — I440 Atrioventricular block, first degree: Secondary | ICD-10-CM | POA: Diagnosis not present

## 2023-05-23 DIAGNOSIS — Z85828 Personal history of other malignant neoplasm of skin: Secondary | ICD-10-CM | POA: Diagnosis not present

## 2023-05-23 DIAGNOSIS — Z7982 Long term (current) use of aspirin: Secondary | ICD-10-CM | POA: Diagnosis not present

## 2023-05-29 ENCOUNTER — Ambulatory Visit: Payer: Medicare HMO | Admitting: Dermatology

## 2023-06-07 DIAGNOSIS — E871 Hypo-osmolality and hyponatremia: Secondary | ICD-10-CM | POA: Diagnosis not present

## 2023-06-07 DIAGNOSIS — D649 Anemia, unspecified: Secondary | ICD-10-CM | POA: Diagnosis not present

## 2023-06-11 DIAGNOSIS — R69 Illness, unspecified: Secondary | ICD-10-CM | POA: Diagnosis not present

## 2023-06-16 DIAGNOSIS — R69 Illness, unspecified: Secondary | ICD-10-CM | POA: Diagnosis not present

## 2023-06-17 DIAGNOSIS — D649 Anemia, unspecified: Secondary | ICD-10-CM | POA: Diagnosis not present

## 2023-06-17 DIAGNOSIS — K52832 Lymphocytic colitis: Secondary | ICD-10-CM | POA: Diagnosis not present

## 2023-06-17 DIAGNOSIS — K649 Unspecified hemorrhoids: Secondary | ICD-10-CM | POA: Diagnosis not present

## 2023-06-17 DIAGNOSIS — R69 Illness, unspecified: Secondary | ICD-10-CM | POA: Diagnosis not present

## 2023-06-17 DIAGNOSIS — Z87891 Personal history of nicotine dependence: Secondary | ICD-10-CM | POA: Diagnosis not present

## 2023-06-17 DIAGNOSIS — Z95828 Presence of other vascular implants and grafts: Secondary | ICD-10-CM | POA: Diagnosis not present

## 2023-06-17 DIAGNOSIS — K219 Gastro-esophageal reflux disease without esophagitis: Secondary | ICD-10-CM | POA: Diagnosis not present

## 2023-06-17 DIAGNOSIS — K589 Irritable bowel syndrome without diarrhea: Secondary | ICD-10-CM | POA: Diagnosis not present

## 2023-06-17 DIAGNOSIS — K449 Diaphragmatic hernia without obstruction or gangrene: Secondary | ICD-10-CM | POA: Diagnosis not present

## 2023-06-17 DIAGNOSIS — Z9049 Acquired absence of other specified parts of digestive tract: Secondary | ICD-10-CM | POA: Diagnosis not present

## 2023-06-17 DIAGNOSIS — I10 Essential (primary) hypertension: Secondary | ICD-10-CM | POA: Diagnosis not present

## 2023-06-17 DIAGNOSIS — E871 Hypo-osmolality and hyponatremia: Secondary | ICD-10-CM | POA: Diagnosis not present

## 2023-06-17 DIAGNOSIS — Z7982 Long term (current) use of aspirin: Secondary | ICD-10-CM | POA: Diagnosis not present

## 2023-06-17 DIAGNOSIS — Z952 Presence of prosthetic heart valve: Secondary | ICD-10-CM | POA: Diagnosis not present

## 2023-06-17 DIAGNOSIS — H9190 Unspecified hearing loss, unspecified ear: Secondary | ICD-10-CM | POA: Diagnosis not present

## 2023-06-17 DIAGNOSIS — Z85828 Personal history of other malignant neoplasm of skin: Secondary | ICD-10-CM | POA: Diagnosis not present

## 2023-06-17 DIAGNOSIS — Z48812 Encounter for surgical aftercare following surgery on the circulatory system: Secondary | ICD-10-CM | POA: Diagnosis not present

## 2023-06-17 DIAGNOSIS — Z8711 Personal history of peptic ulcer disease: Secondary | ICD-10-CM | POA: Diagnosis not present

## 2023-06-17 DIAGNOSIS — G4733 Obstructive sleep apnea (adult) (pediatric): Secondary | ICD-10-CM | POA: Diagnosis not present

## 2023-06-18 ENCOUNTER — Encounter: Payer: Self-pay | Admitting: *Deleted

## 2023-06-18 ENCOUNTER — Encounter: Payer: Medicare HMO | Attending: Family Medicine | Admitting: *Deleted

## 2023-06-18 DIAGNOSIS — R053 Chronic cough: Secondary | ICD-10-CM | POA: Diagnosis not present

## 2023-06-18 DIAGNOSIS — R531 Weakness: Secondary | ICD-10-CM | POA: Diagnosis not present

## 2023-06-18 DIAGNOSIS — D649 Anemia, unspecified: Secondary | ICD-10-CM | POA: Diagnosis not present

## 2023-06-18 DIAGNOSIS — Z9889 Other specified postprocedural states: Secondary | ICD-10-CM

## 2023-06-18 NOTE — Progress Notes (Signed)
Virtual orientation call completed today. shehas an appointment on Date: will schedule for Jan after [facility] completed  for EP eval and gym Orientation.  Documentation of diagnosis can be found in [facility] Date: 05/22/2023 .

## 2023-06-21 DIAGNOSIS — Z952 Presence of prosthetic heart valve: Secondary | ICD-10-CM | POA: Diagnosis not present

## 2023-06-25 ENCOUNTER — Ambulatory Visit: Payer: Medicare HMO

## 2023-06-27 DIAGNOSIS — K649 Unspecified hemorrhoids: Secondary | ICD-10-CM | POA: Diagnosis not present

## 2023-06-27 DIAGNOSIS — Z9049 Acquired absence of other specified parts of digestive tract: Secondary | ICD-10-CM | POA: Diagnosis not present

## 2023-06-27 DIAGNOSIS — Z85828 Personal history of other malignant neoplasm of skin: Secondary | ICD-10-CM | POA: Diagnosis not present

## 2023-06-27 DIAGNOSIS — Z87891 Personal history of nicotine dependence: Secondary | ICD-10-CM | POA: Diagnosis not present

## 2023-06-27 DIAGNOSIS — E871 Hypo-osmolality and hyponatremia: Secondary | ICD-10-CM | POA: Diagnosis not present

## 2023-06-27 DIAGNOSIS — K52832 Lymphocytic colitis: Secondary | ICD-10-CM | POA: Diagnosis not present

## 2023-06-27 DIAGNOSIS — D649 Anemia, unspecified: Secondary | ICD-10-CM | POA: Diagnosis not present

## 2023-06-27 DIAGNOSIS — K589 Irritable bowel syndrome without diarrhea: Secondary | ICD-10-CM | POA: Diagnosis not present

## 2023-06-27 DIAGNOSIS — Z952 Presence of prosthetic heart valve: Secondary | ICD-10-CM | POA: Diagnosis not present

## 2023-06-27 DIAGNOSIS — Z48812 Encounter for surgical aftercare following surgery on the circulatory system: Secondary | ICD-10-CM | POA: Diagnosis not present

## 2023-06-27 DIAGNOSIS — Z8711 Personal history of peptic ulcer disease: Secondary | ICD-10-CM | POA: Diagnosis not present

## 2023-06-27 DIAGNOSIS — K449 Diaphragmatic hernia without obstruction or gangrene: Secondary | ICD-10-CM | POA: Diagnosis not present

## 2023-06-27 DIAGNOSIS — H9190 Unspecified hearing loss, unspecified ear: Secondary | ICD-10-CM | POA: Diagnosis not present

## 2023-06-27 DIAGNOSIS — Z7982 Long term (current) use of aspirin: Secondary | ICD-10-CM | POA: Diagnosis not present

## 2023-06-27 DIAGNOSIS — I10 Essential (primary) hypertension: Secondary | ICD-10-CM | POA: Diagnosis not present

## 2023-06-27 DIAGNOSIS — G4733 Obstructive sleep apnea (adult) (pediatric): Secondary | ICD-10-CM | POA: Diagnosis not present

## 2023-06-27 DIAGNOSIS — Z95828 Presence of other vascular implants and grafts: Secondary | ICD-10-CM | POA: Diagnosis not present

## 2023-06-27 DIAGNOSIS — K219 Gastro-esophageal reflux disease without esophagitis: Secondary | ICD-10-CM | POA: Diagnosis not present

## 2023-06-28 ENCOUNTER — Encounter: Payer: Self-pay | Admitting: Dermatology

## 2023-06-28 ENCOUNTER — Ambulatory Visit (INDEPENDENT_AMBULATORY_CARE_PROVIDER_SITE_OTHER): Payer: Medicare HMO | Admitting: Dermatology

## 2023-06-28 DIAGNOSIS — E876 Hypokalemia: Secondary | ICD-10-CM | POA: Insufficient documentation

## 2023-06-28 DIAGNOSIS — Z86018 Personal history of other benign neoplasm: Secondary | ICD-10-CM

## 2023-06-28 DIAGNOSIS — Z1283 Encounter for screening for malignant neoplasm of skin: Secondary | ICD-10-CM

## 2023-06-28 DIAGNOSIS — K580 Irritable bowel syndrome with diarrhea: Secondary | ICD-10-CM | POA: Insufficient documentation

## 2023-06-28 DIAGNOSIS — L821 Other seborrheic keratosis: Secondary | ICD-10-CM

## 2023-06-28 DIAGNOSIS — L578 Other skin changes due to chronic exposure to nonionizing radiation: Secondary | ICD-10-CM

## 2023-06-28 DIAGNOSIS — Z872 Personal history of diseases of the skin and subcutaneous tissue: Secondary | ICD-10-CM | POA: Diagnosis not present

## 2023-06-28 DIAGNOSIS — L570 Actinic keratosis: Secondary | ICD-10-CM | POA: Diagnosis not present

## 2023-06-28 DIAGNOSIS — Z85828 Personal history of other malignant neoplasm of skin: Secondary | ICD-10-CM | POA: Diagnosis not present

## 2023-06-28 DIAGNOSIS — G4733 Obstructive sleep apnea (adult) (pediatric): Secondary | ICD-10-CM | POA: Insufficient documentation

## 2023-06-28 DIAGNOSIS — E7800 Pure hypercholesterolemia, unspecified: Secondary | ICD-10-CM | POA: Insufficient documentation

## 2023-06-28 DIAGNOSIS — L814 Other melanin hyperpigmentation: Secondary | ICD-10-CM | POA: Diagnosis not present

## 2023-06-28 DIAGNOSIS — H9193 Unspecified hearing loss, bilateral: Secondary | ICD-10-CM | POA: Insufficient documentation

## 2023-06-28 DIAGNOSIS — D1801 Hemangioma of skin and subcutaneous tissue: Secondary | ICD-10-CM

## 2023-06-28 DIAGNOSIS — W908XXA Exposure to other nonionizing radiation, initial encounter: Secondary | ICD-10-CM | POA: Diagnosis not present

## 2023-06-28 DIAGNOSIS — D229 Melanocytic nevi, unspecified: Secondary | ICD-10-CM

## 2023-06-28 DIAGNOSIS — Z86007 Personal history of in-situ neoplasm of skin: Secondary | ICD-10-CM | POA: Diagnosis not present

## 2023-06-28 DIAGNOSIS — M8588 Other specified disorders of bone density and structure, other site: Secondary | ICD-10-CM | POA: Insufficient documentation

## 2023-06-28 DIAGNOSIS — Z7189 Other specified counseling: Secondary | ICD-10-CM | POA: Diagnosis not present

## 2023-06-28 DIAGNOSIS — I10 Essential (primary) hypertension: Secondary | ICD-10-CM | POA: Insufficient documentation

## 2023-06-28 DIAGNOSIS — G8929 Other chronic pain: Secondary | ICD-10-CM | POA: Insufficient documentation

## 2023-06-28 DIAGNOSIS — J302 Other seasonal allergic rhinitis: Secondary | ICD-10-CM | POA: Insufficient documentation

## 2023-06-28 MED ORDER — FLUOROURACIL 5 % EX CREA: TOPICAL_CREAM | Freq: Two times a day (BID) | CUTANEOUS | 6 refills | Status: DC

## 2023-06-28 NOTE — Patient Instructions (Addendum)
Start 5-fluorouracil/calcipotriene cream twice a day until getting a reaction to affected areas including dorsum hands, then treat forearms (can do arms in thirds) then chest, then face (forehead, temples, nose, upper lip) back, then legs, then dorsal feet.  Treat one area at a time.  Prescription sent to [facility]. Patient advised they will receive an email to purchase the medication online and have it sent to their home. Patient provided with handout reviewing treatment course and side effects and advised to call or message [place] on MyChart with any concerns.   Instructions for [facility] Medications  One or more of your medications was sent to the [facility] mail order compounding pharmacy. You will receive an email from them and can purchase the medicine through that link. It will then be mailed to your home at the address you confirmed. If for any reason you do not receive an email from them, please check your spam folder. If you still do not find the email, please let us know. [facility] phone number is [phone].    Due to recent changes in healthcare laws, you may see results of your pathology and/or laboratory studies on MyChart before the doctors have had a chance to review them. We understand that in some cases there may be results that are confusing or concerning to you. Please understand that not all results are received at the same time and often the doctors may need to interpret multiple results in order to provide you with the best plan of care or course of treatment. Therefore, we ask that you please give [place] 2 business days to thoroughly review all your results before contacting the office for clarification. Should we see a critical lab result, you will be contacted sooner.   If You Need Anything After Your Visit  If you have any questions or concerns for your doctor, please call our main line at [phone] and press option 4 to reach your  doctor's medical assistant. If no one answers, please leave a voicemail as directed and we will return your call as soon as possible. Messages left after 4 pm will be answered the following business day.   You may also send [place] a message via MyChart. We typically respond to MyChart messages within 1-2 business days.  For prescription refills, please ask your pharmacy to contact our office. Our fax number is [phone].  If you have an urgent issue when the clinic is closed that cannot wait until the next business day, you can page your doctor at the number below.    Please note that while we do our best to be available for urgent issues outside of office hours, we are not available 24/7.   If you have an urgent issue and are unable to reach [place], you may choose to seek medical care at your doctor's office, retail clinic, urgent care center, or emergency room.  If you have a medical emergency, please immediately call 911 or go to the emergency department.  Pager Numbers  - Dr. Gwen Pounds: [phone]  - Dr. Roseanne Reno: [phone]  - Dr. Katrinka Blazing: [phone]   In the event of inclement weather, please call our main line at [phone] for an update on the status of any delays or closures.  Dermatology Medication Tips: Please keep the boxes that topical medications come in in order to help keep track of the instructions about where and how to use these. Pharmacies typically print the medication instructions only on the boxes and  not directly on the medication tubes.   If your medication is too expensive, please contact our office at [phone] option 4 or send [place] a message through MyChart.   We are unable to tell what your co-pay for medications will be in advance as this is different depending on your insurance coverage. However, we may be able to find a substitute medication at lower cost or fill out paperwork to get insurance to cover a needed medication.   If a prior authorization is  required to get your medication covered by your insurance company, please allow [place] 1-2 business days to complete this process.  Drug prices often vary depending on where the prescription is filled and some pharmacies may offer cheaper prices.  The website www.goodrx.com contains coupons for medications through different pharmacies. The prices here do not account for what the cost may be with help from insurance (it may be cheaper with your insurance), but the website can give you the price if you did not use any insurance.  - You can print the associated coupon and take it with your prescription to the pharmacy.  - You may also stop by our office during regular business hours and pick up a GoodRx coupon card.  - If you need your prescription sent electronically to a different pharmacy, notify our office through [facility] or by phone at [phone] option 4.     Si Usted Necesita Algo Despus de Su Visita  Tambin puede enviarnos un mensaje a travs de Clinical cytogeneticist. Por lo general respondemos a los mensajes de MyChart en el transcurso de 1 a 2 das hbiles.  Para renovar recetas, por favor pida a su farmacia que se ponga en contacto con nuestra oficina. Annie Sable de fax es [place] [phone].  Si tiene un asunto urgente cuando la clnica est cerrada y que no puede esperar hasta el siguiente da hbil, puede llamar/localizar a su doctor(a) al nmero que aparece a continuacin.   Por favor, tenga en cuenta que aunque hacemos todo lo posible para estar disponibles para asuntos urgentes fuera del horario de [place], no estamos disponibles las 24 horas del da, los 7 [address]  [address] de la [place].   Si tiene un problema urgente y no puede comunicarse con nosotros, puede optar por buscar atencin mdica  en el consultorio de su doctor(a), en una clnica privada, en un centro de atencin urgente o en una sala de emergencias.  Si tiene Engineer, drilling, por favor llame inmediatamente al 911 o vaya a  la sala de emergencias.  Nmeros de bper  - Dr. Gwen Pounds: [phone]  - Dra. Roseanne Reno: [phone]  - Dr. Katrinka Blazing: [phone]   En caso de inclemencias del tiempo, por favor llame a Lacy Duverney principal al [phone] para una actualizacin sobre el [place] de cualquier retraso o cierre.  Consejos para la medicacin en dermatologa: Por favor, guarde las cajas en las que vienen los medicamentos de uso tpico para ayudarle a seguir las instrucciones sobre dnde y cmo usarlos. Las farmacias generalmente imprimen las instrucciones del medicamento slo en las cajas y no directamente en los tubos del [place].   Si su medicamento es muy caro, por favor, pngase en contacto con Rolm Gala llamando al [phone] y presione la opcin 4 o envenos un mensaje a travs de Clinical cytogeneticist.   No podemos decirle cul ser su copago por los medicamentos por adelantado ya que esto es diferente dependiendo de la cobertura de su seguro. Sin embargo, es posible que podamos Clinical research associate  un medicamento sustituto a Audiological scientist un formulario para que el seguro cubra el medicamento que se considera necesario.   Si se requiere una autorizacin previa para que su compaa de seguros [place] su medicamento, por favor permtanos de 1 a 2 das hbiles para completar [address]  Los precios de los medicamentos varan con frecuencia dependiendo del Environmental consultant de dnde se surte la receta y alguna farmacias pueden ofrecer precios ms baratos.  El sitio web www.goodrx.com tiene cupones para medicamentos de Health and safety inspector. Los precios aqu no tienen en cuenta lo que podra costar con la ayuda del seguro (puede ser ms barato con su seguro), pero el sitio web puede darle el precio si no utiliz Tourist information centre manager.  - Puede imprimir el cupn correspondiente y llevarlo con su receta a la farmacia.  - Tambin puede pasar por nuestra oficina durante el horario de atencin regular y Education officer, museum una tarjeta de cupones de  GoodRx.  - Si necesita que su receta se enve electrnicamente a una farmacia diferente, informe a nuestra oficina a travs de MyChart de [place] o por telfono llamando al [phone] y presione la opcin 4.

## 2023-06-28 NOTE — Progress Notes (Signed)
Follow-Up Visit   Subjective  [name] is a 67 y.o. female who presents for the following: Skin Cancer Screening and Full Body Skin Exam, hx of BCC, SCCs, SCC IS, Dysplastic Nevus, Aks, recent open heart surgery pt has been off of the Niacinamide and feels like she has flared   The patient presents for Total-Body Skin Exam (TBSE) for skin cancer screening and mole check. The patient has spots, moles and lesions to be evaluated, some may be new or changing and the patient may have concern these could be cancer.    The following portions of the chart were reviewed this encounter and updated as appropriate: medications, allergies, medical history  Review of Systems:  No other skin or systemic complaints except as noted in HPI or Assessment and Plan.  Objective  Well appearing patient in no apparent distress; mood and affect are within normal limits.  A full examination was performed including scalp, head, eyes, ears, nose, lips, neck, chest, axillae, abdomen, back, buttocks, bilateral upper extremities, bilateral lower extremities, hands, feet, fingers, toes, fingernails, and toenails. All findings within normal limits unless otherwise noted below.   Relevant physical exam findings are noted in the Assessment and Plan.  face, trunk, extremities Innumerable pink scaly keratotic macules and papules on chest upper back, dorsal hands, forearms, lower legs, dorsal feet, left forehead, right temple, right cheek, left jaw line  Assessment & Plan   SKIN CANCER SCREENING PERFORMED TODAY.  ACTINIC DAMAGE WITH PRECANCEROUS ACTINIC KERATOSES Counseling for Topical Chemotherapy Management: Patient exhibits: - Severe, confluent actinic changes with pre-cancerous actinic keratoses that is secondary to cumulative UV radiation exposure over time - Condition that is severe; chronic, not at goal. - diffuse scaly erythematous macules and papules with underlying dyspigmentation - Discussed  Prescription "Field Treatment" topical Chemotherapy for Severe, Chronic Confluent Actinic Changes with Pre-Cancerous Actinic Keratoses Field treatment involves treatment of an entire area of skin that has confluent Actinic Changes (Sun/ Ultraviolet light damage) and PreCancerous Actinic Keratoses by method of PhotoDynamic Therapy (PDT) and/or prescription Topical Chemotherapy agents such as 5-fluorouracil, 5-fluorouracil/calcipotriene, and/or imiquimod.  The purpose is to decrease the number of clinically evident and subclinical PreCancerous lesions to prevent progression to development of skin cancer by chemically destroying early precancer changes that may or may not be visible.  It has been shown to reduce the risk of developing skin cancer in the treated area. As a result of treatment, redness, scaling, crusting, and open sores may occur during treatment course. One or more than one of these methods may be used and may have to be used several times to control, suppress and eliminate the PreCancerous changes. Discussed treatment course, expected reaction, and possible side effects. - Recommend daily broad spectrum sunscreen SPF 30+ to sun-exposed areas, reapply every 2 hours as needed.  - Staying in the shade or wearing long sleeves, sun glasses (UVA+UVB protection) and wide brim hats (4-inch brim around the entire circumference of the hat) are also recommended. - Call for new or changing lesions.  - Start 5-fluorouracil/calcipotriene cream twice a day until getting a reaction to affected areas including dorsum hands, then treat forearms (can do arms in thirds) then chest, then face (forehead, temples, nose, upper lip) back, then legs, then dorsal feet.  Treat one area at a time. Prescription sent to [facility]. Patient advised they will receive an email to purchase the medication online and have it sent to their home. Patient provided with handout reviewing treatment  course and  side effects and advised to call or message [place] on MyChart with any concerns.  Reviewed course of treatment and expected reaction.  Patient advised to expect inflammation and crusting and advised that erosions are possible.  Patient advised to be diligent with sun protection during and after treatment. Counseled to keep medication out of reach of children and pets.  LENTIGINES, SEBORRHEIC KERATOSES, HEMANGIOMAS - Benign normal skin lesions - Benign-appearing - Call for any changes  MELANOCYTIC NEVI - Tan-brown and/or pink-flesh-colored symmetric macules and papules - Benign appearing on exam today - Observation - Call clinic for new or changing moles - Recommend daily use of broad spectrum spf 30+ sunscreen to sun-exposed areas.   HISTORY OF BASAL CELL CARCINOMA OF THE SKIN - No evidence of recurrence today - Recommend regular full body skin exams - Recommend daily broad spectrum sunscreen SPF 30+ to sun-exposed areas, reapply every 2 hours as needed.  - Call if any new or changing lesions are noted between office visits  - L bra line infrascapular  HISTORY OF SQUAMOUS CELL CARCINOMA OF THE SKIN - No evidence of recurrence today - No lymphadenopathy - Recommend regular full body skin exams - Recommend daily broad spectrum sunscreen SPF 30+ to sun-exposed areas, reapply every 2 hours as needed.  - Call if any new or changing lesions are noted between office visits - R medial calf, L medial pretibial, L medial lower leg, R distal pretibial, L pretibia   HISTORY OF SQUAMOUS CELL CARCINOMA IN SITU OF THE SKIN - No evidence of recurrence today - Recommend regular full body skin exams - Recommend daily broad spectrum sunscreen SPF 30+ to sun-exposed areas, reapply every 2 hours as needed.  - Call if any new or changing lesions are noted between office visits  - R medial sup breast  HISTORY OF DYSPLASTIC NEVUS No evidence of recurrence today Recommend regular full body skin  exams Recommend daily broad spectrum sunscreen SPF 30+ to sun-exposed areas, reapply every 2 hours as needed.  Call if any new or changing lesions are noted between office visits  - L post waistline  MULTIPLE BENIGN NEVI   LENTIGINES   ACTINIC ELASTOSIS   SEBORRHEIC KERATOSES   CHERRY ANGIOMA   AK (ACTINIC KERATOSIS) face, trunk, extremities Multiple face, chest, back, arms, dorsal hands, legs, dorsal feet, pt will txt one area at a time with 5FU/Calcipotriene bid until red and irritated.  Actinic keratoses are precancerous spots that appear secondary to cumulative UV radiation exposure/sun exposure over time. They are chronic with expected duration over 1 year. A portion of actinic keratoses will progress to squamous cell carcinoma of the skin. It is not possible to reliably predict which spots will progress to skin cancer and so treatment is recommended to prevent development of skin cancer.  Recommend daily broad spectrum sunscreen SPF 30+ to sun-exposed areas, reapply every 2 hours as needed.  Recommend staying in the shade or wearing long sleeves, sun glasses (UVA+UVB protection) and wide brim hats (4-inch brim around the entire circumference of the hat). Call for new or changing lesions. Return in about 1 month (around 07/29/2023) for AK f/u.  I, Ardis Rowan, RMA, am acting as scribe for Elie Goody, MD .   Documentation: I have reviewed the above documentation for accuracy and completeness, and I agree with the above.  Elie Goody, MD

## 2023-07-02 ENCOUNTER — Telehealth: Payer: Self-pay

## 2023-07-02 MED ORDER — CALCIPOTRIENE 0.005 % EX CREA: TOPICAL_CREAM | CUTANEOUS | 0 refills | Status: DC

## 2023-07-02 MED ORDER — FLUOROURACIL 5 % EX CREA: TOPICAL_CREAM | Freq: Two times a day (BID) | CUTANEOUS | 0 refills | Status: DC

## 2023-07-02 NOTE — Telephone Encounter (Signed)
Patient called requesting Fluorouracil and Calcipotriene be sent in separately like Dr. Neale Burly had prescribed in the past. Pt states using the two separately instead of the compounded mix works better for her.  Medications sent to [facility] and advised if too expensive, or if she doesn't hear from them in 48 hours to let us know.

## 2023-07-04 DIAGNOSIS — Z48812 Encounter for surgical aftercare following surgery on the circulatory system: Secondary | ICD-10-CM | POA: Diagnosis not present

## 2023-07-04 DIAGNOSIS — E871 Hypo-osmolality and hyponatremia: Secondary | ICD-10-CM | POA: Diagnosis not present

## 2023-07-04 DIAGNOSIS — Z8711 Personal history of peptic ulcer disease: Secondary | ICD-10-CM | POA: Diagnosis not present

## 2023-07-04 DIAGNOSIS — D649 Anemia, unspecified: Secondary | ICD-10-CM | POA: Diagnosis not present

## 2023-07-04 DIAGNOSIS — I10 Essential (primary) hypertension: Secondary | ICD-10-CM | POA: Diagnosis not present

## 2023-07-04 DIAGNOSIS — Z9049 Acquired absence of other specified parts of digestive tract: Secondary | ICD-10-CM | POA: Diagnosis not present

## 2023-07-04 DIAGNOSIS — Z7982 Long term (current) use of aspirin: Secondary | ICD-10-CM | POA: Diagnosis not present

## 2023-07-04 DIAGNOSIS — K219 Gastro-esophageal reflux disease without esophagitis: Secondary | ICD-10-CM | POA: Diagnosis not present

## 2023-07-04 DIAGNOSIS — H9190 Unspecified hearing loss, unspecified ear: Secondary | ICD-10-CM | POA: Diagnosis not present

## 2023-07-04 DIAGNOSIS — Z87891 Personal history of nicotine dependence: Secondary | ICD-10-CM | POA: Diagnosis not present

## 2023-07-04 DIAGNOSIS — G4733 Obstructive sleep apnea (adult) (pediatric): Secondary | ICD-10-CM | POA: Diagnosis not present

## 2023-07-04 DIAGNOSIS — K649 Unspecified hemorrhoids: Secondary | ICD-10-CM | POA: Diagnosis not present

## 2023-07-04 DIAGNOSIS — Z95828 Presence of other vascular implants and grafts: Secondary | ICD-10-CM | POA: Diagnosis not present

## 2023-07-04 DIAGNOSIS — K589 Irritable bowel syndrome without diarrhea: Secondary | ICD-10-CM | POA: Diagnosis not present

## 2023-07-04 DIAGNOSIS — K449 Diaphragmatic hernia without obstruction or gangrene: Secondary | ICD-10-CM | POA: Diagnosis not present

## 2023-07-04 DIAGNOSIS — Z85828 Personal history of other malignant neoplasm of skin: Secondary | ICD-10-CM | POA: Diagnosis not present

## 2023-07-04 DIAGNOSIS — K52832 Lymphocytic colitis: Secondary | ICD-10-CM | POA: Diagnosis not present

## 2023-07-04 DIAGNOSIS — Z952 Presence of prosthetic heart valve: Secondary | ICD-10-CM | POA: Diagnosis not present

## 2023-07-17 DIAGNOSIS — Z9049 Acquired absence of other specified parts of digestive tract: Secondary | ICD-10-CM | POA: Diagnosis not present

## 2023-07-17 DIAGNOSIS — Z952 Presence of prosthetic heart valve: Secondary | ICD-10-CM | POA: Diagnosis not present

## 2023-07-17 DIAGNOSIS — Z87891 Personal history of nicotine dependence: Secondary | ICD-10-CM | POA: Diagnosis not present

## 2023-07-17 DIAGNOSIS — Z48812 Encounter for surgical aftercare following surgery on the circulatory system: Secondary | ICD-10-CM | POA: Diagnosis not present

## 2023-07-17 DIAGNOSIS — D649 Anemia, unspecified: Secondary | ICD-10-CM | POA: Diagnosis not present

## 2023-07-17 DIAGNOSIS — H9190 Unspecified hearing loss, unspecified ear: Secondary | ICD-10-CM | POA: Diagnosis not present

## 2023-07-17 DIAGNOSIS — K52832 Lymphocytic colitis: Secondary | ICD-10-CM | POA: Diagnosis not present

## 2023-07-17 DIAGNOSIS — I10 Essential (primary) hypertension: Secondary | ICD-10-CM | POA: Diagnosis not present

## 2023-07-17 DIAGNOSIS — K449 Diaphragmatic hernia without obstruction or gangrene: Secondary | ICD-10-CM | POA: Diagnosis not present

## 2023-07-17 DIAGNOSIS — E871 Hypo-osmolality and hyponatremia: Secondary | ICD-10-CM | POA: Diagnosis not present

## 2023-07-17 DIAGNOSIS — G4733 Obstructive sleep apnea (adult) (pediatric): Secondary | ICD-10-CM | POA: Diagnosis not present

## 2023-07-17 DIAGNOSIS — K589 Irritable bowel syndrome without diarrhea: Secondary | ICD-10-CM | POA: Diagnosis not present

## 2023-07-17 DIAGNOSIS — Z7982 Long term (current) use of aspirin: Secondary | ICD-10-CM | POA: Diagnosis not present

## 2023-07-17 DIAGNOSIS — K649 Unspecified hemorrhoids: Secondary | ICD-10-CM | POA: Diagnosis not present

## 2023-07-17 DIAGNOSIS — Z85828 Personal history of other malignant neoplasm of skin: Secondary | ICD-10-CM | POA: Diagnosis not present

## 2023-07-17 DIAGNOSIS — Z95828 Presence of other vascular implants and grafts: Secondary | ICD-10-CM | POA: Diagnosis not present

## 2023-07-17 DIAGNOSIS — K219 Gastro-esophageal reflux disease without esophagitis: Secondary | ICD-10-CM | POA: Diagnosis not present

## 2023-07-17 DIAGNOSIS — Z8711 Personal history of peptic ulcer disease: Secondary | ICD-10-CM | POA: Diagnosis not present

## 2023-07-18 ENCOUNTER — Telehealth: Payer: Self-pay | Admitting: Student

## 2023-07-18 NOTE — Telephone Encounter (Signed)
  Patient called [name] with concern about elevated BP after recent Type A aortic dissection. She has never been seen by our practice. She has a New Patient Visit scheduled with Dr. Santo later this month. I called and spoke with patient. I explained that unfortunately since we have never seen her, I legally cannot give her any recommendations regarding her medical care. She was very understanding of this. I recommended she call her PCP or the CT surgeon for recommendations until she can get established with our office. She voiced understanding and thanked me for calling.  Desere Gwin E Kaylee Trivett, PA-C 07/18/2023 10:30 AM

## 2023-07-19 ENCOUNTER — Encounter: Payer: Medicare HMO | Admitting: Dermatology

## 2023-07-26 ENCOUNTER — Other Ambulatory Visit: Payer: Self-pay | Admitting: Family Medicine

## 2023-07-26 DIAGNOSIS — Z1231 Encounter for screening mammogram for malignant neoplasm of breast: Secondary | ICD-10-CM

## 2023-07-30 ENCOUNTER — Ambulatory Visit: Payer: Medicare Other | Admitting: Dermatology

## 2023-07-30 DIAGNOSIS — L570 Actinic keratosis: Secondary | ICD-10-CM | POA: Diagnosis not present

## 2023-07-30 DIAGNOSIS — Z5111 Encounter for antineoplastic chemotherapy: Secondary | ICD-10-CM | POA: Diagnosis not present

## 2023-07-30 DIAGNOSIS — W908XXA Exposure to other nonionizing radiation, initial encounter: Secondary | ICD-10-CM | POA: Diagnosis not present

## 2023-07-30 DIAGNOSIS — L578 Other skin changes due to chronic exposure to nonionizing radiation: Secondary | ICD-10-CM | POA: Diagnosis not present

## 2023-07-30 DIAGNOSIS — L281 Prurigo nodularis: Secondary | ICD-10-CM

## 2023-07-30 MED ORDER — CLOBETASOL PROPIONATE 0.05 % EX CREA: TOPICAL_CREAM | CUTANEOUS | 1 refills | Status: DC

## 2023-07-30 NOTE — Progress Notes (Signed)
 Follow-Up Visit   Subjective  [name] is a 68 y.o. female who presents for the following: patient here today for ak follow up. Reports she did try calcipotriene  cream to right leg and right foot but did not notice much of a reaction. Also used cream at nose and edge of face and did see some reaction. Did not start [facility] 5 f/u cream yet.    The patient has spots, moles and lesions to be evaluated, some may be new or changing and the patient may have concern these could be cancer.   The following portions of the chart were reviewed this encounter and updated as appropriate: medications, allergies, medical history  Review of Systems:  No other skin or systemic complaints except as noted in HPI or Assessment and Plan.  Objective  Well appearing patient in no apparent distress; mood and affect are within normal limits.    A focused examination was performed of the following areas: Right leg, right foot , face   Relevant exam findings are noted in the Assessment and Plan.    Assessment & Plan   SKIN CANCER SCREENING PERFORMED TODAY.   ACTINIC DAMAGE WITH PRECANCEROUS ACTINIC KERATOSES Counseling for Topical Chemotherapy Management: Patient exhibits: Exam: Severe, confluent actinic changes with pre-cancerous actinic keratoses that is secondary to cumulative UV radiation exposure over time on face trunk extremities. 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 - diffuse scaly erythematous macules and papules with underlying dyspigmentation - Discussed Prescription Field Treatment topical Chemotherapy for Severe, Chronic Confluent Actinic Changes with Pre-Cancerous Actinic Keratoses Field treatment involves treatment of an entire area of skin that has confluent Actinic Changes (Sun/ Ultraviolet light damage) and PreCancerous Actinic Keratoses by method of PhotoDynamic Therapy (PDT)  and/or prescription Topical Chemotherapy agents such as 5-fluorouracil , 5-fluorouracil /calcipotriene , and/or imiquimod .  The purpose is to decrease the number of clinically evident and subclinical PreCancerous lesions to prevent progression to development of skin cancer by chemically destroying early precancer changes that may or may not be visible.  It has been shown to reduce the risk of developing skin cancer in the treated area. As a result of treatment, redness, scaling, crusting, and open sores may occur during treatment course. One or more than one of these methods may be used and may have to be used several times to control, suppress and eliminate the PreCancerous changes. Discussed treatment course, expected reaction, and possible side effects. - Recommend daily broad spectrum sunscreen SPF 30+ to sun-exposed areas, reapply every 2 hours as needed.  - Staying in the shade or wearing long sleeves, sun glasses (UVA+UVB protection) and wide brim hats (4-inch brim around the entire circumference of the hat) are also recommended. - Call for new or changing lesions.  - patient mixed 5FU calcipotriene  from the regular pharmacy. Jointly decided to try the compounded version. Patient advised to send mychart if she doesn't have a reaction in 12 days of BID use on legs. Will then send rx of imquimod cream to try on affected areas. Continue 5-fluorouracil /calcipotriene  cream twice a day until getting a reaction to affected areas including dorsum hands, then treat forearms (can do arms in thirds) then chest, then face (forehead, temples, nose, upper lip) back, then legs, then dorsal feet.  Treat one area at a time. Prescription sent to [facility]. Patient advised they will receive an email to purchase the medication online and have it sent  to their home. Patient provided with handout reviewing treatment course and side effects and advised to call or message us  on MyChart with any  concerns.     Reviewed course of treatment and expected reaction.  Patient advised to expect inflammation and crusting and advised that erosions are possible.  Patient advised to be diligent with sun protection during and after treatment. Counseled to keep medication out of reach of children and pets.    PRURIGO NODULARIS   Related Medications clobetasol  cream (TEMOVATE ) 0.05 % Apply twice daily up to 2 weeks to affected area on left thigh and left ankle. Avoid applying to face, groin, and axilla.  Return for 1 month ak follow up.  I, Eleanor Blush, CMA, am acting as scribe for Boneta Sharps, MD.   Documentation: I have reviewed the above documentation for accuracy and completeness, and I agree with the above.  Boneta Sharps, MD

## 2023-07-30 NOTE — Patient Instructions (Addendum)
 If you use cream and do not see results in 12 days send mychart message, we will send in the imiquimod  cream in    Continue    Start 5-fluorouracil /calcipotriene  cream twice a day until getting a reaction to affected areas including dorsum hands, then treat forearms (can do arms in thirds) then chest, then face (forehead, temples, nose, upper lip) back, then legs, then top of  feet.  Treat one area at a time. Prescription sent to [facility]. Patient advised they will receive an email to purchase the medication online and have it sent to their home. Patient provided with handout reviewing treatment course and side effects and advised to call or message us  on MyChart with any concerns.   Reviewed course of treatment and expected reaction.  Patient advised to expect inflammation and crusting and advised that erosions are possible.  Patient advised to be diligent with sun protection during and after treatment. Counseled to keep medication out of reach of children and pets.   Instructions for [facility] Medic[facility]  One or more of your medications was sent to the [facility] mail order [facility] pharmacy. You will receive an email from them and can purchase the medicine through that link. It will then be mailed to your home at the address you confirmed. If for any reason you do not receive an email from them, please check your spam folder. If you still do not find the email, please let us  know. [facility] phone number is [phone].     5-Fluorouracil /Calcipotriene  Patient Education   Actinic keratoses are the dry, red scaly spots on the skin caused by sun damage. A portion of these spots can turn into skin cancer with time, and treating them can help prevent development of skin cancer.   Treatment of these spots requires removal of the defective skin cells. There are various ways to remove actinic keratoses, including freezing with liquid nitrogen,  treatment with creams, or treatment with a blue light procedure in the office.   5-fluorouracil  cream is a topical cream used to treat actinic keratoses. It works by interfering with the growth of abnormal fast-growing skin cells, such as actinic keratoses. These cells peel off and are replaced by healthy ones.   5-fluorouracil /calcipotriene  is a combination of the 5-fluorouracil  cream with a vitamin D analog cream called calcipotriene . The calcipotriene  alone does not treat actinic keratoses. However, when it is combined with 5-fluorouracil , it helps the 5-fluorouracil  treat the actinic keratoses much faster so that the same results can be achieved with a much shorter treatment time.  INSTRUCTIONS FOR 5-FLUOROURACIL /CALCIPOTRIENE  CREAM:   5-fluorouracil /calcipotriene  cream typically only needs to be used for 4-7 days. A thin layer should be applied twice a day to the treatment areas recommended by your physician.   If your physician prescribed you separate tubes of 5-fluourouracil and calcipotriene , apply a thin layer of 5-fluorouracil  followed by a thin layer of calcipotriene .   Avoid contact with your eyes, nostrils, and mouth. Do not use 5-fluorouracil /calcipotriene  cream on infected or open wounds.   You will develop redness, irritation and some crusting at areas where you have pre-cancer damage/actinic keratoses. IF YOU DEVELOP PAIN, BLEEDING, OR SIGNIFICANT CRUSTING, STOP THE TREATMENT EARLY - you have already gotten a good response and the actinic keratoses should clear up well.  Wash your hands after applying 5-fluorouracil  5% cream on your skin.   A moisturizer or sunscreen with a minimum SPF 30 should be applied each morning.   Once  you have finished the treatment, you can apply a thin layer of Vaseline twice a day to irritated areas to soothe and calm the areas more quickly. If you experience significant discomfort, contact your physician.  For some patients it is necessary to  repeat the treatment for best results.  SIDE EFFECTS: When using 5-fluorouracil /calcipotriene  cream, you may have mild irritation, such as redness, dryness, swelling, or a mild burning sensation. This usually resolves within 2 weeks. The more actinic keratoses you have, the more redness and inflammation you can expect during treatment. Eye irritation has been reported rarely. If this occurs, please let us  know.  If you have any trouble using this cream, please call the office. If you have any other questions about this information, please do not hesitate to ask me before you leave the office.   Due to recent changes in healthcare laws, you may see results of your pathology and/or laboratory studies on MyChart before the doctors have had a chance to review them. We understand that in some cases there may be results that are confusing or concerning to you. Please understand that not all results are received at the same time and often the doctors may need to interpret multiple results in order to provide you with the best plan of care or course of treatment. Therefore, we ask that you please give us  2 business days to thoroughly review all your results before contacting the office for clarification. Should we see a critical lab result, you will be contacted sooner.   If You Need Anything After Your Visit  If you have any questions or concerns for your doctor, please call our main line at [phone] and press option 4 to reach your doctor's medical assistant. If no one answers, please leave a voicemail as directed and we will return your call as soon as possible. Messages left after 4 pm will be answered the following business day.   You may also send us  a message via MyChart. We typically respond to MyChart messages within 1-2 business days.  For prescription refills, please ask your pharmacy to contact our office. Our fax number is [phone].  If you have an urgent issue when the clinic is closed  that cannot wait until the next business day, you can page your doctor at the number below.    Please note that while we do our best to be available for urgent issues outside of office hours, we are not available 24/7.   If you have an urgent issue and are unable to reach us , you may choose to seek medical care at your doctor's office, retail clinic, urgent care center, or emergency room.  If you have a medical emergency, please immediately call 911 or go to the emergency department.  Pager Numbers  - Dr. Hester: [phone]  - Dr. Jackquline: [phone]  - Dr. Claudene: [phone]   In the event of inclement weather, please call our main line at [phone] for an update on the status of any delays or closures.  Dermatology Medication Tips: Please keep the boxes that topical medications come in in order to help keep track of the instructions about where and how to use these. Pharmacies typically print the medication instructions only on the boxes and not directly on the medication tubes.   If your medication is too expensive, please contact our office at [phone] option 4 or send us  a message through MyChart.   We are unable to tell what your co-pay for medications will  be in advance as this is different depending on your insurance coverage. However, we may be able to find a substitute medication at lower cost or fill out paperwork to get insurance to cover a needed medication.   If a prior authorization is required to get your medication covered by your insurance company, please allow us  1-2 business days to complete this process.  Drug prices often vary depending on where the prescription is filled and some pharmacies may offer cheaper prices.  The website www.goodrx.com contains coupons for medications through different pharmacies. The prices here do not account for what the cost may be with help from insurance (it may be cheaper with your insurance), but the website can give  you the price if you did not use any insurance.  - You can print the associated coupon and take it with your prescription to the pharmacy.  - You may also stop by our office during regular business hours and pick up a GoodRx coupon card.  - If you need your prescription sent electronically to a different pharmacy, notify our office through [facility] or by phone at [phone] option 4.     Si Usted Necesita Algo Despus de Su Visita  Tambin puede enviarnos un mensaje a travs de Clinical Cytogeneticist. Por lo general respondemos a los mensajes de MyChart en el transcurso de 1 a 2 das hbiles.  Para renovar recetas, por favor pida a su farmacia que se ponga en contacto con nuestra oficina. Randi lakes de fax es [place] [phone].  Si tiene un asunto urgente cuando la clnica est cerrada y que no puede esperar hasta el siguiente da hbil, puede llamar/localizar a su doctor(a) al nmero que aparece a continuacin.   Por favor, tenga en cuenta que aunque hacemos todo lo posible para estar disponibles para asuntos urgentes fuera del horario de [place], no estamos disponibles las 24 horas del da, los 7 [address]  [address] de la [place].   Si tiene un problema urgente y no puede comunicarse con nosotros, puede optar por buscar atencin mdica  en el consultorio de su doctor(a), en una clnica privada, en un centro de atencin urgente o en una sala de emergencias.  Si tiene engineer, drilling, por favor llame inmediatamente al 911 o vaya a la sala de emergencias.  Nmeros de bper  - Dr. Hester: [phone]  - Dra. Jackquline: [phone]  - Dr. Claudene: [phone]   En caso de inclemencias del tiempo, por favor llame a landry capes principal al [phone] para una actualizacin sobre el [place] de cualquier retraso o cierre.  Consejos para la medicacin en dermatologa: Por favor, guarde las cajas en las que vienen los medicamentos de uso tpico para ayudarle a seguir las instrucciones sobre dnde  y cmo usarlos. Las farmacias generalmente imprimen las instrucciones del medicamento slo en las cajas y no directamente en los tubos del [place].   Si su medicamento es muy caro, por favor, pngase en contacto con landry rieger llamando al [phone] y presione la opcin 4 o envenos un mensaje a travs de Clinical Cytogeneticist.   No podemos decirle cul ser su copago por los medicamentos por adelantado ya que esto es diferente dependiendo de la cobertura de su seguro. Sin embargo, es posible que podamos encontrar un medicamento sustituto a audiological scientist un formulario para que el seguro cubra el medicamento que se considera necesario.   Si se requiere una autorizacin previa para que su compaa de seguros cubra su medicamento, por favor permtanos de  1 a 2 das hbiles para completar [address]  Los precios de los medicamentos varan con frecuencia dependiendo del environmental consultant de dnde se surte la receta y alguna farmacias pueden ofrecer precios ms baratos.  El sitio web www.goodrx.com tiene cupones para medicamentos de health and safety inspector. Los precios aqu no tienen en cuenta lo que podra costar con la ayuda del seguro (puede ser ms barato con su seguro), pero el sitio web puede darle el precio si no utiliz tourist information centre manager.  - Puede imprimir el cupn correspondiente y llevarlo con su receta a la farmacia.  - Tambin puede pasar por nuestra oficina durante el horario de atencin regular y education officer, museum una tarjeta de cupones de GoodRx.  - Si necesita que su receta se enve electrnicamente a una farmacia diferente, informe a nuestra oficina a travs de MyChart de [place] o por telfono llamando al [phone] y presione la opcin 4.

## 2023-07-31 ENCOUNTER — Encounter: Payer: Self-pay | Admitting: Dermatology

## 2023-08-01 ENCOUNTER — Encounter: Payer: Self-pay | Admitting: Dermatology

## 2023-08-01 ENCOUNTER — Encounter: Payer: Medicare Other | Attending: Family Medicine

## 2023-08-01 VITALS — Ht 64.5 in | Wt 145.7 lb

## 2023-08-01 DIAGNOSIS — Z48812 Encounter for surgical aftercare following surgery on the circulatory system: Secondary | ICD-10-CM | POA: Diagnosis not present

## 2023-08-01 DIAGNOSIS — Z9889 Other specified postprocedural states: Secondary | ICD-10-CM | POA: Diagnosis not present

## 2023-08-01 NOTE — Patient Instructions (Signed)
 Patient Instructions  Patient Details  Name: [name] MRN: [id number] Date of Birth: October 21, 1955 Referring Provider:  Rae Bugler, MD  Below are your personal goals for exercise, nutrition, and risk factors. Our goal is to help you stay on track towards obtaining and maintaining these goals. We will be discussing your progress on these goals with you throughout the program.  Initial Exercise Prescription:  Initial Exercise Prescription - 08/01/23 1000       Date of Initial Exercise RX and Referring Provider   Date 08/01/23    Referring Provider Rae Bugler, MD      Oxygen   Maintain Oxygen Saturation 88% or higher      Treadmill   MPH 2    Grade 0    Minutes 15    METs 2.53      Recumbant Bike   Level 2    RPM 50    Watts 15    Minutes 15    METs 2.69      NuStep   Level 2    SPM 80    Minutes 15    METs 2.69      REL-XR   Level 1    Speed 50    Minutes 15    METs 2.69      T5 Nustep   Level 2    SPM 80    Minutes 15    METs 2.69      Track   Laps 30    Minutes 15    METs 2.63      Prescription Details   Frequency (times per week) 3    Duration Progress to 30 minutes of continuous aerobic without signs/symptoms of physical distress      Intensity   THRR 40-80% of Max Heartrate 103-136    Ratings of Perceived Exertion 11-13    Perceived Dyspnea 0-4      Progression   Progression Continue to progress workloads to maintain intensity without signs/symptoms of physical distress.      Resistance Training   Training Prescription Yes    Weight 3 lb    Reps 10-15             Exercise Goals: Frequency: Be able to perform aerobic exercise two to three times per week in program working toward 2-5 days per week of home exercise.  Intensity: Work with a perceived exertion of 11 (fairly light) - 15 (hard) while following your exercise prescription.  We will make changes to your prescription with you as you progress through the program.    Duration: Be able to do 30 to 45 minutes of continuous aerobic exercise in addition to a 5 minute warm-up and a 5 minute cool-down routine.   Nutrition Goals: Your personal nutrition goals will be established when you do your nutrition analysis with the dietician.  The following are general nutrition guidelines to follow: Cholesterol < 200mg /day Sodium < 1500mg /day Fiber: Women over 50 yrs - 21 grams per day  Personal Goals:  Personal Goals and Risk Factors at Admission - 08/01/23 1059       Core Components/Risk Factors/Patient Goals on Admission    Weight Management Yes;Weight Maintenance    Intervention Weight Management: Develop a combined nutrition and exercise program designed to reach desired caloric intake, while maintaining appropriate intake of nutrient and fiber, sodium and fats, and appropriate energy expenditure required for the weight goal.;Weight Management: Provide education and appropriate resources to help participant work on and attain  dietary goals.    Admit Weight 145 lb 11.2 oz (66.1 kg)    Goal Weight: Short Term 145 lb (65.8 kg)    Goal Weight: Long Term 145 lb (65.8 kg)    Expected Outcomes Short Term: Continue to assess and modify interventions until short term weight is achieved;Long Term: Adherence to nutrition and physical activity/exercise program aimed toward attainment of established weight goal;Weight Maintenance: Understanding of the daily nutrition guidelines, which includes 25-35% calories from fat, 7% or less cal from saturated fats, less than 200mg  cholesterol, less than 1.5gm of sodium, & 5 or more servings of fruits and vegetables daily;Understanding recommendations for meals to include 15-35% energy as protein, 25-35% energy from fat, 35-60% energy from carbohydrates, less than 200mg  of dietary cholesterol, 20-35 gm of total fiber daily;Understanding of distribution of calorie intake throughout the day with the consumption of 4-5 meals/snacks     Hypertension Yes    Intervention Provide education on lifestyle modifcations including regular physical activity/exercise, weight management, moderate sodium restriction and increased consumption of fresh fruit, vegetables, and low fat dairy, alcohol moderation, and smoking cessation.;Monitor prescription use compliance.    Expected Outcomes Short Term: Continued assessment and intervention until BP is < 140/65mm HG in hypertensive participants. < 130/44mm HG in hypertensive participants with diabetes, heart failure or chronic kidney disease.;Long Term: Maintenance of blood pressure at goal levels.             Tobacco Use Initial Evaluation: Social History   Tobacco Use  Smoking Status Former   Current packs/day: 0.00   Types: Cigarettes   Quit date: 02/13/1974   Years since quitting: 49.4  Smokeless Tobacco Never    Exercise Goals and Review:  Exercise Goals     Row Name 08/01/23 1053             Exercise Goals   Increase Physical Activity Yes       Intervention Provide advice, education, support and counseling about physical activity/exercise needs.;Develop an individualized exercise prescription for aerobic and resistive training based on initial evaluation findings, risk stratification, comorbidities and participant's personal goals.       Expected Outcomes Short Term: Attend rehab on a regular basis to increase amount of physical activity.;Long Term: Add in home exercise to make exercise part of routine and to increase amount of physical activity.;Long Term: Exercising regularly at least 3-5 days a week.       Increase Strength and Stamina Yes       Intervention Provide advice, education, support and counseling about physical activity/exercise needs.;Develop an individualized exercise prescription for aerobic and resistive training based on initial evaluation findings, risk stratification, comorbidities and participant's personal goals.       Expected Outcomes Short Term:  Increase workloads from initial exercise prescription for resistance, speed, and METs.;Short Term: Perform resistance training exercises routinely during rehab and add in resistance training at home;Long Term: Improve cardiorespiratory fitness, muscular endurance and strength as measured by increased METs and functional capacity ( )       Able to understand and use rate of perceived exertion (RPE) scale Yes       Intervention Provide education and explanation on how to use RPE scale       Expected Outcomes Short Term: Able to use RPE daily in rehab to express subjective intensity level;Long Term:  Able to use RPE to guide intensity level when exercising independently       Able to understand and use Dyspnea scale Yes  Intervention Provide education and explanation on how to use Dyspnea scale       Expected Outcomes Short Term: Able to use Dyspnea scale daily in rehab to express subjective sense of shortness of breath during exertion;Long Term: Able to use Dyspnea scale to guide intensity level when exercising independently       Knowledge and understanding of Target Heart Rate Range (THRR) Yes       Intervention Provide education and explanation of THRR including how the numbers were predicted and where they are located for reference       Expected Outcomes Short Term: Able to state/look up THRR;Short Term: Able to use daily as guideline for intensity in rehab;Long Term: Able to use THRR to govern intensity when exercising independently       Able to check pulse independently Yes       Intervention Provide education and demonstration on how to check pulse in carotid and radial arteries.;Review the importance of being able to check your own pulse for safety during independent exercise       Expected Outcomes Short Term: Able to explain why pulse checking is important during independent exercise;Long Term: Able to check pulse independently and accurately       Understanding of Exercise  Prescription Yes       Intervention Provide education, explanation, and written materials on patient's individual exercise prescription       Expected Outcomes Short Term: Able to explain program exercise prescription;Long Term: Able to explain home exercise prescription to exercise independently

## 2023-08-01 NOTE — Progress Notes (Signed)
 Cardiac Individual Treatment Plan  Patient Details  Name: [name] MRN: [id number] Date of Birth: 10-08-1955 Referring Provider:   Flowsheet Row Cardiac Rehab from 08/01/2023 in [facility] Cardiac and Pulmonary Rehab  Referring Provider Rae Bugler, MD       Initial Encounter Date:  Flowsheet Row Cardiac Rehab from 08/01/2023 in [facility] Cardiac and Pulmonary Rehab  Date 08/01/23       Visit Diagnosis: S/P aortic valve repair  S/P aortic dissection repair  Patient's Home Medications on Admission:  Current Outpatient Medications:    acetaminophen  (TYLENOL ) 500 MG tablet, Take 500 mg by mouth every 6 (six) hours as needed., Disp: , Rfl:    aspirin 81 MG chewable tablet, Chew 81 mg by mouth daily., Disp: , Rfl:    calcipotriene  (DOVONOX) 0.005 % cream, Apply to aa BID x 7 days, Disp: 60 g, Rfl: 0   calcium carbonate (OSCAL) 1500 (600 Ca) MG TABS tablet, Take 600 mg of elemental calcium by mouth daily., Disp: , Rfl:    cholestyramine light (PREVALITE) 4 g packet, Take 4 g by mouth 2 (two) times daily., Disp: , Rfl:    clobetasol  cream (TEMOVATE ) 0.05 %, Apply twice daily up to 2 weeks to affected area on left thigh and left ankle. Avoid applying to face, groin, and axilla., Disp: 30 g, Rfl: 1   fluorouracil  (EFUDEX ) 5 % cream, Apply topically 2 (two) times daily. Apply to aa BID x 7 days, Disp: 40 g, Rfl: 0   GLUCOSAMINE HCL PO, Take 1 tablet by mouth daily., Disp: , Rfl:    hydrochlorothiazide (HYDRODIURIL) 50 MG tablet, Take 50 mg by mouth daily., Disp: , Rfl:    loperamide (IMODIUM A-D) 2 MG tablet, Take 2 mg by mouth 4 (four) times daily as needed., Disp: , Rfl:    losartan  (COZAAR ) 50 MG tablet, Take 1 tablet by mouth daily., Disp: , Rfl:    metoprolol tartrate (LOPRESSOR) 25 MG tablet, Take 25 mg by mouth 2 (two) times daily., Disp: , Rfl:    mupirocin  ointment (BACTROBAN ) 2 %, Apply 1 Application topically daily., Disp: 22 g, Rfl: 0   Niacin (VITAMIN B-3 PO), Take 1 tablet by  mouth daily., Disp: , Rfl:    NON FORMULARY, Pt uses a cpap nightly, Disp: , Rfl:    omeprazole (PRILOSEC) 20 MG capsule, Take 20 mg by mouth daily., Disp: , Rfl:    oxyCODONE  (OXY IR/ROXICODONE ) 5 MG immediate release tablet, Take 1 tablet (5 mg total) by mouth every 6 (six) hours as needed for severe pain., Disp: 20 tablet, Rfl: 0   POTASSIUM PO, Take 1 tablet by mouth daily., Disp: , Rfl:    triamcinolone  cream (KENALOG ) 0.1 %, " Cool Down Ointment" If needed after stopping treatment with Fluorouracil , apply twice a day for 3 days for face and up to 7 days for body areas to calm down inflammation, Disp: 80 g, Rfl: 0   triamcinolone  ointment (KENALOG ) 0.1 %, " Cool Down Ointment" If needed after stopping treatment with Fluorouracil , apply twice a day for 3 days for face and up to 7 days for body areas to calm down inflammation, Disp: 80 g, Rfl: 0  Past Medical History: Past Medical History:  Diagnosis Date   Actinic keratosis    Arthritis    Diverticulosis    Dysplastic nevus 09/27/2009   Left post waistline. Mild atypia. Limited margins free.   History of gallstones    History of hiatal hernia  Hx of basal cell carcinoma 06/18/2012   L bra line infrascapular   Hx of squamous cell carcinoma of skin    multiple sites   Hypertension    Peptic ulcer disease 1991   Sleep apnea    cpap   Squamous cell carcinoma in situ (SCCIS) 04/26/2022   left abdomen ED&C 07/03/22   Squamous cell carcinoma of skin 05/07/2007   Right medial superior breast. SCCis   Squamous cell carcinoma of skin 06/09/2013   Right medial calf. WD SCC. Recurrent 07/28/2013. Tx: EDC   Squamous cell carcinoma of skin 01/13/2014   Left medial pretibial. SCC, KA pattern   Squamous cell carcinoma of skin 09/06/2016   Left medial lower leg. WD SCC   Squamous cell carcinoma of skin 11/07/2017   Right distal pretibial. WD SCC. Tx: EDC   Squamous cell carcinoma of skin 07/03/2022   Left pretibia. EDC    Tobacco  Use: Social History   Tobacco Use  Smoking Status Former   Current packs/day: 0.00   Types: Cigarettes   Quit date: 02/13/1974   Years since quitting: 49.4  Smokeless Tobacco Never    Labs: Review Flowsheet       Latest Ref Rng & Units 05/22/2023  Labs for ITP Cardiac and Pulmonary Rehab  TCO2 22 - 32 mmol/L 27      Exercise Target Goals: Exercise Program Goal: Individual exercise prescription set using results from initial 6 min walk test and THRR while considering  patient's activity barriers and safety.   Exercise Prescription Goal: Initial exercise prescription builds to 30-45 minutes a day of aerobic activity, 2-3 days per week.  Home exercise guidelines will be given to patient during program as part of exercise prescription that the participant will acknowledge.   Education: Aerobic Exercise: - Group verbal and visual presentation on the components of exercise prescription. Introduces F.I.T.T principle from ACSM for exercise prescriptions.  Reviews F.I.T.T. principles of aerobic exercise including progression. Written material given at graduation.   Education: Resistance Exercise: - Group verbal and visual presentation on the components of exercise prescription. Introduces F.I.T.T principle from ACSM for exercise prescriptions  Reviews F.I.T.T. principles of resistance exercise including progression. Written material given at graduation.    Education: Exercise & Equipment Safety: - Individual verbal instruction and demonstration of equipment use and safety with use of the equipment. Flowsheet Row Cardiac Rehab from 08/01/2023 in [facility] Cardiac and Pulmonary Rehab  Date 08/01/23  Educator MB  Instruction Review Code 1- Verbalizes Understanding       Education: Exercise Physiology & General Exercise Guidelines: - Group verbal and written instruction with models to review the exercise physiology of the cardiovascular system and associated critical values. Provides  general exercise guidelines with specific guidelines to those with heart or lung disease.    Education: Flexibility, Balance, Mind/Body Relaxation: - Group verbal and visual presentation with interactive activity on the components of exercise prescription. Introduces F.I.T.T principle from ACSM for exercise prescriptions. Reviews F.I.T.T. principles of flexibility and balance exercise training including progression. Also discusses the mind body connection.  Reviews various relaxation techniques to help reduce and manage stress (i.e. Deep breathing, progressive muscle relaxation, and visualization). Balance handout provided to take home. Written material given at graduation.   Activity Barriers & Risk Stratification:  Activity Barriers & Cardiac Risk Stratification - 08/01/23 1049       Activity Barriers & Cardiac Risk Stratification   Activity Barriers Joint Problems   Bilateral hip pain   Cardiac  Risk Stratification Low             6 Minute Walk:  6 Minute Walk     Row Name 08/01/23 1048         6 Minute Walk   Phase Initial     Distance 1085 feet     Walk Time 6 minutes     # of Rest Breaks 0     MPH 2.05     METS 2.69     RPE 12     Perceived Dyspnea  1     VO2 Peak 9.41     Symptoms Yes (comment)     Comments Bilateral hip pain 7/10 at end of test     Resting HR 76 bpm     Resting BP 138/84     Resting Oxygen Saturation  98 %     Exercise Oxygen Saturation  during 6 min walk 96 %     Max Ex. HR 81 bpm     Max Ex. BP 144/80     2 Minute Post BP 130/80              Oxygen Initial Assessment:   Oxygen Re-Evaluation:   Oxygen Discharge (Final Oxygen Re-Evaluation):   Initial Exercise Prescription:  Initial Exercise Prescription - 08/01/23 1000       Date of Initial Exercise RX and Referring Provider   Date 08/01/23    Referring Provider Rae Bugler, MD      Oxygen   Maintain Oxygen Saturation 88% or higher      Treadmill   MPH 2    Grade 0     Minutes 15    METs 2.53      Recumbant Bike   Level 2    RPM 50    Watts 15    Minutes 15    METs 2.69      NuStep   Level 2    SPM 80    Minutes 15    METs 2.69      REL-XR   Level 1    Speed 50    Minutes 15    METs 2.69      T5 Nustep   Level 2    SPM 80    Minutes 15    METs 2.69      Track   Laps 30    Minutes 15    METs 2.63      Prescription Details   Frequency (times per week) 3    Duration Progress to 30 minutes of continuous aerobic without signs/symptoms of physical distress      Intensity   THRR 40-80% of Max Heartrate 103-136    Ratings of Perceived Exertion 11-13    Perceived Dyspnea 0-4      Progression   Progression Continue to progress workloads to maintain intensity without signs/symptoms of physical distress.      Resistance Training   Training Prescription Yes    Weight 3 lb    Reps 10-15             Perform Capillary Blood Glucose checks as needed.  Exercise Prescription Changes:   Exercise Prescription Changes     Row Name 08/01/23 1000             Response to Exercise   Blood Pressure (Admit) 138/84       Blood Pressure (Exercise) 144/80       Blood Pressure (Exit) 130/80  Heart Rate (Admit) 76 bpm       Heart Rate (Exercise) 81 bpm       Heart Rate (Exit) 70 bpm       Oxygen Saturation (Admit) 98 %       Oxygen Saturation (Exercise) 96 %       Oxygen Saturation (Exit) 96 %       Rating of Perceived Exertion (Exercise) 12       Perceived Dyspnea (Exercise) 1       Symptoms Bilateral hip pain 7/10       Comments Results       Duration Progress to 30 minutes of  aerobic without signs/symptoms of physical distress       Intensity THRR New         Progression   Progression Continue to progress workloads to maintain intensity without signs/symptoms of physical distress.       Average METs 2.69                Exercise Comments:   Exercise Goals and Review:   Exercise Goals     Row Name  08/01/23 1053             Exercise Goals   Increase Physical Activity Yes       Intervention Provide advice, education, support and counseling about physical activity/exercise needs.;Develop an individualized exercise prescription for aerobic and resistive training based on initial evaluation findings, risk stratification, comorbidities and participant's personal goals.       Expected Outcomes Short Term: Attend rehab on a regular basis to increase amount of physical activity.;Long Term: Add in home exercise to make exercise part of routine and to increase amount of physical activity.;Long Term: Exercising regularly at least 3-5 days a week.       Increase Strength and Stamina Yes       Intervention Provide advice, education, support and counseling about physical activity/exercise needs.;Develop an individualized exercise prescription for aerobic and resistive training based on initial evaluation findings, risk stratification, comorbidities and participant's personal goals.       Expected Outcomes Short Term: Increase workloads from initial exercise prescription for resistance, speed, and METs.;Short Term: Perform resistance training exercises routinely during rehab and add in resistance training at home;Long Term: Improve cardiorespiratory fitness, muscular endurance and strength as measured by increased METs and functional capacity ( )       Able to understand and use rate of perceived exertion (RPE) scale Yes       Intervention Provide education and explanation on how to use RPE scale       Expected Outcomes Short Term: Able to use RPE daily in rehab to express subjective intensity level;Long Term:  Able to use RPE to guide intensity level when exercising independently       Able to understand and use Dyspnea scale Yes       Intervention Provide education and explanation on how to use Dyspnea scale       Expected Outcomes Short Term: Able to use Dyspnea scale daily in rehab to express  subjective sense of shortness of breath during exertion;Long Term: Able to use Dyspnea scale to guide intensity level when exercising independently       Knowledge and understanding of Target Heart Rate Range (THRR) Yes       Intervention Provide education and explanation of THRR including how the numbers were predicted and where they are located for reference       Expected  Outcomes Short Term: Able to state/look up THRR;Short Term: Able to use daily as guideline for intensity in rehab;Long Term: Able to use THRR to govern intensity when exercising independently       Able to check pulse independently Yes       Intervention Provide education and demonstration on how to check pulse in carotid and radial arteries.;Review the importance of being able to check your own pulse for safety during independent exercise       Expected Outcomes Short Term: Able to explain why pulse checking is important during independent exercise;Long Term: Able to check pulse independently and accurately       Understanding of Exercise Prescription Yes       Intervention Provide education, explanation, and written materials on patient's individual exercise prescription       Expected Outcomes Short Term: Able to explain program exercise prescription;Long Term: Able to explain home exercise prescription to exercise independently                Exercise Goals Re-Evaluation :   Discharge Exercise Prescription (Final Exercise Prescription Changes):  Exercise Prescription Changes - 08/01/23 1000       Response to Exercise   Blood Pressure (Admit) 138/84    Blood Pressure (Exercise) 144/80    Blood Pressure (Exit) 130/80    Heart Rate (Admit) 76 bpm    Heart Rate (Exercise) 81 bpm    Heart Rate (Exit) 70 bpm    Oxygen Saturation (Admit) 98 %    Oxygen Saturation (Exercise) 96 %    Oxygen Saturation (Exit) 96 %    Rating of Perceived Exertion (Exercise) 12    Perceived Dyspnea (Exercise) 1    Symptoms Bilateral  hip pain 7/10    Comments Results    Duration Progress to 30 minutes of  aerobic without signs/symptoms of physical distress    Intensity THRR New      Progression   Progression Continue to progress workloads to maintain intensity without signs/symptoms of physical distress.    Average METs 2.69             Nutrition:  Target Goals: Understanding of nutrition guidelines, daily intake of sodium 1500mg , cholesterol 200mg , calories 30% from fat and 7% or less from saturated fats, daily to have 5 or more servings of fruits and vegetables.  Education: All About Nutrition: -Group instruction provided by verbal, written material, interactive activities, discussions, models, and posters to present general guidelines for heart healthy nutrition including fat, fiber, MyPlate, the role of sodium in heart healthy nutrition, utilization of the nutrition label, and utilization of this knowledge for meal planning. Follow up email sent as well. Written material given at graduation. Flowsheet Row Cardiac Rehab from 08/01/2023 in [facility] Cardiac and Pulmonary Rehab  Education need identified 08/01/23       Biometrics:  Pre Biometrics - 08/01/23 1054       Pre Biometrics   Height 5' 4.5" (1.638 m)    Weight 145 lb 11.2 oz (66.1 kg)    Waist Circumference 37.5 inches    Hip Circumference 39.5 inches    Waist to Hip Ratio 0.95 %    BMI (Calculated) 24.63    Single Leg Stand 30 seconds              Nutrition Therapy Plan and Nutrition Goals:  Nutrition Therapy & Goals - 08/01/23 1055       Personal Nutrition Goals   Nutrition Goal Will meet with  RD on 1/27      Intervention Plan   Intervention Prescribe, educate and counsel regarding individualized specific dietary modifications aiming towards targeted core components such as weight, hypertension, lipid management, diabetes, heart failure and other comorbidities.;Nutrition handout(s) given to patient.    Expected Outcomes Short  Term Goal: Understand basic principles of dietary content, such as calories, fat, sodium, cholesterol and nutrients.             Nutrition Assessments:  MEDIFICTS Score Key: >=70 Need to make dietary changes  40-70 Heart Healthy Diet <= 40 Therapeutic Level Cholesterol Diet  Flowsheet Row Cardiac Rehab from 08/01/2023 in [facility] Cardiac and Pulmonary Rehab  Picture Your Plate Total Score on Admission 64      Picture Your Plate Scores: <60 Unhealthy dietary pattern with much room for improvement. 41-50 Dietary pattern unlikely to meet recommendations for good health and room for improvement. 51-60 More healthful dietary pattern, with some room for improvement.  >60 Healthy dietary pattern, although there may be some specific behaviors that could be improved.    Nutrition Goals Re-Evaluation:   Nutrition Goals Discharge (Final Nutrition Goals Re-Evaluation):   Psychosocial: Target Goals: Acknowledge presence or absence of significant depression and/or stress, maximize coping skills, provide positive support system. Participant is able to verbalize types and ability to use techniques and skills needed for reducing stress and depression.   Education: Stress, Anxiety, and Depression - Group verbal and visual presentation to define topics covered.  Reviews how body is impacted by stress, anxiety, and depression.  Also discusses healthy ways to reduce stress and to treat/manage anxiety and depression.  Written material given at graduation.   Education: Sleep Hygiene -Provides group verbal and written instruction about how sleep can affect your health.  Define sleep hygiene, discuss sleep cycles and impact of sleep habits. Review good sleep hygiene tips.    Initial Review & Psychosocial Screening:  Initial Psych Review & Screening - 06/18/23 0944       Initial Review   Current issues with None Identified      Family Dynamics   Good Support System? Yes   husband  [name]      Barriers   Psychosocial barriers to participate in program There are no identifiable barriers or psychosocial needs.      Screening Interventions   Interventions Encouraged to exercise;To provide support and resources with identified psychosocial needs;Provide feedback about the scores to participant    Expected Outcomes Short Term goal: Utilizing psychosocial counselor, staff and physician to assist with identification of specific Stressors or current issues interfering with healing process. Setting desired goal for each stressor or current issue identified.;Long Term Goal: Stressors or current issues are controlled or eliminated.;Short Term goal: Identification and review with participant of any Quality of Life or Depression concerns found by scoring the questionnaire.;Long Term goal: The participant improves quality of Life and PHQ9 Scores as seen by post scores and/or verbalization of changes             Quality of Life Scores:   Quality of Life - 08/01/23 1054       Quality of Life   Select Quality of Life      Quality of Life Scores   Health/Function Pre 22.5 %    Socioeconomic Pre 24.69 %    Psych/Spiritual Pre 25.36 %    Family Pre 30 %    GLOBAL Pre 24.64 %            Scores  of 19 and below usually indicate a poorer quality of life in these areas.  A difference of  2-3 points is a clinically meaningful difference.  A difference of 2-3 points in the total score of the Quality of Life Index has been associated with significant improvement in overall quality of life, self-image, physical symptoms, and general health in studies assessing change in quality of life.  PHQ-9: Review Flowsheet       08/01/2023  Depression screen PHQ 2/9  Decreased Interest 0  Down, Depressed, Hopeless 0  PHQ - 2 Score 0  Altered sleeping 1  Tired, decreased energy 1  Change in appetite 0  Feeling bad or failure about yourself  0  Trouble concentrating 0  Moving slowly or  fidgety/restless 0  Suicidal thoughts 0  PHQ-9 Score 2  Difficult doing work/chores Not difficult at all   Interpretation of Total Score  Total Score Depression Severity:  1-4 = Minimal depression, 5-9 = Mild depression, 10-14 = Moderate depression, 15-19 = Moderately severe depression, 20-27 = Severe depression   Psychosocial Evaluation and Intervention:  Psychosocial Evaluation - 06/18/23 0956       Psychosocial Evaluation & Interventions   Interventions Encouraged to exercise with the program and follow exercise prescription    Comments Miosha has no barriers to attending the program. She lives with her husband and he is her support person.  She is ready to start the program after her home PT.    Expected Outcomes STG attends all scheduled sessions  LTG utilizes education and resources obtained during the program to contineu her healthy lifestyle steps.    Continue Psychosocial Services  Follow up required by staff             Psychosocial Re-Evaluation:   Psychosocial Discharge (Final Psychosocial Re-Evaluation):   Vocational Rehabilitation: Provide vocational rehab assistance to qualifying candidates.   Vocational Rehab Evaluation & Intervention:  Vocational Rehab - 06/18/23 0946       Initial Vocational Rehab Evaluation & Intervention   Assessment shows need for Vocational Rehabilitation No      Vocational Rehab Re-Evaulation   Comments retired             Education: Education Goals: Education classes will be provided on a variety of topics geared toward better understanding of heart health and risk factor modification. Participant will state understanding/return demonstration of topics presented as noted by education test scores.  Learning Barriers/Preferences:  Learning Barriers/Preferences - 06/18/23 0945       Learning Barriers/Preferences   Learning Barriers Hearing   wears hearing aid   Learning Preferences None             General Cardiac  Education Topics:  AED/CPR: - Group verbal and written instruction with the use of models to demonstrate the basic use of the AED with the basic ABC's of resuscitation.   Anatomy and Cardiac Procedures: - Group verbal and visual presentation and models provide information about basic cardiac anatomy and function. Reviews the testing methods done to diagnose heart disease and the outcomes of the test results. Describes the treatment choices: Medical Management, Angioplasty, or Coronary Bypass Surgery for treating various heart conditions including Myocardial Infarction, Angina, Valve Disease, and Cardiac Arrhythmias.  Written material given at graduation.   Medication Safety: - Group verbal and visual instruction to review commonly prescribed medications for heart and lung disease. Reviews the medication, class of the drug, and side effects. Includes the steps to properly store meds and  maintain the prescription regimen.  Written material given at graduation.   Intimacy: - Group verbal instruction through game format to discuss how heart and lung disease can affect sexual intimacy. Written material given at graduation..   Know Your Numbers and Heart Failure: - Group verbal and visual instruction to discuss disease risk factors for cardiac and pulmonary disease and treatment options.  Reviews associated critical values for Overweight/Obesity, Hypertension, Cholesterol, and Diabetes.  Discusses basics of heart failure: signs/symptoms and treatments.  Introduces Heart Failure Zone chart for action plan for heart failure.  Written material given at graduation.   Infection Prevention: - Provides verbal and written material to individual with discussion of infection control including proper hand washing and proper equipment cleaning during exercise session. Flowsheet Row Cardiac Rehab from 08/01/2023 in [facility] Cardiac and Pulmonary Rehab  Date 08/01/23  Educator MB  Instruction Review Code 1-  Verbalizes Understanding       Falls Prevention: - Provides verbal and written material to individual with discussion of falls prevention and safety. Flowsheet Row Cardiac Rehab from 08/01/2023 in [facility] Cardiac and Pulmonary Rehab  Date 08/01/23  Educator MB  Instruction Review Code 1- Verbalizes Understanding       Other: -Provides group and verbal instruction on various topics (see comments)   Knowledge Questionnaire Score:  Knowledge Questionnaire Score - 08/01/23 1059       Knowledge Questionnaire Score   Pre Score 25/26             Core Components/Risk Factors/Patient Goals at Admission:  Personal Goals and Risk Factors at Admission - 08/01/23 1059       Core Components/Risk Factors/Patient Goals on Admission    Weight Management Yes;Weight Maintenance    Intervention Weight Management: Develop a combined nutrition and exercise program designed to reach desired caloric intake, while maintaining appropriate intake of nutrient and fiber, sodium and fats, and appropriate energy expenditure required for the weight goal.;Weight Management: Provide education and appropriate resources to help participant work on and attain dietary goals.    Admit Weight 145 lb 11.2 oz (66.1 kg)    Goal Weight: Short Term 145 lb (65.8 kg)    Goal Weight: Long Term 145 lb (65.8 kg)    Expected Outcomes Short Term: Continue to assess and modify interventions until short term weight is achieved;Long Term: Adherence to nutrition and physical activity/exercise program aimed toward attainment of established weight goal;Weight Maintenance: Understanding of the daily nutrition guidelines, which includes 25-35% calories from fat, 7% or less cal from saturated fats, less than 200mg  cholesterol, less than 1.5gm of sodium, & 5 or more servings of fruits and vegetables daily;Understanding recommendations for meals to include 15-35% energy as protein, 25-35% energy from fat, 35-60% energy from carbohydrates,  less than 200mg  of dietary cholesterol, 20-35 gm of total fiber daily;Understanding of distribution of calorie intake throughout the day with the consumption of 4-5 meals/snacks    Hypertension Yes    Intervention Provide education on lifestyle modifcations including regular physical activity/exercise, weight management, moderate sodium restriction and increased consumption of fresh fruit, vegetables, and low fat dairy, alcohol moderation, and smoking cessation.;Monitor prescription use compliance.    Expected Outcomes Short Term: Continued assessment and intervention until BP is < 140/65mm HG in hypertensive participants. < 130/12mm HG in hypertensive participants with diabetes, heart failure or chronic kidney disease.;Long Term: Maintenance of blood pressure at goal levels.             Education:Diabetes - Individual verbal and written  instruction to review signs/symptoms of diabetes, desired ranges of glucose level fasting, after meals and with exercise. Acknowledge that pre and post exercise glucose checks will be done for 3 sessions at entry of program.   Core Components/Risk Factors/Patient Goals Review:    Core Components/Risk Factors/Patient Goals at Discharge (Final Review):    ITP Comments:  ITP Comments     Row Name 06/18/23 0954 08/01/23 1047         ITP Comments Virtual orientation call completed today. shehas an appointment on Date: will schedule for Jan after [facility] PT completed  for EP eval and gym Orientation.  Documentation of diagnosis can be found in [facility] Date: 05/22/2023 . Completed and gym orientation. Initial ITP created and sent for review to Dr. Firman Hughes, Medical Director.               Comments: Initial ITP

## 2023-08-02 ENCOUNTER — Ambulatory Visit: Payer: Medicare Other | Attending: Internal Medicine | Admitting: Internal Medicine

## 2023-08-02 ENCOUNTER — Encounter: Payer: Self-pay | Admitting: Dermatology

## 2023-08-02 VITALS — BP 147/89 | HR 69 | Ht 63.0 in | Wt 143.0 lb

## 2023-08-02 DIAGNOSIS — I7111 Aneurysm of the ascending aorta, ruptured: Secondary | ICD-10-CM | POA: Insufficient documentation

## 2023-08-02 DIAGNOSIS — I10 Essential (primary) hypertension: Secondary | ICD-10-CM

## 2023-08-02 DIAGNOSIS — Z8679 Personal history of other diseases of the circulatory system: Secondary | ICD-10-CM | POA: Diagnosis not present

## 2023-08-02 DIAGNOSIS — G4733 Obstructive sleep apnea (adult) (pediatric): Secondary | ICD-10-CM | POA: Diagnosis not present

## 2023-08-02 MED ORDER — LOSARTAN POTASSIUM 100 MG PO TABS: 100.0000 mg | ORAL_TABLET | Freq: Every day | ORAL | 3 refills | Status: DC

## 2023-08-02 NOTE — Patient Instructions (Signed)
Medication Instructions:  Your physician has recommended you make the following change in your medication:  INCREASE: losartan (Cozaar) to 100 mg by mouth once daily  *If you need a refill on your cardiac medications before your next appointment, please call your pharmacy*   Lab Work: IN 1 WEEK: BMP  If you have labs (blood work) drawn today and your tests are completely normal, you will receive your results only by: MyChart Message (if you have MyChart) OR A paper copy in the mail If you have any lab test that is abnormal or we need to change your treatment, we will call you to review the results.   Testing/Procedures: Your physician has requested that you have an echocardiogram. Echocardiography is a painless test that uses sound waves to create images of your heart. It provides your doctor with information about the size and shape of your heart and how well your heart's chambers and valves are working. This procedure takes approximately one hour. There are no restrictions for this procedure. Please do NOT wear cologne, perfume, aftershave, or lotions (deodorant is allowed). Please arrive 15 minutes prior to your appointment time.  Please note: We ask at that you not bring children with you during ultrasound (echo/ vascular) testing. Due to room size and safety concerns, children are not allowed in the ultrasound rooms during exams. Our front office staff cannot provide observation of children in our lobby area while testing is being conducted. An adult accompanying a patient to their appointment will only be allowed in the ultrasound room at the discretion of the ultrasound technician under special circumstances. We apologize for any inconvenience.  Your physician has referred you to the Pharmacy Clinic for Hypertension.    NOV 2025- - - Your physician has requested that you have a CT Aorta.    Follow-Up: At [facility], you and your health needs are our priority.  As part  of our continuing mission to provide you with exceptional heart care, we have created designated Provider Care Teams.  These Care Teams include your primary Cardiologist (physician) and Advanced Practice Providers (APPs -  Physician Assistants and Nurse Practitioners) who all work together to provide you with the care you need, when you need it.    Your next appointment:   4-5 month(s)  Provider:   Jari Favre, PA-C, Ronie Spies, PA-C, Robin Searing, NP, Jacolyn Reedy, PA-C, Eligha Bridegroom, NP, Tereso Newcomer, PA-C, or Perlie Gold, PA-C

## 2023-08-02 NOTE — Progress Notes (Signed)
Cardiology Office Note:  .    Date:  08/02/2023  ID:  [name], DOB [birth year], MRN [id number] PCP: Tally Joe, MD  [facility] Providers Cardiologist:  None     CC: HTN Consulted for the evaluation of Aortic dissection at the behest of Dr. Azucena Cecil   History of Present Illness: .    [name] is a 68 y.o. female with hypertensive disease who had a dissection s/p Freestyle valve, ascending and arch repair 05/20/24  [name], is a 68 year old individual with a history of a type A aortic dissection, hypertension, and obstructive sleep apnea. The patient underwent surgery for the aortic dissection on May 21, 2024, which involved a freestyle valve and aortic root replacement. The patient's postoperative care was managed at [facility].  The patient's primary concern is uncontrolled hypertension, which she believes led to the aortic dissection. The patient has been experiencing high blood pressure readings despite being on multiple antihypertensive medications, including losartan and metoprolol. The patient also reported taking triamterene-hydrochlorothiazide.  The patient's family history is significant for hypertension in the patient's mother and brother. However, there is no family history of aortic aneurysms or dissections. The patient has two sons.  The patient reported feeling generally well, with strength returning post-surgery. However, the patient expressed anxiety related to the uncontrolled blood pressure. The patient is currently taking aspirin postoperatively and is expected to continue this for six months.  The patient's hypertension has been persistent since her early forties. The patient has been using two different blood pressure monitors at home, and the readings have been consistent with the readings taken during medical consultations.  Relevant histories: .  Social two son, no hx of aortopathy ROS: As per HPI.   Physical Exam:    VS:  BP  (!) 147/89 (BP Location: Right Arm)   Pulse 69   Ht 5\' 3"  (1.6 m)   Wt 143 lb (64.9 kg)   SpO2 97%   BMI 25.33 kg/m    Wt Readings from Last 3 Encounters:  08/02/23 143 lb (64.9 kg)  08/01/23 145 lb 11.2 oz (66.1 kg)  05/22/23 158 lb (71.7 kg)    Gen: no distress  Neck: No JVD Cardiac: No Rubs or Gallops, No murmur, RRR +2 radial pulses Respiratory: Clear to auscultation bilaterally, normal effort, normal  respiratory rate GI: Soft, nontender, non-distended  MS: No  edema;  moves all extremities Integument: Skin feels warm Neuro:  At time of evaluation, alert and oriented to person/place/time/situation  Psych: Normal affect, patient feels well   ASSESSMENT AND PLAN: .    Type A Aortic Dissection with Thoracic Aortic Aneurysm Post-surgical follow-up for type A aortic dissection with thoracic aortic aneurysm and aortic arch aneurysm. Underwent aortic root replacement and Freestyle valve implantation on 05/21/2024. No coronary reimplantation per report. Emphasized the importance of annual CT scans and echocardiograms to monitor for recurrence and blood pressure control to prevent complications. Genetic testing for aortopathy not indicated due to lack of family history. - Order echocardiogram to establish baseline cardiac function - Schedule CT aorta in November 2025 for annual follow-up - Recommend echocardiogram screening for two sons to rule out aortopathy - Add levofloxacin as an allergy in the chart  Hypertension Uncontrolled hypertension, critical to manage for preventing recurrence of aortic dissection and aneurysm. Current medications: metoprolol, losartan, and triamterene HCTZ. Plan to increase losartan to 100 mg daily. Discussed monitoring kidney function due to medication effects and losartan's benefits for tissue remodeling in  aortic dissection. Stepwise approach to medication adjustments if blood pressure remains uncontrolled, including potential switch to irbesartan or  valsartan, and adding Entresto if needed. - Increase losartan to 100 mg daily - Order basic metabolic panel in 1-2 weeks to monitor kidney function - Consider switching to irbesartan or valsartan if blood pressure remains uncontrolled - Consider adding Entresto for refractory blood pressure if needed on the above - consolidation to metoprolol tartrate 50 mg PO BID - Follow up with PharmD in 1 month to assess blood pressure control  Obstructive Sleep Apnea - Continue current management for obstructive sleep apnea  General Health Maintenance Emphasized lifestyle modifications and preventive measures to reduce cardiovascular risk. Discussed support groups for coping with the psychological impact of aortic dissection (Aortic Hope, Dirk Dress). - Continue aspirin for 6 months postoperatively - Discuss support groups for coping with the psychological impact of aortic dissection (Aortic Hope, Dirk Dress) - discussed family screening  Follow-up - Follow up with PharmD in 1 month - Follow up with practioniner in 4-5 months - Schedule CT aorta in November 2025.   April Lam, MD FASE [facility] Cardiologist [facility]  [address] [place], [address] [place], [place] [address] [phone]  10:47 AM

## 2023-08-06 ENCOUNTER — Encounter: Payer: Medicare Other | Admitting: *Deleted

## 2023-08-06 DIAGNOSIS — Z9889 Other specified postprocedural states: Secondary | ICD-10-CM | POA: Diagnosis not present

## 2023-08-06 DIAGNOSIS — Z48812 Encounter for surgical aftercare following surgery on the circulatory system: Secondary | ICD-10-CM | POA: Diagnosis not present

## 2023-08-06 NOTE — Progress Notes (Signed)
Daily Session Note  Patient Details  Name: DEBORAHA OTWELL MRN: [id number] Date of Birth: [birth year] Referring Provider:   Flowsheet Row Cardiac Rehab from 08/01/2023 in [facility] Cardiac and Pulmonary Rehab  Referring Provider Tally Joe, MD       Encounter Date: 08/06/2023  Check In:  Session Check In - 08/06/23 0905       Check-In   Supervising physician immediately available to respond to emergencies See telemetry face sheet for immediately available ER MD    Location [facility]-Cardiac & Pulmonary Rehab    Staff Present Maxon Conetta BS, Exercise Physiologist;Kelly Cloretta Ned, ACSM CEP, Exercise Physiologist;Terion Hedman Jewel Baize RN,BSN    Virtual Visit No    Medication changes reported     No    Fall or balance concerns reported    No    Warm-up and Cool-down Performed on first and last piece of equipment    Resistance Training Performed Yes    VAD Patient? No    PAD/SET Patient? No      Pain Assessment   Currently in Pain? No/denies                Social History   Tobacco Use  Smoking Status Former   Current packs/day: 0.00   Types: Cigarettes   Quit date: 02/13/1974   Years since quitting: 49.5  Smokeless Tobacco Never    Goals Met:  Independence with exercise equipment Exercise tolerated well No report of concerns or symptoms today Strength training completed today  Goals Unmet:  Not Applicable  Comments:First full day of exercise!  Patient was oriented to gym and equipment including functions, settings, policies, and procedures.  Patient's individual exercise prescription and treatment plan were reviewed.  All starting workloads were established based on the results of the 6 minute walk test done at initial orientation visit.  The plan for exercise progression was also introduced and progression will be customized based on patient's performance and goals.    Dr. Bethann Punches is Medical Director for [facility] Cardiac Rehabilitation.  Dr. Vida Rigger is  Medical Director for [facility] Pulmonary Rehabilitation.

## 2023-08-14 DIAGNOSIS — Z8679 Personal history of other diseases of the circulatory system: Secondary | ICD-10-CM | POA: Diagnosis not present

## 2023-08-14 DIAGNOSIS — I10 Essential (primary) hypertension: Secondary | ICD-10-CM | POA: Diagnosis not present

## 2023-08-14 DIAGNOSIS — G4733 Obstructive sleep apnea (adult) (pediatric): Secondary | ICD-10-CM | POA: Diagnosis not present

## 2023-08-14 LAB — BASIC METABOLIC PANEL
BUN/Creatinine Ratio: 15 (ref 12–28)
BUN: 19 mg/dL (ref 8–27)
CO2: 25 mmol/L (ref 20–29)
Calcium: 9.8 mg/dL (ref 8.7–10.3)
Chloride: 101 mmol/L (ref 96–106)
Creatinine, Ser: 1.24 mg/dL — ABNORMAL HIGH (ref 0.57–1.00)
Glucose: 95 mg/dL (ref 70–99)
Potassium: 3.5 mmol/L (ref 3.5–5.2)
Sodium: 143 mmol/L (ref 134–144)
eGFR: 48 mL/min/{1.73_m2} — ABNORMAL LOW (ref >59)

## 2023-08-15 ENCOUNTER — Encounter: Payer: Self-pay | Admitting: Dermatology

## 2023-08-15 ENCOUNTER — Encounter: Payer: Self-pay | Admitting: Internal Medicine

## 2023-08-15 DIAGNOSIS — Z48812 Encounter for surgical aftercare following surgery on the circulatory system: Secondary | ICD-10-CM | POA: Diagnosis not present

## 2023-08-15 DIAGNOSIS — Z9889 Other specified postprocedural states: Secondary | ICD-10-CM

## 2023-08-15 NOTE — Progress Notes (Signed)
Daily Session Note  Patient Details  Name: [name] MRN: [id number] Date of Birth: 1955/08/25 Referring Provider:   Flowsheet Row Cardiac Rehab from 08/01/2023 in [facility] Cardiac and Pulmonary Rehab  Referring Provider Tally Joe, MD       Encounter Date: 08/15/2023  Check In:  Session Check In - 08/15/23 0930       Check-In   Supervising physician immediately available to respond to emergencies See telemetry face sheet for immediately available ER MD    Location [facility]-Cardiac & Pulmonary Rehab    Staff Present Kelton Pillar RN,BSN,MPA;Margaret Best, MS, Exercise Physiologist;Maxon Conetta BS, Exercise Physiologist;Joseph Reino Kent RCP,RRT,BSRT    Virtual Visit No    Medication changes reported     No    Fall or balance concerns reported    No    Warm-up and Cool-down Performed on first and last piece of equipment    Resistance Training Performed Yes    VAD Patient? No    PAD/SET Patient? No      Pain Assessment   Currently in Pain? No/denies                Social History   Tobacco Use  Smoking Status Former   Current packs/day: 0.00   Types: Cigarettes   Quit date: 02/13/1974   Years since quitting: 49.5  Smokeless Tobacco Never    Goals Met:  Independence with exercise equipment Exercise tolerated well No report of concerns or symptoms today Strength training completed today  Goals Unmet:  Not Applicable  Comments: Pt able to follow exercise prescription today without complaint.  Will continue to monitor for progression.    Dr. Bethann Punches is Medical Director for [facility] Cardiac Rehabilitation.  Dr. Vida Rigger is Medical Director for [facility] Pulmonary Rehabilitation.

## 2023-08-16 ENCOUNTER — Other Ambulatory Visit: Payer: Self-pay | Admitting: Dermatology

## 2023-08-16 DIAGNOSIS — L570 Actinic keratosis: Secondary | ICD-10-CM

## 2023-08-16 MED ORDER — IMIQUIMOD 5 % EX CREA: TOPICAL_CREAM | CUTANEOUS | 2 refills | Status: DC

## 2023-08-17 ENCOUNTER — Encounter: Payer: Medicare Other | Admitting: *Deleted

## 2023-08-17 DIAGNOSIS — Z9889 Other specified postprocedural states: Secondary | ICD-10-CM | POA: Diagnosis not present

## 2023-08-17 DIAGNOSIS — Z48812 Encounter for surgical aftercare following surgery on the circulatory system: Secondary | ICD-10-CM | POA: Diagnosis not present

## 2023-08-17 NOTE — Progress Notes (Signed)
Daily Session Note  Patient Details  Name: EMERIE VANDERKOLK MRN: [id number] Date of Birth: 01/27/1956 Referring Provider:   Flowsheet Row Cardiac Rehab from 08/01/2023 in [facility] Cardiac and Pulmonary Rehab  Referring Provider Tally Joe, MD       Encounter Date: 08/17/2023  Check In:  Session Check In - 08/17/23 0930       Check-In   Supervising physician immediately available to respond to emergencies See telemetry face sheet for immediately available ER MD    Location [facility]-Cardiac & Pulmonary Rehab    Staff Present Cora Collum, RN, BSN, CCRP;Noah Tickle, BS, Exercise Physiologist;Joseph Hood RCP,RRT,BSRT    Virtual Visit No    Medication changes reported     No    Fall or balance concerns reported    No    Warm-up and Cool-down Performed on first and last piece of equipment    Resistance Training Performed Yes    VAD Patient? No    PAD/SET Patient? No      Pain Assessment   Currently in Pain? No/denies                Social History   Tobacco Use  Smoking Status Former   Current packs/day: 0.00   Types: Cigarettes   Quit date: 02/13/1974   Years since quitting: 49.5  Smokeless Tobacco Never    Goals Met:  Independence with exercise equipment Exercise tolerated well No report of concerns or symptoms today  Goals Unmet:  Not Applicable  Comments: Pt able to follow exercise prescription today without complaint.  Will continue to monitor for progression.    Dr. Bethann Punches is Medical Director for [facility] Cardiac Rehabilitation.  Dr. Vida Rigger is Medical Director for [facility] Pulmonary Rehabilitation.

## 2023-08-20 ENCOUNTER — Ambulatory Visit (HOSPITAL_COMMUNITY): Payer: Medicare Other | Attending: Cardiology

## 2023-08-20 ENCOUNTER — Encounter: Payer: Medicare Other | Attending: Family Medicine

## 2023-08-20 DIAGNOSIS — Z48812 Encounter for surgical aftercare following surgery on the circulatory system: Secondary | ICD-10-CM | POA: Insufficient documentation

## 2023-08-20 DIAGNOSIS — Z952 Presence of prosthetic heart valve: Secondary | ICD-10-CM | POA: Diagnosis not present

## 2023-08-20 DIAGNOSIS — I10 Essential (primary) hypertension: Secondary | ICD-10-CM

## 2023-08-20 DIAGNOSIS — Z713 Dietary counseling and surveillance: Secondary | ICD-10-CM | POA: Insufficient documentation

## 2023-08-20 DIAGNOSIS — Z8679 Personal history of other diseases of the circulatory system: Secondary | ICD-10-CM | POA: Diagnosis not present

## 2023-08-20 DIAGNOSIS — G4733 Obstructive sleep apnea (adult) (pediatric): Secondary | ICD-10-CM

## 2023-08-20 DIAGNOSIS — Z9889 Other specified postprocedural states: Secondary | ICD-10-CM | POA: Diagnosis not present

## 2023-08-20 NOTE — Progress Notes (Signed)
Daily Session Note  Patient Details  Name: [name] MRN: [id number] Date of Birth: [birth year] Referring Provider:   Flowsheet Row Cardiac Rehab from 08/01/2023 in [facility] Cardiac and Pulmonary Rehab  Referring Provider Tally Joe, MD       Encounter Date: 08/20/2023  Check In:  Session Check In - 08/20/23 7829       Check-In   Supervising physician immediately available to respond to emergencies See telemetry face sheet for immediately available ER MD    Location [facility]-Cardiac & Pulmonary Rehab    Staff Present Kelton Pillar RN,BSN,MPA;Margaret Best, MS, Exercise Physiologist;Maxon Conetta BS, Exercise Physiologist;Myalynn Lingle Cloretta Ned, ACSM CEP, Exercise Physiologist    Virtual Visit No    Medication changes reported     No    Fall or balance concerns reported    No    Warm-up and Cool-down Performed on first and last piece of equipment    Resistance Training Performed Yes    VAD Patient? No    PAD/SET Patient? No      Pain Assessment   Currently in Pain? No/denies                Social History   Tobacco Use  Smoking Status Former   Current packs/day: 0.00   Types: Cigarettes   Quit date: 02/13/1974   Years since quitting: 49.5  Smokeless Tobacco Never    Goals Met:  Independence with exercise equipment Exercise tolerated well No report of concerns or symptoms today Strength training completed today  Goals Unmet:  Not Applicable  Comments: Pt able to follow exercise prescription today without complaint.  Will continue to monitor for progression.    Dr. Bethann Punches is Medical Director for [facility] Cardiac Rehabilitation.  Dr. Vida Rigger is Medical Director for [facility] Pulmonary Rehabilitation.

## 2023-08-21 LAB — ECHOCARDIOGRAM COMPLETE
AR max vel: 2.23 cm2
AV Area VTI: 2.2 cm2
AV Area mean vel: 2.11 cm2
AV Mean grad: 3 mm[Hg]
AV Peak grad: 5.6 mm[Hg]
Ao pk vel: 1.19 m/s
Area-P 1/2: 3.27 cm2
S' Lateral: 2.7 cm

## 2023-08-22 ENCOUNTER — Encounter: Payer: Medicare Other | Admitting: *Deleted

## 2023-08-22 DIAGNOSIS — Z713 Dietary counseling and surveillance: Secondary | ICD-10-CM | POA: Diagnosis not present

## 2023-08-22 DIAGNOSIS — Z9889 Other specified postprocedural states: Secondary | ICD-10-CM

## 2023-08-22 DIAGNOSIS — Z48812 Encounter for surgical aftercare following surgery on the circulatory system: Secondary | ICD-10-CM | POA: Diagnosis not present

## 2023-08-22 DIAGNOSIS — Z952 Presence of prosthetic heart valve: Secondary | ICD-10-CM | POA: Diagnosis not present

## 2023-08-22 NOTE — Progress Notes (Signed)
 Daily Session Note  Patient Details  Name: [name] MRN: [id number] Date of Birth: [birth year] Referring Provider:   Flowsheet Row Cardiac Rehab from 08/01/2023 in [facility] Cardiac and Pulmonary Rehab  Referring Provider Seabron Lenis, MD       Encounter Date: 08/22/2023  Check In:  Session Check In - 08/22/23 0941       Check-In   Supervising physician immediately available to respond to emergencies See telemetry face sheet for immediately available ER MD    Location [facility]-Cardiac & Pulmonary Rehab    Staff Present Othel Durand, RN, BSN, CCRP;Maxon Conetta BS, Exercise Physiologist;Joseph Hood RCP,RRT,BSRT    Virtual Visit No    Medication changes reported     No    Fall or balance concerns reported    No    Warm-up and Cool-down Performed on first and last piece of equipment    Resistance Training Performed Yes    VAD Patient? No    PAD/SET Patient? No      Pain Assessment   Currently in Pain? No/denies                Social History   Tobacco Use  Smoking Status Former   Current packs/day: 0.00   Types: Cigarettes   Quit date: 02/13/1974   Years since quitting: 49.5  Smokeless Tobacco Never    Goals Met:  Independence with exercise equipment Exercise tolerated well No report of concerns or symptoms today  Goals Unmet:  Not Applicable  Comments: Pt able to follow exercise prescription today without complaint.  Will continue to monitor for progression.    Dr. Oneil Pinal is Medical Director for [facility] Cardiac Rehabilitation.  Dr. Fuad Aleskerov is Medical Director for [facility] Pulmonary Rehabilitation.

## 2023-08-23 DIAGNOSIS — Z9889 Other specified postprocedural states: Secondary | ICD-10-CM | POA: Diagnosis not present

## 2023-08-23 DIAGNOSIS — Z952 Presence of prosthetic heart valve: Secondary | ICD-10-CM | POA: Diagnosis not present

## 2023-08-23 DIAGNOSIS — Z48812 Encounter for surgical aftercare following surgery on the circulatory system: Secondary | ICD-10-CM | POA: Diagnosis not present

## 2023-08-23 DIAGNOSIS — Z713 Dietary counseling and surveillance: Secondary | ICD-10-CM | POA: Diagnosis not present

## 2023-08-23 NOTE — Progress Notes (Signed)
 Daily Session Note  Patient Details  Name: ALIZIA GREIF MRN: [id number] Date of Birth: [birth year] Referring Provider:   Flowsheet Row Cardiac Rehab from 08/01/2023 in [facility] Cardiac and Pulmonary Rehab  Referring Provider Seabron Lenis, MD       Encounter Date: 08/23/2023  Check In:  Session Check In - 08/23/23 0933       Check-In   Supervising physician immediately available to respond to emergencies See telemetry face sheet for immediately available ER MD    Location [facility]-Cardiac & Pulmonary Rehab    Staff Present Othel Durand, RN, BSN, CCRP;Maxon Conetta BS, Exercise Physiologist;Joseph Hood RCP,RRT,BSRT;Noah Tickle, [place], Exercise Physiologist    Virtual Visit No    Medication changes reported     No    Fall or balance concerns reported    No    Warm-up and Cool-down Performed on first and last piece of equipment    Resistance Training Performed Yes    VAD Patient? No    PAD/SET Patient? No      Pain Assessment   Currently in Pain? No/denies                Social History   Tobacco Use  Smoking Status Former   Current packs/day: 0.00   Types: Cigarettes   Quit date: 02/13/1974   Years since quitting: 49.5  Smokeless Tobacco Never    Goals Met:  Independence with exercise equipment Exercise tolerated well No report of concerns or symptoms today  Goals Unmet:  Not Applicable  Comments: Pt able to follow exercise prescription today without complaint.  Will continue to monitor for progression.    Dr. Oneil Pinal is Medical Director for [facility] Cardiac Rehabilitation.  Dr. Fuad Aleskerov is Medical Director for [facility] Pulmonary Rehabilitation.

## 2023-08-25 ENCOUNTER — Encounter: Payer: Self-pay | Admitting: Internal Medicine

## 2023-08-25 DIAGNOSIS — I10 Essential (primary) hypertension: Secondary | ICD-10-CM

## 2023-08-27 ENCOUNTER — Encounter: Payer: Medicare Other | Admitting: *Deleted

## 2023-08-27 DIAGNOSIS — Z9889 Other specified postprocedural states: Secondary | ICD-10-CM

## 2023-08-27 DIAGNOSIS — Z48812 Encounter for surgical aftercare following surgery on the circulatory system: Secondary | ICD-10-CM | POA: Diagnosis not present

## 2023-08-27 DIAGNOSIS — Z713 Dietary counseling and surveillance: Secondary | ICD-10-CM | POA: Diagnosis not present

## 2023-08-27 DIAGNOSIS — Z952 Presence of prosthetic heart valve: Secondary | ICD-10-CM | POA: Diagnosis not present

## 2023-08-27 NOTE — Progress Notes (Signed)
 Daily Session Note  Patient Details  Name: [name] MRN: [id number] Date of Birth: 1955/12/01 Referring Provider:   Flowsheet Row Cardiac Rehab from 08/01/2023 in [facility] Cardiac and Pulmonary Rehab  Referring Provider Rae Bugler, MD       Encounter Date: 08/27/2023  Check In:  Session Check In - 08/27/23 0930       Check-In   Supervising physician immediately available to respond to emergencies See telemetry face sheet for immediately available ER MD    Location [facility]-Cardiac & Pulmonary Rehab    Staff Present Maud Sorenson, RN, BSN, CCRP;Margaret Best, MS, Exercise Physiologist;Kelly Sabra Cramp BS, ACSM CEP, Exercise Physiologist;Maxon Conetta BS, Exercise Physiologist    Virtual Visit No    Medication changes reported     No    Fall or balance concerns reported    No    Warm-up and Cool-down Performed on first and last piece of equipment    Resistance Training Performed Yes    VAD Patient? No    PAD/SET Patient? No      Pain Assessment   Currently in Pain? No/denies                Social History   Tobacco Use  Smoking Status Former   Current packs/day: 0.00   Types: Cigarettes   Quit date: 02/13/1974   Years since quitting: 49.5  Smokeless Tobacco Never    Goals Met:  Independence with exercise equipment Exercise tolerated well No report of concerns or symptoms today  Goals Unmet:  Not Applicable  Comments: Pt able to follow exercise prescription today without complaint.  Will continue to monitor for progression.    Dr. Firman Hughes is Medical Director for [facility] Cardiac Rehabilitation.  Dr. Fuad Aleskerov is Medical Director for [facility] Pulmonary Rehabilitation.

## 2023-08-27 NOTE — Progress Notes (Signed)
 Assessment start time: 10:21 AM  Digestive issues/concerns: no known food allergies   24-hours Recall: B: smoothie OR yogurt L: leftover from dinner - chicken salad croissant  D: chicken marsala   Beverages Water (~48oz), orange juice and cranberry juice  Education r/t nutrition plan Patient drinking ~48oz of water. Some juice in small quantities. Rarely a tea or soda. She usually eats 3 times per day, but often eating out or getting pickup/delivery. Reviewed mediterranean diet handout, spoke about salt and sodium limits of below 1500mg  daily. Reviewed ways to cut back and stay below the limit most days. Educated on types of fats, sources, and how to read labels. Reviewed healthy plate sample handout and discussed the importance of balanced meals with both protein and complex carbs in controlled portions and colorful produce to stay full.    Goal 1: Eat 15-30gProtein and 30-60gCarbs at each meal. Goal 2: Read labels and reduce sodium intake to below 2300mg . Ideally 1500mg  per day.  Goal 3: Reduce saturated fat, less than 12g per day. Replace bad fats for more heart healthy fats.   End time 10:53 AM

## 2023-08-28 MED ORDER — CARVEDILOL 12.5 MG PO TABS: 12.5000 mg | ORAL_TABLET | Freq: Two times a day (BID) | ORAL | 3 refills | Status: DC

## 2023-08-29 ENCOUNTER — Encounter: Payer: Medicare Other | Admitting: *Deleted

## 2023-08-29 ENCOUNTER — Encounter: Payer: Self-pay | Admitting: *Deleted

## 2023-08-29 DIAGNOSIS — Z48812 Encounter for surgical aftercare following surgery on the circulatory system: Secondary | ICD-10-CM | POA: Diagnosis not present

## 2023-08-29 DIAGNOSIS — Z713 Dietary counseling and surveillance: Secondary | ICD-10-CM | POA: Diagnosis not present

## 2023-08-29 DIAGNOSIS — Z9889 Other specified postprocedural states: Secondary | ICD-10-CM | POA: Diagnosis not present

## 2023-08-29 DIAGNOSIS — Z952 Presence of prosthetic heart valve: Secondary | ICD-10-CM | POA: Diagnosis not present

## 2023-08-29 NOTE — Progress Notes (Signed)
Cardiac Individual Treatment Plan  Patient Details  Name: NYAJAH HYSON MRN: [id number] Date of Birth: [birth year] Referring Provider:   Flowsheet Row Cardiac Rehab from 08/01/2023 in [facility] Cardiac and Pulmonary Rehab  Referring Provider Tally Joe, MD       Initial Encounter Date:  Flowsheet Row Cardiac Rehab from 08/01/2023 in [facility] Cardiac and Pulmonary Rehab  Date 08/01/23       Visit Diagnosis: S/P aortic valve repair  S/P aortic dissection repair  Patient's Home Medications on Admission:  Current Outpatient Medications:    acetaminophen (TYLENOL) 500 MG tablet, Take 500 mg by mouth every 6 (six) hours as needed., Disp: , Rfl:    calcipotriene (DOVONOX) 0.005 % cream, Apply to aa BID x 7 days, Disp: 60 g, Rfl: 0   calcium carbonate (OSCAL) 1500 (600 Ca) MG TABS tablet, Take 600 mg of elemental calcium by mouth daily., Disp: , Rfl:    carvedilol (COREG) 12.5 MG tablet, Take 1 tablet (12.5 mg total) by mouth 2 (two) times daily., Disp: 180 tablet, Rfl: 3   Cholecalciferol (VITAMIN D3 SUPER STRENGTH) 50 MCG (2000 UT) CAPS, Take by mouth daily at 6 (six) AM., Disp: , Rfl:    cholestyramine light (PREVALITE) 4 g packet, Take 4 g by mouth 2 (two) times daily., Disp: , Rfl:    clobetasol cream (TEMOVATE) 0.05 %, Apply twice daily up to 2 weeks to affected area on left thigh and left ankle. Avoid applying to face, groin, and axilla., Disp: 30 g, Rfl: 1   Glucosamine Sulfate 500 MG TABS, Take by mouth as needed (joint pain)., Disp: , Rfl:    imiquimod (ALDARA) 5 % cream, Apply to precancers twice per week at bedtime. If no reaction occurs after a month, increase to 5 nights per week., Disp: 24 each, Rfl: 2   losartan (COZAAR) 100 MG tablet, Take 1 tablet (100 mg total) by mouth daily., Disp: 90 tablet, Rfl: 3   Niacin (VITAMIN B-3 PO), Take 1 tablet by mouth daily., Disp: , Rfl:    NON FORMULARY, Pt uses a cpap nightly, Disp: , Rfl:    POTASSIUM PO, Take 1 tablet by mouth daily.,  Disp: , Rfl:    triamterene-hydrochlorothiazide (MAXZIDE) 75-50 MG tablet, Take 1 tablet by mouth daily., Disp: , Rfl:   Past Medical History: Past Medical History:  Diagnosis Date   Actinic keratosis    Arthritis    Diverticulosis    Dysplastic nevus 09/27/2009   Left post waistline. Mild atypia. Limited margins free.   History of gallstones    History of hiatal hernia    Hx of basal cell carcinoma 06/18/2012   L bra line infrascapular   Hx of squamous cell carcinoma of skin    multiple sites   Hypertension    Peptic ulcer disease 1991   Sleep apnea    cpap   Squamous cell carcinoma in situ (SCCIS) 04/26/2022   left abdomen ED&C 07/03/22   Squamous cell carcinoma of skin 05/07/2007   Right medial superior breast. SCCis   Squamous cell carcinoma of skin 06/09/2013   Right medial calf. WD SCC. Recurrent 07/28/2013. Tx: EDC   Squamous cell carcinoma of skin 01/13/2014   Left medial pretibial. SCC, KA pattern   Squamous cell carcinoma of skin 09/06/2016   Left medial lower leg. WD SCC   Squamous cell carcinoma of skin 11/07/2017   Right distal pretibial. WD SCC. Tx: EDC   Squamous cell carcinoma of skin 07/03/2022  Left pretibia. EDC    Tobacco Use: Social History   Tobacco Use  Smoking Status Former   Current packs/day: 0.00   Types: Cigarettes   Quit date: 02/13/1974   Years since quitting: 49.5  Smokeless Tobacco Never    Labs: Review Flowsheet       Latest Ref Rng & Units 05/22/2023  Labs for ITP Cardiac and Pulmonary Rehab  TCO2 22 - 32 mmol/L 27      Exercise Target Goals: Exercise Program Goal: Individual exercise prescription set using results from initial 6 min walk test and THRR while considering  patient's activity barriers and safety.   Exercise Prescription Goal: Initial exercise prescription builds to 30-45 minutes a day of aerobic activity, 2-3 days per week.  Home exercise guidelines will be given to patient during program as part of exercise  prescription that the participant will acknowledge.   Education: Aerobic Exercise: - Group verbal and visual presentation on the components of exercise prescription. Introduces F.I.T.T principle from ACSM for exercise prescriptions.  Reviews F.I.T.T. principles of aerobic exercise including progression. Written material given at graduation.   Education: Resistance Exercise: - Group verbal and visual presentation on the components of exercise prescription. Introduces F.I.T.T principle from ACSM for exercise prescriptions  Reviews F.I.T.T. principles of resistance exercise including progression. Written material given at graduation.    Education: Exercise & Equipment Safety: - Individual verbal instruction and demonstration of equipment use and safety with use of the equipment. Flowsheet Row Cardiac Rehab from 08/15/2023 in [facility] Cardiac and Pulmonary Rehab  Date 08/01/23  Educator MB  Instruction Review Code 1- Verbalizes Understanding       Education: Exercise Physiology & General Exercise Guidelines: - Group verbal and written instruction with models to review the exercise physiology of the cardiovascular system and associated critical values. Provides general exercise guidelines with specific guidelines to those with heart or lung disease.    Education: Flexibility, Balance, Mind/Body Relaxation: - Group verbal and visual presentation with interactive activity on the components of exercise prescription. Introduces F.I.T.T principle from ACSM for exercise prescriptions. Reviews F.I.T.T. principles of flexibility and balance exercise training including progression. Also discusses the mind body connection.  Reviews various relaxation techniques to help reduce and manage stress (i.e. Deep breathing, progressive muscle relaxation, and visualization). Balance handout provided to take home. Written material given at graduation.   Activity Barriers & Risk Stratification:  Activity Barriers &  Cardiac Risk Stratification - 08/01/23 1049       Activity Barriers & Cardiac Risk Stratification   Activity Barriers Joint Problems   Bilateral hip pain   Cardiac Risk Stratification Low             6 Minute Walk:  6 Minute Walk     Row Name 08/01/23 1048         6 Minute Walk   Phase Initial     Distance 1085 feet     Walk Time 6 minutes     # of Rest Breaks 0     MPH 2.05     METS 2.69     RPE 12     Perceived Dyspnea  1     VO2 Peak 9.41     Symptoms Yes (comment)     Comments Bilateral hip pain 7/10 at end of test     Resting HR 76 bpm     Resting BP 138/84     Resting Oxygen Saturation  98 %     Exercise  Oxygen Saturation  during 6 min walk 96 %     Max Ex. HR 81 bpm     Max Ex. BP 144/80     2 Minute Post BP 130/80              Oxygen Initial Assessment:   Oxygen Re-Evaluation:   Oxygen Discharge (Final Oxygen Re-Evaluation):   Initial Exercise Prescription:  Initial Exercise Prescription - 08/01/23 1000       Date of Initial Exercise RX and Referring Provider   Date 08/01/23    Referring Provider Tally Joe, MD      Oxygen   Maintain Oxygen Saturation 88% or higher      Treadmill   MPH 2    Grade 0    Minutes 15    METs 2.53      Recumbant Bike   Level 2    RPM 50    Watts 15    Minutes 15    METs 2.69      NuStep   Level 2    SPM 80    Minutes 15    METs 2.69      REL-XR   Level 1    Speed 50    Minutes 15    METs 2.69      T5 Nustep   Level 2    SPM 80    Minutes 15    METs 2.69      Track   Laps 30    Minutes 15    METs 2.63      Prescription Details   Frequency (times per week) 3    Duration Progress to 30 minutes of continuous aerobic without signs/symptoms of physical distress      Intensity   THRR 40-80% of Max Heartrate 103-136    Ratings of Perceived Exertion 11-13    Perceived Dyspnea 0-4      Progression   Progression Continue to progress workloads to maintain intensity without  signs/symptoms of physical distress.      Resistance Training   Training Prescription Yes    Weight 3 lb    Reps 10-15             Perform Capillary Blood Glucose checks as needed.  Exercise Prescription Changes:   Exercise Prescription Changes     Row Name 08/01/23 1000 08/13/23 1700           Response to Exercise   Blood Pressure (Admit) 138/84 120/82      Blood Pressure (Exercise) 144/80 128/80      Blood Pressure (Exit) 130/80 114/72      Heart Rate (Admit) 76 bpm 78 bpm      Heart Rate (Exercise) 81 bpm 95 bpm      Heart Rate (Exit) 70 bpm 83 bpm      Oxygen Saturation (Admit) 98 % --      Oxygen Saturation (Exercise) 96 % --      Oxygen Saturation (Exit) 96 % --      Rating of Perceived Exertion (Exercise) 12 13      Perceived Dyspnea (Exercise) 1 --      Symptoms Bilateral hip pain 7/10 none      Comments Results first full day of exercise      Duration Progress to 30 minutes of  aerobic without signs/symptoms of physical distress Progress to 30 minutes of  aerobic without signs/symptoms of physical distress      Intensity THRR New  THRR unchanged        Progression   Progression Continue to progress workloads to maintain intensity without signs/symptoms of physical distress. Continue to progress workloads to maintain intensity without signs/symptoms of physical distress.      Average METs 2.69 1.99        Resistance Training   Training Prescription -- Yes      Weight -- 3 lb      Reps -- 10-15        Interval Training   Interval Training -- No        T5 Nustep   Level -- 1      Minutes -- 15      METs -- 2.1        Track   Laps -- 16      Minutes -- 15      METs -- 1.87               Exercise Comments:   Exercise Comments     Row Name 08/06/23 0908           Exercise Comments First full day of exercise!  Patient was oriented to gym and equipment including functions, settings, policies, and procedures.  Patient's individual  exercise prescription and treatment plan were reviewed.  All starting workloads were established based on the results of the 6 minute walk test done at initial orientation visit.  The plan for exercise progression was also introduced and progression will be customized based on patient's performance and goals.                Exercise Goals and Review:   Exercise Goals     Row Name 08/01/23 1053             Exercise Goals   Increase Physical Activity Yes       Intervention Provide advice, education, support and counseling about physical activity/exercise needs.;Develop an individualized exercise prescription for aerobic and resistive training based on initial evaluation findings, risk stratification, comorbidities and participant's personal goals.       Expected Outcomes Short Term: Attend rehab on a regular basis to increase amount of physical activity.;Long Term: Add in home exercise to make exercise part of routine and to increase amount of physical activity.;Long Term: Exercising regularly at least 3-5 days a week.       Increase Strength and Stamina Yes       Intervention Provide advice, education, support and counseling about physical activity/exercise needs.;Develop an individualized exercise prescription for aerobic and resistive training based on initial evaluation findings, risk stratification, comorbidities and participant's personal goals.       Expected Outcomes Short Term: Increase workloads from initial exercise prescription for resistance, speed, and METs.;Short Term: Perform resistance training exercises routinely during rehab and add in resistance training at home;Long Term: Improve cardiorespiratory fitness, muscular endurance and strength as measured by increased METs and functional capacity ( )       Able to understand and use rate of perceived exertion (RPE) scale Yes       Intervention Provide education and explanation on how to use RPE scale       Expected Outcomes  Short Term: Able to use RPE daily in rehab to express subjective intensity level;Long Term:  Able to use RPE to guide intensity level when exercising independently       Able to understand and use Dyspnea scale Yes       Intervention Provide education and explanation on  how to use Dyspnea scale       Expected Outcomes Short Term: Able to use Dyspnea scale daily in rehab to express subjective sense of shortness of breath during exertion;Long Term: Able to use Dyspnea scale to guide intensity level when exercising independently       Knowledge and understanding of Target Heart Rate Range (THRR) Yes       Intervention Provide education and explanation of THRR including how the numbers were predicted and where they are located for reference       Expected Outcomes Short Term: Able to state/look up THRR;Short Term: Able to use daily as guideline for intensity in rehab;Long Term: Able to use THRR to govern intensity when exercising independently       Able to check pulse independently Yes       Intervention Provide education and demonstration on how to check pulse in carotid and radial arteries.;Review the importance of being able to check your own pulse for safety during independent exercise       Expected Outcomes Short Term: Able to explain why pulse checking is important during independent exercise;Long Term: Able to check pulse independently and accurately       Understanding of Exercise Prescription Yes       Intervention Provide education, explanation, and written materials on patient's individual exercise prescription       Expected Outcomes Short Term: Able to explain program exercise prescription;Long Term: Able to explain home exercise prescription to exercise independently                Exercise Goals Re-Evaluation :  Exercise Goals Re-Evaluation     Row Name 08/06/23 0913 08/13/23 1722 08/23/23 0943         Exercise Goal Re-Evaluation   Exercise Goals Review Increase Physical  Activity;Able to understand and use rate of perceived exertion (RPE) scale;Knowledge and understanding of Target Heart Rate Range (THRR);Understanding of Exercise Prescription;Increase Strength and Stamina;Able to check pulse independently Increase Physical Activity;Understanding of Exercise Prescription;Increase Strength and Stamina Increase Physical Activity;Increase Strength and Stamina;Understanding of Exercise Prescription     Comments Reviewed RPE and dyspnea scale, THR and program prescription with pt today.  Pt voiced understanding and was given a copy of goals to take home. Eddy completed her first exercise session. She was able to complete 16 laps on the track and work at level 1 on the T5 nustep. We will continue to monitor her progress in the program. Matisyn is doing well here at rehab, on treadmill today at speed of 3. She reports that her hips hurt after ~74mins of walking but if she stops for a bit, the pain goes away. Reports that coming to rehab has helped her walk longer before the onset of pain. Encouraged her to contineu to come to rehab and work on strength and stamina. Reminded her to comminicate with EP and rehab team if pain increases or last longer than usual.     Expected Outcomes Short: Use RPE daily to regulate intensity.  Long: Follow program prescription in THR. Short: Continue to follow current exercise prescription. Long: Continue exercise to improve strength and stamina. STG: increase workload as able without hurting hips. LTG: continue to exercise to improve strength and stamina              Discharge Exercise Prescription (Final Exercise Prescription Changes):  Exercise Prescription Changes - 08/13/23 1700       Response to Exercise   Blood Pressure (Admit) 120/82  Blood Pressure (Exercise) 128/80    Blood Pressure (Exit) 114/72    Heart Rate (Admit) 78 bpm    Heart Rate (Exercise) 95 bpm    Heart Rate (Exit) 83 bpm    Rating of Perceived Exertion (Exercise)  13    Symptoms none    Comments first full day of exercise    Duration Progress to 30 minutes of  aerobic without signs/symptoms of physical distress    Intensity THRR unchanged      Progression   Progression Continue to progress workloads to maintain intensity without signs/symptoms of physical distress.    Average METs 1.99      Resistance Training   Training Prescription Yes    Weight 3 lb    Reps 10-15      Interval Training   Interval Training No      T5 Nustep   Level 1    Minutes 15    METs 2.1      Track   Laps 16    Minutes 15    METs 1.87             Nutrition:  Target Goals: Understanding of nutrition guidelines, daily intake of sodium 1500mg , cholesterol 200mg , calories 30% from fat and 7% or less from saturated fats, daily to have 5 or more servings of fruits and vegetables.  Education: All About Nutrition: -Group instruction provided by verbal, written material, interactive activities, discussions, models, and posters to present general guidelines for heart healthy nutrition including fat, fiber, MyPlate, the role of sodium in heart healthy nutrition, utilization of the nutrition label, and utilization of this knowledge for meal planning. Follow up email sent as well. Written material given at graduation. Flowsheet Row Cardiac Rehab from 08/15/2023 in [facility] Cardiac and Pulmonary Rehab  Education need identified 08/01/23       Biometrics:  Pre Biometrics - 08/01/23 1054       Pre Biometrics   Height 5' 4.5" (1.638 m)    Weight 145 lb 11.2 oz (66.1 kg)    Waist Circumference 37.5 inches    Hip Circumference 39.5 inches    Waist to Hip Ratio 0.95 %    BMI (Calculated) 24.63    Single Leg Stand 30 seconds              Nutrition Therapy Plan and Nutrition Goals:  Nutrition Therapy & Goals - 08/27/23 1419       Nutrition Therapy   Diet Cardiac, Low Na    Protein (specify units) 75-90    Fiber 25 grams    Whole Grain Foods 3 servings     Saturated Fats 15 max. grams    Fruits and Vegetables 5 servings/day    Sodium 2 grams      Personal Nutrition Goals   Nutrition Goal Eat 15-30gProtein and 30-60gCarbs at each meal.    Personal Goal #2 Read labels and reduce sodium intake to below 2300mg . Ideally 1500mg  per day.    Personal Goal #3 Reduce saturated fat, less than 12g per day. Replace bad fats for more heart healthy fats.    Comments Patient drinking ~48oz of water. Some juice in small quantities. Rarely a tea or soda. She usually eats 3 times per day, but often eating out or getting pickup/delivery. Reviewed mediterranean diet handout, spoke about salt and sodium limits of below 1500mg  daily. Reviewed ways to cut back and stay below the limit most days. Educated on types of fats, sources, and how to  read labels. Reviewed healthy plate sample handout and discussed the importance of balanced meals with both protein and complex carbs in controlled portions and colorful produce to stay full.      Intervention Plan   Intervention Prescribe, educate and counsel regarding individualized specific dietary modifications aiming towards targeted core components such as weight, hypertension, lipid management, diabetes, heart failure and other comorbidities.;Nutrition handout(s) given to patient.    Expected Outcomes Short Term Goal: Understand basic principles of dietary content, such as calories, fat, sodium, cholesterol and nutrients.;Short Term Goal: A plan has been developed with personal nutrition goals set during dietitian appointment.;Long Term Goal: Adherence to prescribed nutrition plan.             Nutrition Assessments:  MEDIFICTS Score Key: >=70 Need to make dietary changes  40-70 Heart Healthy Diet <= 40 Therapeutic Level Cholesterol Diet  Flowsheet Row Cardiac Rehab from 08/01/2023 in [facility] Cardiac and Pulmonary Rehab  Picture Your Plate Total Score on Admission 64      Picture Your Plate Scores: <60 Unhealthy dietary  pattern with much room for improvement. 41-50 Dietary pattern unlikely to meet recommendations for good health and room for improvement. 51-60 More healthful dietary pattern, with some room for improvement.  >60 Healthy dietary pattern, although there may be some specific behaviors that could be improved.    Nutrition Goals Re-Evaluation:  Nutrition Goals Re-Evaluation     Row Name 08/23/23 0935             Goals   Comment She reports she is doing well with eating fruits but feels she could do better with eating more veggies. Commended her on including fruit and colorful produce in her diet. Spoke about her improving appetite and priortizing quality over quantity. Will do full assessment next week       Expected Outcome STG: Include more veggies and attend RD appointment next week. LTG: follow heart healthy lifestyle                Nutrition Goals Discharge (Final Nutrition Goals Re-Evaluation):  Nutrition Goals Re-Evaluation - 08/23/23 0935       Goals   Comment She reports she is doing well with eating fruits but feels she could do better with eating more veggies. Commended her on including fruit and colorful produce in her diet. Spoke about her improving appetite and priortizing quality over quantity. Will do full assessment next week    Expected Outcome STG: Include more veggies and attend RD appointment next week. LTG: follow heart healthy lifestyle             Psychosocial: Target Goals: Acknowledge presence or absence of significant depression and/or stress, maximize coping skills, provide positive support system. Participant is able to verbalize types and ability to use techniques and skills needed for reducing stress and depression.   Education: Stress, Anxiety, and Depression - Group verbal and visual presentation to define topics covered.  Reviews how body is impacted by stress, anxiety, and depression.  Also discusses healthy ways to reduce stress and to  treat/manage anxiety and depression.  Written material given at graduation.   Education: Sleep Hygiene -Provides group verbal and written instruction about how sleep can affect your health.  Define sleep hygiene, discuss sleep cycles and impact of sleep habits. Review good sleep hygiene tips.    Initial Review & Psychosocial Screening:  Initial Psych Review & Screening - 06/18/23 0944       Initial Review   Current issues  with None Identified      Family Dynamics   Good Support System? Yes   husband  Thayer Ohm     Barriers   Psychosocial barriers to participate in program There are no identifiable barriers or psychosocial needs.      Screening Interventions   Interventions Encouraged to exercise;To provide support and resources with identified psychosocial needs;Provide feedback about the scores to participant    Expected Outcomes Short Term goal: Utilizing psychosocial counselor, staff and physician to assist with identification of specific Stressors or current issues interfering with healing process. Setting desired goal for each stressor or current issue identified.;Long Term Goal: Stressors or current issues are controlled or eliminated.;Short Term goal: Identification and review with participant of any Quality of Life or Depression concerns found by scoring the questionnaire.;Long Term goal: The participant improves quality of Life and PHQ9 Scores as seen by post scores and/or verbalization of changes             Quality of Life Scores:   Quality of Life - 08/01/23 1054       Quality of Life   Select Quality of Life      Quality of Life Scores   Health/Function Pre 22.5 %    Socioeconomic Pre 24.69 %    Psych/Spiritual Pre 25.36 %    Family Pre 30 %    GLOBAL Pre 24.64 %            Scores of 19 and below usually indicate a poorer quality of life in these areas.  A difference of  2-3 points is a clinically meaningful difference.  A difference of 2-3 points in the  total score of the Quality of Life Index has been associated with significant improvement in overall quality of life, self-image, physical symptoms, and general health in studies assessing change in quality of life.  PHQ-9: Review Flowsheet       08/01/2023  Depression screen PHQ 2/9  Decreased Interest 0  Down, Depressed, Hopeless 0  PHQ - 2 Score 0  Altered sleeping 1  Tired, decreased energy 1  Change in appetite 0  Feeling bad or failure about yourself  0  Trouble concentrating 0  Moving slowly or fidgety/restless 0  Suicidal thoughts 0  PHQ-9 Score 2  Difficult doing work/chores Not difficult at all   Interpretation of Total Score  Total Score Depression Severity:  1-4 = Minimal depression, 5-9 = Mild depression, 10-14 = Moderate depression, 15-19 = Moderately severe depression, 20-27 = Severe depression   Psychosocial Evaluation and Intervention:  Psychosocial Evaluation - 06/18/23 0956       Psychosocial Evaluation & Interventions   Interventions Encouraged to exercise with the program and follow exercise prescription    Comments Hartleigh has no barriers to attending the program. She lives with her husband and he is her support person.  She is ready to start the program after her home PT.    Expected Outcomes STG attends all scheduled sessions  LTG utilizes education and resources obtained during the program to contineu her healthy lifestyle steps.    Continue Psychosocial Services  Follow up required by staff             Psychosocial Re-Evaluation:  Psychosocial Re-Evaluation     Row Name 08/23/23 0940             Psychosocial Re-Evaluation   Current issues with None Identified       Comments Roschelle reports no depression, anxiety or stress.  Says she has a good support system with her husband, friends and church. She says she wakes up a few times in the middle of the night but gets restful sleep and wakes up well rested most mornings.       Expected Outcomes  STG: continue to attend rehab and use support systems as needed. LTG: maintain positive outlook on health and daily life       Interventions Encouraged to attend Cardiac Rehabilitation for the exercise       Continue Psychosocial Services  Follow up required by staff                Psychosocial Discharge (Final Psychosocial Re-Evaluation):  Psychosocial Re-Evaluation - 08/23/23 0940       Psychosocial Re-Evaluation   Current issues with None Identified    Comments Mckynleigh reports no depression, anxiety or stress. Says she has a good support system with her husband, friends and church. She says she wakes up a few times in the middle of the night but gets restful sleep and wakes up well rested most mornings.    Expected Outcomes STG: continue to attend rehab and use support systems as needed. LTG: maintain positive outlook on health and daily life    Interventions Encouraged to attend Cardiac Rehabilitation for the exercise    Continue Psychosocial Services  Follow up required by staff             Vocational Rehabilitation: Provide vocational rehab assistance to qualifying candidates.   Vocational Rehab Evaluation & Intervention:  Vocational Rehab - 06/18/23 0946       Initial Vocational Rehab Evaluation & Intervention   Assessment shows need for Vocational Rehabilitation No      Vocational Rehab Re-Evaulation   Comments retired             Education: Education Goals: Education classes will be provided on a variety of topics geared toward better understanding of heart health and risk factor modification. Participant will state understanding/return demonstration of topics presented as noted by education test scores.  Learning Barriers/Preferences:  Learning Barriers/Preferences - 06/18/23 0945       Learning Barriers/Preferences   Learning Barriers Hearing   wears hearing aid   Learning Preferences None             General Cardiac Education  Topics:  AED/CPR: - Group verbal and written instruction with the use of models to demonstrate the basic use of the AED with the basic ABC's of resuscitation.   Anatomy and Cardiac Procedures: - Group verbal and visual presentation and models provide information about basic cardiac anatomy and function. Reviews the testing methods done to diagnose heart disease and the outcomes of the test results. Describes the treatment choices: Medical Management, Angioplasty, or Coronary Bypass Surgery for treating various heart conditions including Myocardial Infarction, Angina, Valve Disease, and Cardiac Arrhythmias.  Written material given at graduation. Flowsheet Row Cardiac Rehab from 08/15/2023 in [facility] Cardiac and Pulmonary Rehab  Date 08/15/23  Educator sb  Instruction Review Code 1- Verbalizes Understanding       Medication Safety: - Group verbal and visual instruction to review commonly prescribed medications for heart and lung disease. Reviews the medication, class of the drug, and side effects. Includes the steps to properly store meds and maintain the prescription regimen.  Written material given at graduation.   Intimacy: - Group verbal instruction through game format to discuss how heart and lung disease can affect sexual intimacy. Written material given at  graduation..   Know Your Numbers and Heart Failure: - Group verbal and visual instruction to discuss disease risk factors for cardiac and pulmonary disease and treatment options.  Reviews associated critical values for Overweight/Obesity, Hypertension, Cholesterol, and Diabetes.  Discusses basics of heart failure: signs/symptoms and treatments.  Introduces Heart Failure Zone chart for action plan for heart failure.  Written material given at graduation.   Infection Prevention: - Provides verbal and written material to individual with discussion of infection control including proper hand washing and proper equipment cleaning during  exercise session. Flowsheet Row Cardiac Rehab from 08/15/2023 in [facility] Cardiac and Pulmonary Rehab  Date 08/01/23  Educator MB  Instruction Review Code 1- Verbalizes Understanding       Falls Prevention: - Provides verbal and written material to individual with discussion of falls prevention and safety. Flowsheet Row Cardiac Rehab from 08/15/2023 in [facility] Cardiac and Pulmonary Rehab  Date 08/01/23  Educator MB  Instruction Review Code 1- Verbalizes Understanding       Other: -Provides group and verbal instruction on various topics (see comments)   Knowledge Questionnaire Score:  Knowledge Questionnaire Score - 08/01/23 1059       Knowledge Questionnaire Score   Pre Score 25/26             Core Components/Risk Factors/Patient Goals at Admission:  Personal Goals and Risk Factors at Admission - 08/01/23 1059       Core Components/Risk Factors/Patient Goals on Admission    Weight Management Yes;Weight Maintenance    Intervention Weight Management: Develop a combined nutrition and exercise program designed to reach desired caloric intake, while maintaining appropriate intake of nutrient and fiber, sodium and fats, and appropriate energy expenditure required for the weight goal.;Weight Management: Provide education and appropriate resources to help participant work on and attain dietary goals.    Admit Weight 145 lb 11.2 oz (66.1 kg)    Goal Weight: Short Term 145 lb (65.8 kg)    Goal Weight: Long Term 145 lb (65.8 kg)    Expected Outcomes Short Term: Continue to assess and modify interventions until short term weight is achieved;Long Term: Adherence to nutrition and physical activity/exercise program aimed toward attainment of established weight goal;Weight Maintenance: Understanding of the daily nutrition guidelines, which includes 25-35% calories from fat, 7% or less cal from saturated fats, less than 200mg  cholesterol, less than 1.5gm of sodium, & 5 or more servings of fruits  and vegetables daily;Understanding recommendations for meals to include 15-35% energy as protein, 25-35% energy from fat, 35-60% energy from carbohydrates, less than 200mg  of dietary cholesterol, 20-35 gm of total fiber daily;Understanding of distribution of calorie intake throughout the day with the consumption of 4-5 meals/snacks    Hypertension Yes    Intervention Provide education on lifestyle modifcations including regular physical activity/exercise, weight management, moderate sodium restriction and increased consumption of fresh fruit, vegetables, and low fat dairy, alcohol moderation, and smoking cessation.;Monitor prescription use compliance.    Expected Outcomes Short Term: Continued assessment and intervention until BP is < 140/31mm HG in hypertensive participants. < 130/73mm HG in hypertensive participants with diabetes, heart failure or chronic kidney disease.;Long Term: Maintenance of blood pressure at goal levels.             Education:Diabetes - Individual verbal and written instruction to review signs/symptoms of diabetes, desired ranges of glucose level fasting, after meals and with exercise. Acknowledge that pre and post exercise glucose checks will be done for 3 sessions at entry of program.  Core Components/Risk Factors/Patient Goals Review:   Goals and Risk Factor Review     Row Name 08/23/23 0931             Core Components/Risk Factors/Patient Goals Review   Personal Goals Review Weight Management/Obesity       Review She reports she lost her appetite after her surgery and lost ~15lbs. But she has since regained her appetite and stablized her weight. Spoke about ensuring her body is strong regardless of the number on the scale. She agrees and will continue to attend rehab to improve strength and stamina.       Expected Outcomes STG: attend rehab and meet nutrtional needs to maintain weight. LTG: achieve and maintain healthy weight                Core  Components/Risk Factors/Patient Goals at Discharge (Final Review):   Goals and Risk Factor Review - 08/23/23 0931       Core Components/Risk Factors/Patient Goals Review   Personal Goals Review Weight Management/Obesity    Review She reports she lost her appetite after her surgery and lost ~15lbs. But she has since regained her appetite and stablized her weight. Spoke about ensuring her body is strong regardless of the number on the scale. She agrees and will continue to attend rehab to improve strength and stamina.    Expected Outcomes STG: attend rehab and meet nutrtional needs to maintain weight. LTG: achieve and maintain healthy weight             ITP Comments:  ITP Comments     Row Name 06/18/23 0954 08/01/23 1047 08/06/23 0907 08/29/23 1140     ITP Comments Virtual orientation call completed today. shehas an appointment on Date: will schedule for Jan after [facility] PT completed  for EP eval and gym Orientation.  Documentation of diagnosis can be found in [facility] Date: 05/22/2023 . Completed and gym orientation. Initial ITP created and sent for review to Dr. Bethann Punches, Medical Director. First full day of exercise!  Patient was oriented to gym and equipment including functions, settings, policies, and procedures.  Patient's individual exercise prescription and treatment plan were reviewed.  All starting workloads were established based on the results of the 6 minute walk test done at initial orientation visit.  The plan for exercise progression was also introduced and progression will be customized based on patient's performance and goals. 30 Day review completed. Medical Director ITP review done, changes made as directed, and signed approval by Medical Director.    new to program             Comments:

## 2023-08-29 NOTE — Progress Notes (Signed)
Daily Session Note  Patient Details  Name: ANGELLICA MADDISON MRN: [id number] Date of Birth: 12-24-1955 Referring Provider:   Flowsheet Row Cardiac Rehab from 08/01/2023 in [facility] Cardiac and Pulmonary Rehab  Referring Provider Tally Joe, MD       Encounter Date: 08/29/2023  Check In:  Session Check In - 08/29/23 1318       Check-In   Supervising physician immediately available to respond to emergencies See telemetry face sheet for immediately available ER MD    Location [facility]-Cardiac & Pulmonary Rehab    Staff Present Rory Percy, MS, Exercise Physiologist;Maxon Suzzette Righter, Exercise Physiologist;Joseph Hood RCP,RRT,BSRT;Meredith Craven RN,BSN;Theophile Harvie, RN, BSN, CCRP    Virtual Visit No    Medication changes reported     No    Fall or balance concerns reported    No    Warm-up and Cool-down Performed on first and last piece of equipment    Resistance Training Performed Yes    VAD Patient? No    PAD/SET Patient? No      Pain Assessment   Currently in Pain? No/denies                Social History   Tobacco Use  Smoking Status Former   Current packs/day: 0.00   Types: Cigarettes   Quit date: 02/13/1974   Years since quitting: 49.5  Smokeless Tobacco Never    Goals Met:  Independence with exercise equipment Exercise tolerated well No report of concerns or symptoms today  Goals Unmet:  Not Applicable  Comments: Pt able to follow exercise prescription today without complaint.  Will continue to monitor for progression.    Dr. Bethann Punches is Medical Director for [facility] Cardiac Rehabilitation.  Dr. Vida Rigger is Medical Director for [facility] Pulmonary Rehabilitation.

## 2023-08-30 ENCOUNTER — Encounter: Payer: Medicare Other | Admitting: *Deleted

## 2023-08-30 DIAGNOSIS — Z48812 Encounter for surgical aftercare following surgery on the circulatory system: Secondary | ICD-10-CM | POA: Diagnosis not present

## 2023-08-30 DIAGNOSIS — Z952 Presence of prosthetic heart valve: Secondary | ICD-10-CM | POA: Diagnosis not present

## 2023-08-30 DIAGNOSIS — Z9889 Other specified postprocedural states: Secondary | ICD-10-CM

## 2023-08-30 DIAGNOSIS — Z713 Dietary counseling and surveillance: Secondary | ICD-10-CM | POA: Diagnosis not present

## 2023-08-30 NOTE — Progress Notes (Signed)
Daily Session Note  Patient Details  Name: SERAPHINA MITCHNER MRN: [id number] Date of Birth: [birth year] Referring Provider:   Flowsheet Row Cardiac Rehab from 08/01/2023 in [facility] Cardiac and Pulmonary Rehab  Referring Provider Tally Joe, MD       Encounter Date: 08/30/2023  Check In:  Session Check In - 08/30/23 0928       Check-In   Supervising physician immediately available to respond to emergencies See telemetry face sheet for immediately available ER MD    Location [facility]-Cardiac & Pulmonary Rehab    Staff Present Cora Collum, RN, BSN, CCRP;Maxon Conetta BS, Exercise Physiologist;Joseph Hood RCP,RRT,BSRT;Noah Tickle, [place], Exercise Physiologist    Virtual Visit No    Medication changes reported     No    Fall or balance concerns reported    No    Warm-up and Cool-down Performed on first and last piece of equipment    Resistance Training Performed Yes    VAD Patient? No    PAD/SET Patient? No      Pain Assessment   Currently in Pain? No/denies                Social History   Tobacco Use  Smoking Status Former   Current packs/day: 0.00   Types: Cigarettes   Quit date: 02/13/1974   Years since quitting: 49.5  Smokeless Tobacco Never    Goals Met:  Independence with exercise equipment Exercise tolerated well No report of concerns or symptoms today  Goals Unmet:  Not Applicable  Comments: Pt able to follow exercise prescription today without complaint.  Will continue to monitor for progression.    Dr. Bethann Punches is Medical Director for [facility] Cardiac Rehabilitation.  Dr. Vida Rigger is Medical Director for [facility] Pulmonary Rehabilitation.

## 2023-09-04 ENCOUNTER — Ambulatory Visit: Payer: Medicare Other | Admitting: Dermatology

## 2023-09-13 ENCOUNTER — Ambulatory Visit: Payer: Medicare Other | Attending: Cardiovascular Disease | Admitting: Pharmacist

## 2023-09-13 VITALS — BP 118/80 | HR 67

## 2023-09-13 DIAGNOSIS — I10 Essential (primary) hypertension: Secondary | ICD-10-CM | POA: Diagnosis not present

## 2023-09-13 NOTE — Progress Notes (Signed)
 Patient ID: [name]                 DOB: 1955/10/13                      MRN: [id number]      HPI: [name] is a 68 y.o. female referred by Dr. Izora Ribas to HTN clinic. PMH is significant for type A aortic dissection, hypertension, and obstructive sleep apnea.   surgery for the aortic dissection on May 21, 2024. Post surgery patient was put on losartan 50 mg daily. At office visit with Dr.Chandrasekhar on 08/02/23 BP was 147/89 losartan dose was increased to 100 mg daily. On 08/25/23 metoprolol was changed to carvedilol 12.5 mg twice daily for better BP control.  Pt presented today for BP follow up. Reports she feels fine except her B Pat home varies a lot. She gets dizzy only when she moves around fat doing her household chores. She denies for SOB at rest, chest pain, swelling or headaches. She gets mild SOB when she is climbing stairs or walking fast pace  She has 2 BP monitor at home - Wrist one and arm cuff one. Wrist one is easy to use so she uses that. She brought in her home BP readings they varies a lot with ~~120-125/75-85 range highest 146/85 and lowest 112/68 . Heart rate remains in 65-70 range. We validated wrist monitor today found out to be inaccurate. She states she has been on Maxzide for years. She is thinking it may have stopped working for her.   1st on home  wrist machine - 112/69 heart rate 66  1st on office cuff 120/80 heart rate 67  2nd on home wrist machine - 126/75 heart rate 67  2nd on office cuff 118/80 heart rate 67   Current HTN meds: carvedilol 12.5 mg twice daily, losartan 100 mg daily, Maxzide 75-50 mg daily   Previously tried: metoprolol 50 mg twice daily  BP goal: <130/80  Social History:  Alcohol: 1 std drink per week  Smoking: quit at age of 52  Diet: eat out a lot - 5 dinners per week  Suggest to cut down on eating out - cook wit very little or no salt at home   Exercise: cardio rehab- 3 times a week    Home BP readings: it  varies ~120-125/75-85 range highest 146/85 and lowest 112/68    Wt Readings from Last 3 Encounters:  08/02/23 143 lb (64.9 kg)  08/01/23 145 lb 11.2 oz (66.1 kg)  05/22/23 158 lb (71.7 kg)   BP Readings from Last 3 Encounters:  09/13/23 118/80  08/02/23 (!) 147/89  05/22/23 104/71   Pulse Readings from Last 3 Encounters:  09/13/23 67  08/02/23 69  05/22/23 (!) 59    Renal function: CrCl cannot be calculated (Patient's most recent lab result is older than the maximum 21 days allowed.).  Past Medical History:  Diagnosis Date   Actinic keratosis    Arthritis    Diverticulosis    Dysplastic nevus 09/27/2009   Left post waistline. Mild atypia. Limited margins free.   History of gallstones    History of hiatal hernia    Hx of basal cell carcinoma 06/18/2012   L bra line infrascapular   Hx of squamous cell carcinoma of skin    multiple sites   Hypertension    Peptic ulcer disease 1991   Sleep apnea    cpap   Squamous cell  carcinoma in situ (SCCIS) 04/26/2022   left abdomen ED&C 07/03/22   Squamous cell carcinoma of skin 05/07/2007   Right medial superior breast. SCCis   Squamous cell carcinoma of skin 06/09/2013   Right medial calf. WD SCC. Recurrent 07/28/2013. Tx: EDC   Squamous cell carcinoma of skin 01/13/2014   Left medial pretibial. SCC, KA pattern   Squamous cell carcinoma of skin 09/06/2016   Left medial lower leg. WD SCC   Squamous cell carcinoma of skin 11/07/2017   Right distal pretibial. WD SCC. Tx: EDC   Squamous cell carcinoma of skin 07/03/2022   Left pretibia. EDC    Current Outpatient Medications on File Prior to Visit  Medication Sig Dispense Refill   acetaminophen (TYLENOL) 500 MG tablet Take 500 mg by mouth every 6 (six) hours as needed.     aspirin EC 81 MG tablet Take 81 mg by mouth daily. Swallow whole.     calcipotriene (DOVONOX) 0.005 % cream Apply to aa BID x 7 days 60 g 0   calcium carbonate (OSCAL) 1500 (600 Ca) MG TABS tablet Take 600  mg of elemental calcium by mouth daily.     carvedilol (COREG) 12.5 MG tablet Take 1 tablet (12.5 mg total) by mouth 2 (two) times daily. 180 tablet 3   Cholecalciferol (VITAMIN D3 SUPER STRENGTH) 50 MCG (2000 UT) CAPS Take by mouth daily at 6 (six) AM.     cholestyramine light (PREVALITE) 4 g packet Take 4 g by mouth 2 (two) times daily.     clobetasol cream (TEMOVATE) 0.05 % Apply twice daily up to 2 weeks to affected area on left thigh and left ankle. Avoid applying to face, groin, and axilla. 30 g 1   Glucosamine Sulfate 500 MG TABS Take by mouth as needed (joint pain).     imiquimod (ALDARA) 5 % cream Apply to precancers twice per week at bedtime. If no reaction occurs after a month, increase to 5 nights per week. 24 each 2   losartan (COZAAR) 100 MG tablet Take 1 tablet (100 mg total) by mouth daily. 90 tablet 3   Niacin (VITAMIN B-3 PO) Take 1 tablet by mouth daily.     NON FORMULARY Pt uses a cpap nightly     POTASSIUM PO Take 2 tablets by mouth daily.     triamterene-hydrochlorothiazide (MAXZIDE) 75-50 MG tablet Take 1 tablet by mouth daily.     No current facility-administered medications on file prior to visit.    Allergies  Allergen Reactions   Levaquin [Levofloxacin]     Aortic dissection hx   Vicodin [Hydrocodone-Acetaminophen] Itching    Blood pressure 118/80, pulse 67, SpO2 99%.   Assessment/Plan:  1. Hypertension -  Hypertension Assessment: BP is controlled in office BP 118/80  mmHg heart rate 67 (goal<130/80) Takes and tolerates current BP medications well  Home wrist cuff validated- found out to be inaccurate  Home BP varies  ~120-125/75-85 range highest 146/85 and lowest 112/68  Correct steps for home BP measurement discussed in details   Dizziness only when moves fast - position change - mild SOB with extortion -climbing stairs and brisk walking   Denies SOB at rest, palpitation, chest pain, headaches,or swelling Diet needs improvement- will cut down on  eating out  Cardiac rehab- 3 times per week   Plan:  No medication changes as BP at goal  Continue taking carvedilol 12.5 mg twice daily, losartan 100 mg daily, Maxzide 75-50 mg daily  Patient to keep record  of BP readings with heart rate and report to [place] at the next visit Patient to bring home arm cuff for validation at next OV  Patient to see PharmD in 6  weeks for follow up  Follow up lab(s) : BMP today post losartan dose increment SrCr was slightly elevated     Thank you  Carmela Hurt, Pharm.D  [facility] [address] [place], [place] [address]  Phone: [phone]; Fax: [phone]

## 2023-09-13 NOTE — Assessment & Plan Note (Signed)
 Assessment: BP is controlled in office BP 118/80  mmHg heart rate 67 (goal<130/80) Takes and tolerates current BP medications well  Home wrist cuff validated- found out to be inaccurate  Home BP varies  ~120-125/75-85 range highest 146/85 and lowest 112/68  Correct steps for home BP measurement discussed in details   Dizziness only when moves fast - position change - mild SOB with extortion -climbing stairs and brisk walking   Denies SOB at rest, palpitation, chest pain, headaches,or swelling Diet needs improvement- will cut down on eating out  Cardiac rehab- 3 times per week   Plan:  No medication changes as BP at goal  Continue taking carvedilol 12.5 mg twice daily, losartan 100 mg daily, Maxzide 75-50 mg daily  Patient to keep record of BP readings with heart rate and report to [place] at the next visit Patient to bring home arm cuff for validation at next OV  Patient to see PharmD in 6  weeks for follow up  Follow up lab(s) : BMP today post losartan dose increment SrCr was slightly elevated

## 2023-09-13 NOTE — Patient Instructions (Addendum)
 No changes to medications  made by your pharmacist Carmela Hurt, PharmD at today's visit:     Bring all of your meds, your BP cuff and your record of home blood pressures to your next appointment.    HOW TO TAKE YOUR BLOOD PRESSURE AT HOME  Rest 5 minutes before taking your blood pressure.  Don't smoke or drink caffeinated beverages for at least 30 minutes before. Take your blood pressure before (not after) you eat. Sit comfortably with your back supported and both feet on the floor (don't cross your legs). Elevate your arm to heart level on a table or a desk. Use the proper sized cuff. It should fit smoothly and snugly around your bare upper arm. There should be enough room to slip a fingertip under the cuff. The bottom edge of the cuff should be 1 inch above the crease of the elbow. Ideally, take 3 measurements at one sitting and record the average.  Important lifestyle changes to control high blood pressure  Intervention  Effect on the BP  Lose extra pounds and watch your waistline Weight loss is one of the most effective lifestyle changes for controlling blood pressure. If you're overweight or obese, losing even a small amount of weight can help reduce blood pressure. Blood pressure might go down by about 1 millimeter of mercury (mm Hg) with each kilogram (about 2.2 pounds) of weight lost.  Exercise regularly As a general goal, aim for at least 30 minutes of moderate physical activity every day. Regular physical activity can lower high blood pressure by about 5 to 8 mm Hg.  Eat a healthy diet Eating a diet rich in whole grains, fruits, vegetables, and low-fat dairy products and low in saturated fat and cholesterol. A healthy diet can lower high blood pressure by up to 11 mm Hg.  Reduce salt (sodium) in your diet Even a small reduction of sodium in the diet can improve heart health and reduce high blood pressure by about 5 to 6 mm Hg.  Limit alcohol One drink equals 12 ounces of beer,  5 ounces of wine, or 1.5 ounces of 80-proof liquor.  Limiting alcohol to less than one drink a day for women or two drinks a day for men can help lower blood pressure by about 4 mm Hg.   If you have any questions or concerns please use My Chart to send questions or call the office at [phone]

## 2023-09-14 ENCOUNTER — Encounter: Payer: Medicare Other | Admitting: *Deleted

## 2023-09-14 DIAGNOSIS — Z952 Presence of prosthetic heart valve: Secondary | ICD-10-CM | POA: Diagnosis not present

## 2023-09-14 DIAGNOSIS — Z48812 Encounter for surgical aftercare following surgery on the circulatory system: Secondary | ICD-10-CM | POA: Diagnosis not present

## 2023-09-14 DIAGNOSIS — Z9889 Other specified postprocedural states: Secondary | ICD-10-CM | POA: Diagnosis not present

## 2023-09-14 DIAGNOSIS — Z713 Dietary counseling and surveillance: Secondary | ICD-10-CM | POA: Diagnosis not present

## 2023-09-14 LAB — BASIC METABOLIC PANEL
BUN/Creatinine Ratio: 15 (ref 12–28)
BUN: 25 mg/dL (ref 8–27)
CO2: 22 mmol/L (ref 20–29)
Calcium: 9.9 mg/dL (ref 8.7–10.3)
Chloride: 102 mmol/L (ref 96–106)
Creatinine, Ser: 1.67 mg/dL — ABNORMAL HIGH (ref 0.57–1.00)
Glucose: 87 mg/dL (ref 70–99)
Potassium: 3.6 mmol/L (ref 3.5–5.2)
Sodium: 142 mmol/L (ref 134–144)
eGFR: 33 mL/min/{1.73_m2} — ABNORMAL LOW (ref >59)

## 2023-09-14 NOTE — Progress Notes (Signed)
 Daily Session Note  Patient Details  Name: AVO SCHLACHTER MRN: [id number] Date of Birth: [birth year] Referring Provider:   Flowsheet Row Cardiac Rehab from 08/01/2023 in [facility] Cardiac and Pulmonary Rehab  Referring Provider Tally Joe, MD       Encounter Date: 09/14/2023  Check In:  Session Check In - 09/14/23 0919       Check-In   Supervising physician immediately available to respond to emergencies See telemetry face sheet for immediately available ER MD    Location [facility]-Cardiac & Pulmonary Rehab    Staff Present Susann Givens RN,BSN;Joseph Weyman Pedro, [place], Exercise Physiologist    Virtual Visit No    Medication changes reported     No    Warm-up and Cool-down Performed on first and last piece of equipment    Resistance Training Performed Yes    VAD Patient? No    PAD/SET Patient? No      Pain Assessment   Currently in Pain? No/denies                Social History   Tobacco Use  Smoking Status Former   Current packs/day: 0.00   Types: Cigarettes   Quit date: 02/13/1974   Years since quitting: 49.6  Smokeless Tobacco Never    Goals Met:  Independence with exercise equipment Exercise tolerated well No report of concerns or symptoms today Strength training completed today  Goals Unmet:  Not Applicable  Comments: Pt able to follow exercise prescription today without complaint.  Will continue to monitor for progression.    Dr. Bethann Punches is Medical Director for [facility] Cardiac Rehabilitation.  Dr. Vida Rigger is Medical Director for [facility] Pulmonary Rehabilitation.

## 2023-09-17 ENCOUNTER — Encounter: Payer: Medicare Other | Attending: Family Medicine | Admitting: *Deleted

## 2023-09-17 ENCOUNTER — Other Ambulatory Visit: Payer: Self-pay | Admitting: Pharmacist

## 2023-09-17 ENCOUNTER — Ambulatory Visit
Admission: RE | Admit: 2023-09-17 | Discharge: 2023-09-17 | Disposition: A | Discharge status: Home / Self Care | Payer: Medicare Other | Source: Ambulatory Visit | Attending: Family Medicine | Admitting: Family Medicine

## 2023-09-17 DIAGNOSIS — Z9889 Other specified postprocedural states: Secondary | ICD-10-CM | POA: Insufficient documentation

## 2023-09-17 DIAGNOSIS — Z48812 Encounter for surgical aftercare following surgery on the circulatory system: Secondary | ICD-10-CM | POA: Diagnosis not present

## 2023-09-17 DIAGNOSIS — Z1231 Encounter for screening mammogram for malignant neoplasm of breast: Secondary | ICD-10-CM

## 2023-09-17 DIAGNOSIS — I10 Essential (primary) hypertension: Secondary | ICD-10-CM

## 2023-09-17 MED ORDER — AMLODIPINE BESYLATE 5 MG PO TABS: 5.0000 mg | ORAL_TABLET | Freq: Every day | ORAL | 3 refills | Status: DC

## 2023-09-17 NOTE — Progress Notes (Signed)
 Daily Session Note  Patient Details  Name: [name] MRN: [id number] Date of Birth: [birth year] Referring Provider:   Flowsheet Row Cardiac Rehab from 08/01/2023 in [facility] Cardiac and Pulmonary Rehab  Referring Provider Tally Joe, MD       Encounter Date: 09/17/2023  Check In:  Session Check In - 09/17/23 1538       Check-In   Supervising physician immediately available to respond to emergencies See telemetry face sheet for immediately available ER MD    Location [facility]-Cardiac & Pulmonary Rehab    Staff Present Elige Ko RCP,RRT,BSRT;Tarquin Welcher Jewel Baize RN,BSN;Denise Rhew PhD, RN,CNS,CEN    Virtual Visit No    Medication changes reported     No    Fall or balance concerns reported    No    Warm-up and Cool-down Performed on first and last piece of equipment    Resistance Training Performed Yes    VAD Patient? No    PAD/SET Patient? No      Pain Assessment   Currently in Pain? No/denies                Social History   Tobacco Use  Smoking Status Former   Current packs/day: 0.00   Types: Cigarettes   Quit date: 02/13/1974   Years since quitting: 49.6  Smokeless Tobacco Never    Goals Met:  Independence with exercise equipment Exercise tolerated well No report of concerns or symptoms today Strength training completed today  Goals Unmet:  Not Applicable  Comments: Pt able to follow exercise prescription today without complaint.  Will continue to monitor for progression.    Dr. Bethann Punches is Medical Director for [facility] Cardiac Rehabilitation.  Dr. Vida Rigger is Medical Director for [facility] Pulmonary Rehabilitation.

## 2023-09-19 ENCOUNTER — Encounter: Payer: Medicare Other | Admitting: *Deleted

## 2023-09-19 DIAGNOSIS — Z9889 Other specified postprocedural states: Secondary | ICD-10-CM

## 2023-09-19 DIAGNOSIS — H35342 Macular cyst, hole, or pseudohole, left eye: Secondary | ICD-10-CM | POA: Diagnosis not present

## 2023-09-19 DIAGNOSIS — Z48812 Encounter for surgical aftercare following surgery on the circulatory system: Secondary | ICD-10-CM | POA: Diagnosis not present

## 2023-09-19 DIAGNOSIS — H5203 Hypermetropia, bilateral: Secondary | ICD-10-CM | POA: Diagnosis not present

## 2023-09-19 NOTE — Progress Notes (Signed)
 Daily Session Note  Patient Details  Name: [name] MRN: [id number] Date of Birth: 1956/02/26 Referring Provider:   Flowsheet Row Cardiac Rehab from 08/01/2023 in [facility] Cardiac and Pulmonary Rehab  Referring Provider Tally Joe, MD       Encounter Date: 09/19/2023  Check In:  Session Check In - 09/19/23 0928       Check-In   Supervising physician immediately available to respond to emergencies See telemetry face sheet for immediately available ER MD    Location [facility]-Cardiac & Pulmonary Rehab    Staff Present Cora Collum, RN, BSN, CCRP;Joseph Hood RCP,RRT,BSRT;Noah Tickle, [place], Exercise Physiologist    Virtual Visit No    Medication changes reported     No    Fall or balance concerns reported    No    Warm-up and Cool-down Performed on first and last piece of equipment    Resistance Training Performed Yes    VAD Patient? No    PAD/SET Patient? No      Pain Assessment   Currently in Pain? No/denies                Social History   Tobacco Use  Smoking Status Former   Current packs/day: 0.00   Types: Cigarettes   Quit date: 02/13/1974   Years since quitting: 49.6  Smokeless Tobacco Never    Goals Met:  Independence with exercise equipment Exercise tolerated well No report of concerns or symptoms today  Goals Unmet:  Not Applicable  Comments: Pt able to follow exercise prescription today without complaint.  Will continue to monitor for progression. Med change   losartan stopped   started Norvasc   Dr. Bethann Punches is Medical Director for [facility] Cardiac Rehabilitation.  Dr. Vida Rigger is Medical Director for [facility] Pulmonary Rehabilitation.

## 2023-09-20 ENCOUNTER — Other Ambulatory Visit (HOSPITAL_COMMUNITY): Payer: Self-pay

## 2023-09-20 ENCOUNTER — Other Ambulatory Visit: Payer: Self-pay

## 2023-09-20 ENCOUNTER — Telehealth: Payer: Self-pay | Admitting: *Deleted

## 2023-09-20 MED ORDER — AMOXICILLIN 500 MG PO CAPS
2000.0000 mg | ORAL_CAPSULE | Freq: Once | ORAL | 1 refills | Status: AC
  Filled 2023-09-20 – 2023-09-21 (×2): qty 4, 1d supply, fill #0

## 2023-09-20 NOTE — Telephone Encounter (Signed)
   Pre-operative Risk Assessment    Patient Name: [name]  DOB: 01-16-1956 MRN: [id number]   Date of last office visit: 08/02/23 Date of next office visit: 12/18/23  Request for Surgical Clearance    Procedure:   GENERAL CLEANING   Date of Surgery:  Clearance TBD                                Surgeon:   Surgeon's Group or Practice Name:  [facility] Phone number:  [phone] Fax number:  [phone]   Type of Clearance Requested:   - Medical    THEY WANT TO KNOW IF PT NEEDS ABX FOR PRE-MED   Type of Anesthesia:  Not Indicated   Additional requests/questions:    [name]   09/20/2023, 3:49 PM

## 2023-09-20 NOTE — Telephone Encounter (Signed)
   Patient Name: [name]  DOB: 11-16-1955 MRN: [id number]  Primary Cardiologist: None  Chart reviewed as part of pre-operative protocol coverage.    Simple dental extractions (i.e. 1-2 teeth) are considered low risk procedures per guidelines and generally do not require any specific cardiac clearance. It is also generally accepted that for simple extractions and dental cleanings, there is no need to interrupt blood thinner therapy.  SBE prophylaxis is required for the patient from a cardiac standpoint. -Patient will need amoxicillin 2000 mg to be taken 30-60 minutes prior to dental procedure. -Prescription has been sent to pharmacy on file for amoxicillin.   I will route this recommendation to the requesting party via Epic fax function and remove from pre-op pool.  Please call with questions.  Napoleon Form, Leodis Rains, NP 09/20/2023, 3:55 PM

## 2023-09-21 ENCOUNTER — Other Ambulatory Visit: Payer: Self-pay

## 2023-09-21 ENCOUNTER — Other Ambulatory Visit (HOSPITAL_COMMUNITY): Payer: Self-pay

## 2023-09-21 DIAGNOSIS — Z9889 Other specified postprocedural states: Secondary | ICD-10-CM

## 2023-09-21 DIAGNOSIS — Z48812 Encounter for surgical aftercare following surgery on the circulatory system: Secondary | ICD-10-CM | POA: Diagnosis not present

## 2023-09-21 NOTE — Progress Notes (Signed)
 Daily Session Note  Patient Details  Name: CINDIE RAJAGOPALAN MRN: [id number] Date of Birth: Aug 24, 1955 Referring Provider:   Flowsheet Row Cardiac Rehab from 08/01/2023 in [facility] Cardiac and Pulmonary Rehab  Referring Provider Tally Joe, MD       Encounter Date: 09/21/2023  Check In:  Session Check In - 09/21/23 0907       Check-In   Supervising physician immediately available to respond to emergencies See telemetry face sheet for immediately available ER MD    Location [facility]-Cardiac & Pulmonary Rehab    Staff Present Kelton Pillar RN,BSN,MPA;Noah Tickle, BS, Exercise Physiologist;Joseph Reino Kent RCP,RRT,BSRT    Virtual Visit No    Medication changes reported     Yes    Comments discontinued losartan; started amlodipine    Fall or balance concerns reported    No    Warm-up and Cool-down Performed on first and last piece of equipment    Resistance Training Performed Yes    VAD Patient? No    PAD/SET Patient? No      Pain Assessment   Currently in Pain? No/denies                Social History   Tobacco Use  Smoking Status Former   Current packs/day: 0.00   Types: Cigarettes   Quit date: 02/13/1974   Years since quitting: 49.6  Smokeless Tobacco Never    Goals Met:  Independence with exercise equipment Exercise tolerated well No report of concerns or symptoms today Strength training completed today  Goals Unmet:  Not Applicable  Comments: Pt able to follow exercise prescription today without complaint.  Will continue to monitor for progression.    Dr. Bethann Punches is Medical Director for [facility] Cardiac Rehabilitation.  Dr. Vida Rigger is Medical Director for [facility] Pulmonary Rehabilitation.

## 2023-09-24 ENCOUNTER — Encounter: Payer: Medicare Other | Admitting: *Deleted

## 2023-09-24 DIAGNOSIS — Z9889 Other specified postprocedural states: Secondary | ICD-10-CM | POA: Diagnosis not present

## 2023-09-24 DIAGNOSIS — Z48812 Encounter for surgical aftercare following surgery on the circulatory system: Secondary | ICD-10-CM | POA: Diagnosis not present

## 2023-09-24 NOTE — Progress Notes (Signed)
 Daily Session Note  Patient Details  Name: [name] MRN: [id number] Date of Birth: [birth year] Referring Provider:   Flowsheet Row Cardiac Rehab from 08/01/2023 in [facility] Cardiac and Pulmonary Rehab  Referring Provider Tally Joe, MD       Encounter Date: 09/24/2023  Check In:  Session Check In - 09/24/23 0934       Check-In   Supervising physician immediately available to respond to emergencies See telemetry face sheet for immediately available ER MD    Location [facility]-Cardiac & Pulmonary Rehab    Staff Present Cora Collum, RN, BSN, CCRP;Margaret Best, MS, Exercise Physiologist;Kelly Cloretta Ned, ACSM CEP, Exercise Physiologist    Virtual Visit No    Medication changes reported     No    Fall or balance concerns reported    No    Warm-up and Cool-down Performed on first and last piece of equipment    Resistance Training Performed Yes    VAD Patient? No    PAD/SET Patient? No      Pain Assessment   Currently in Pain? No/denies                Social History   Tobacco Use  Smoking Status Former   Current packs/day: 0.00   Types: Cigarettes   Quit date: 02/13/1974   Years since quitting: 49.6  Smokeless Tobacco Never    Goals Met:  Independence with exercise equipment Exercise tolerated well No report of concerns or symptoms today  Goals Unmet:  Not Applicable  Comments: Pt able to follow exercise prescription today without complaint.  Will continue to monitor for progression.    Dr. Bethann Punches is Medical Director for [facility] Cardiac Rehabilitation.  Dr. Vida Rigger is Medical Director for [facility] Pulmonary Rehabilitation.

## 2023-09-25 MED ORDER — AMOXICILLIN 500 MG PO TABS: ORAL_TABLET | ORAL | 0 refills | Status: AC

## 2023-09-25 MED ORDER — AMOXICILLIN 500 MG PO TABS: ORAL_TABLET | ORAL | 0 refills | Status: DC

## 2023-09-25 NOTE — Addendum Note (Signed)
 Addended by: Macie Burows on: 09/25/2023 08:44 AM   Modules accepted: Orders

## 2023-09-26 ENCOUNTER — Encounter: Payer: Medicare Other | Admitting: *Deleted

## 2023-09-26 DIAGNOSIS — Z9889 Other specified postprocedural states: Secondary | ICD-10-CM | POA: Diagnosis not present

## 2023-09-26 DIAGNOSIS — Z48812 Encounter for surgical aftercare following surgery on the circulatory system: Secondary | ICD-10-CM | POA: Diagnosis not present

## 2023-09-26 NOTE — Progress Notes (Signed)
 Cardiac Individual Treatment Plan  Patient Details  Name: [name] MRN: [id number] Date of Birth: 1956-04-12 Referring Provider:   Flowsheet Row Cardiac Rehab from 08/01/2023 in [facility] Cardiac and Pulmonary Rehab  Referring Provider Tally Joe, MD       Initial Encounter Date:  Flowsheet Row Cardiac Rehab from 08/01/2023 in [facility] Cardiac and Pulmonary Rehab  Date 08/01/23       Visit Diagnosis: S/P aortic valve repair  S/P aortic dissection repair  Patient's Home Medications on Admission:  Current Outpatient Medications:    amLODipine (NORVASC) 5 MG tablet, Take 1 tablet (5 mg total) by mouth daily., Disp: 180 tablet, Rfl: 3   amoxicillin (AMOXIL) 500 MG tablet, Take 30 min-1 hour prior to all dental work, Disp: 4 tablet, Rfl: 0   aspirin EC 81 MG tablet, Take 81 mg by mouth daily. Swallow whole., Disp: , Rfl:    calcium carbonate (OSCAL) 1500 (600 Ca) MG TABS tablet, Take 600 mg of elemental calcium by mouth daily., Disp: , Rfl:    carvedilol (COREG) 12.5 MG tablet, Take 1 tablet (12.5 mg total) by mouth 2 (two) times daily., Disp: 180 tablet, Rfl: 3   Cholecalciferol (VITAMIN D3 SUPER STRENGTH) 50 MCG (2000 UT) CAPS, Take by mouth daily at 6 (six) AM., Disp: , Rfl:    cholestyramine light (PREVALITE) 4 g packet, Take 4 g by mouth 2 (two) times daily., Disp: , Rfl:    Glucosamine Sulfate 500 MG TABS, Take by mouth as needed (joint pain)., Disp: , Rfl:    Niacin (VITAMIN B-3 PO), Take 1 tablet by mouth daily., Disp: , Rfl:    NON FORMULARY, Pt uses a cpap nightly, Disp: , Rfl:    POTASSIUM PO, Take 2 tablets by mouth daily., Disp: , Rfl:    triamterene-hydrochlorothiazide (MAXZIDE) 75-50 MG tablet, Take 1 tablet by mouth daily., Disp: , Rfl:   Past Medical History: Past Medical History:  Diagnosis Date   Actinic keratosis    Arthritis    Diverticulosis    Dysplastic nevus 09/27/2009   Left post waistline. Mild atypia. Limited margins free.   History of gallstones     History of hiatal hernia    Hx of basal cell carcinoma 06/18/2012   L bra line infrascapular   Hx of squamous cell carcinoma of skin    multiple sites   Hypertension    Peptic ulcer disease 1991   Sleep apnea    cpap   Squamous cell carcinoma in situ (SCCIS) 04/26/2022   left abdomen ED&C 07/03/22   Squamous cell carcinoma of skin 05/07/2007   Right medial superior breast. SCCis   Squamous cell carcinoma of skin 06/09/2013   Right medial calf. WD SCC. Recurrent 07/28/2013. Tx: EDC   Squamous cell carcinoma of skin 01/13/2014   Left medial pretibial. SCC, KA pattern   Squamous cell carcinoma of skin 09/06/2016   Left medial lower leg. WD SCC   Squamous cell carcinoma of skin 11/07/2017   Right distal pretibial. WD SCC. Tx: EDC   Squamous cell carcinoma of skin 07/03/2022   Left pretibia. EDC    Tobacco Use: Social History   Tobacco Use  Smoking Status Former   Current packs/day: 0.00   Types: Cigarettes   Quit date: 02/13/1974   Years since quitting: 49.6  Smokeless Tobacco Never    Labs: Review Flowsheet       Latest Ref Rng & Units 05/22/2023  Labs for ITP Cardiac and Pulmonary Rehab  TCO2 22 - 32 mmol/L 27      Exercise Target Goals: Exercise Program Goal: Individual exercise prescription set using results from initial 6 min walk test and THRR while considering  patient's activity barriers and safety.   Exercise Prescription Goal: Initial exercise prescription builds to 30-45 minutes a day of aerobic activity, 2-3 days per week.  Home exercise guidelines will be given to patient during program as part of exercise prescription that the participant will acknowledge.   Education: Aerobic Exercise: - Group verbal and visual presentation on the components of exercise prescription. Introduces F.I.T.T principle from ACSM for exercise prescriptions.  Reviews F.I.T.T. principles of aerobic exercise including progression. Written material given at  graduation.   Education: Resistance Exercise: - Group verbal and visual presentation on the components of exercise prescription. Introduces F.I.T.T principle from ACSM for exercise prescriptions  Reviews F.I.T.T. principles of resistance exercise including progression. Written material given at graduation.    Education: Exercise & Equipment Safety: - Individual verbal instruction and demonstration of equipment use and safety with use of the equipment. Flowsheet Row Cardiac Rehab from 09/26/2023 in [facility] Cardiac and Pulmonary Rehab  Date 08/01/23  Educator MB  Instruction Review Code 1- Verbalizes Understanding       Education: Exercise Physiology & General Exercise Guidelines: - Group verbal and written instruction with models to review the exercise physiology of the cardiovascular system and associated critical values. Provides general exercise guidelines with specific guidelines to those with heart or lung disease.    Education: Flexibility, Balance, Mind/Body Relaxation: - Group verbal and visual presentation with interactive activity on the components of exercise prescription. Introduces F.I.T.T principle from ACSM for exercise prescriptions. Reviews F.I.T.T. principles of flexibility and balance exercise training including progression. Also discusses the mind body connection.  Reviews various relaxation techniques to help reduce and manage stress (i.e. Deep breathing, progressive muscle relaxation, and visualization). Balance handout provided to take home. Written material given at graduation. Flowsheet Row Cardiac Rehab from 09/26/2023 in [facility] Cardiac and Pulmonary Rehab  Date 09/26/23  Educator NT  Instruction Review Code 1- Verbalizes Understanding       Activity Barriers & Risk Stratification:  Activity Barriers & Cardiac Risk Stratification - 08/01/23 1049       Activity Barriers & Cardiac Risk Stratification   Activity Barriers Joint Problems   Bilateral hip pain    Cardiac Risk Stratification Low             6 Minute Walk:  6 Minute Walk     Row Name 08/01/23 1048         6 Minute Walk   Phase Initial     Distance 1085 feet     Walk Time 6 minutes     # of Rest Breaks 0     MPH 2.05     METS 2.69     RPE 12     Perceived Dyspnea  1     VO2 Peak 9.41     Symptoms Yes (comment)     Comments Bilateral hip pain 7/10 at end of test     Resting HR 76 bpm     Resting BP 138/84     Resting Oxygen Saturation  98 %     Exercise Oxygen Saturation  during 6 min walk 96 %     Max Ex. HR 81 bpm     Max Ex. BP 144/80     2 Minute Post BP 130/80  Oxygen Initial Assessment:   Oxygen Re-Evaluation:   Oxygen Discharge (Final Oxygen Re-Evaluation):   Initial Exercise Prescription:  Initial Exercise Prescription - 08/01/23 1000       Date of Initial Exercise RX and Referring Provider   Date 08/01/23    Referring Provider Tally Joe, MD      Oxygen   Maintain Oxygen Saturation 88% or higher      Treadmill   MPH 2    Grade 0    Minutes 15    METs 2.53      Recumbant Bike   Level 2    RPM 50    Watts 15    Minutes 15    METs 2.69      NuStep   Level 2    SPM 80    Minutes 15    METs 2.69      REL-XR   Level 1    Speed 50    Minutes 15    METs 2.69      T5 Nustep   Level 2    SPM 80    Minutes 15    METs 2.69      Track   Laps 30    Minutes 15    METs 2.63      Prescription Details   Frequency (times per week) 3    Duration Progress to 30 minutes of continuous aerobic without signs/symptoms of physical distress      Intensity   THRR 40-80% of Max Heartrate 103-136    Ratings of Perceived Exertion 11-13    Perceived Dyspnea 0-4      Progression   Progression Continue to progress workloads to maintain intensity without signs/symptoms of physical distress.      Resistance Training   Training Prescription Yes    Weight 3 lb    Reps 10-15             Perform Capillary Blood  Glucose checks as needed.  Exercise Prescription Changes:   Exercise Prescription Changes     Row Name 08/01/23 1000 08/13/23 1700 08/29/23 1100 09/14/23 0900 09/19/23 0900     Response to Exercise   Blood Pressure (Admit) 138/84 120/82 120/82 106/62 --   Blood Pressure (Exercise) 144/80 128/80 132/80 132/60 --   Blood Pressure (Exit) 130/80 114/72 122/74 110/58 --   Heart Rate (Admit) 76 bpm 78 bpm 85 bpm 77 bpm --   Heart Rate (Exercise) 81 bpm 95 bpm 110 bpm 106 bpm --   Heart Rate (Exit) 70 bpm 83 bpm 78 bpm 85 bpm --   Oxygen Saturation (Admit) 98 % -- -- -- --   Oxygen Saturation (Exercise) 96 % -- -- -- --   Oxygen Saturation (Exit) 96 % -- -- -- --   Rating of Perceived Exertion (Exercise) 12 13 14 14  --   Perceived Dyspnea (Exercise) 1 -- -- -- --   Symptoms Bilateral hip pain 7/10 none none none --   Comments Results first full day of exercise -- -- --   Duration Progress to 30 minutes of  aerobic without signs/symptoms of physical distress Progress to 30 minutes of  aerobic without signs/symptoms of physical distress Progress to 30 minutes of  aerobic without signs/symptoms of physical distress Continue with 30 min of aerobic exercise without signs/symptoms of physical distress. --   Intensity THRR New THRR unchanged THRR unchanged THRR unchanged --     Progression   Progression Continue to progress workloads to  maintain intensity without signs/symptoms of physical distress. Continue to progress workloads to maintain intensity without signs/symptoms of physical distress. Continue to progress workloads to maintain intensity without signs/symptoms of physical distress. Continue to progress workloads to maintain intensity without signs/symptoms of physical distress. --   Average METs 2.69 1.99 2.85 3.18 --     Resistance Training   Training Prescription -- Yes Yes Yes --   Weight -- 3 lb 3 lb 3 lb --   Reps -- 10-15 10-15 10-15 --     Interval Training   Interval  Training -- No No No --     Treadmill   MPH -- -- 3 3.3 --   Grade -- -- 0 0 --   Minutes -- -- 15 15 --   METs -- -- 3.3 3.53 --     NuStep   Level -- -- 5 4  T6: 1 --   Minutes -- -- 15 15 --   METs -- -- 3.5 --  T6: 2.3 --     REL-XR   Level -- -- 2 -- --   Minutes -- -- 15 -- --   METs -- -- 4.4 -- --     T5 Nustep   Level -- 1 1 2  --   Minutes -- 15 15 15  --   METs -- 2.1 2.5 2.4 --     Track   Laps -- 16 16 -- --   Minutes -- 15 15 -- --   METs -- 1.87 1.87 -- --     Home Exercise Plan   Plans to continue exercise at -- -- -- -- Home (comment)  Treadmill and resistance training at home   Frequency -- -- -- -- Add 2 additional days to program exercise sessions.   Initial Home Exercises Provided -- -- -- -- 09/19/23     Oxygen   Maintain Oxygen Saturation -- -- 88% or higher 88% or higher 88% or higher            Exercise Comments:   Exercise Comments     Row Name 08/06/23 0908           Exercise Comments First full day of exercise!  Patient was oriented to gym and equipment including functions, settings, policies, and procedures.  Patient's individual exercise prescription and treatment plan were reviewed.  All starting workloads were established based on the results of the 6 minute walk test done at initial orientation visit.  The plan for exercise progression was also introduced and progression will be customized based on patient's performance and goals.                Exercise Goals and Review:   Exercise Goals     Row Name 08/01/23 1053             Exercise Goals   Increase Physical Activity Yes       Intervention Provide advice, education, support and counseling about physical activity/exercise needs.;Develop an individualized exercise prescription for aerobic and resistive training based on initial evaluation findings, risk stratification, comorbidities and participant's personal goals.       Expected Outcomes Short Term: Attend rehab  on a regular basis to increase amount of physical activity.;Long Term: Add in home exercise to make exercise part of routine and to increase amount of physical activity.;Long Term: Exercising regularly at least 3-5 days a week.       Increase Strength and Stamina Yes  Intervention Provide advice, education, support and counseling about physical activity/exercise needs.;Develop an individualized exercise prescription for aerobic and resistive training based on initial evaluation findings, risk stratification, comorbidities and participant's personal goals.       Expected Outcomes Short Term: Increase workloads from initial exercise prescription for resistance, speed, and METs.;Short Term: Perform resistance training exercises routinely during rehab and add in resistance training at home;Long Term: Improve cardiorespiratory fitness, muscular endurance and strength as measured by increased METs and functional capacity ( )       Able to understand and use rate of perceived exertion (RPE) scale Yes       Intervention Provide education and explanation on how to use RPE scale       Expected Outcomes Short Term: Able to use RPE daily in rehab to express subjective intensity level;Long Term:  Able to use RPE to guide intensity level when exercising independently       Able to understand and use Dyspnea scale Yes       Intervention Provide education and explanation on how to use Dyspnea scale       Expected Outcomes Short Term: Able to use Dyspnea scale daily in rehab to express subjective sense of shortness of breath during exertion;Long Term: Able to use Dyspnea scale to guide intensity level when exercising independently       Knowledge and understanding of Target Heart Rate Range (THRR) Yes       Intervention Provide education and explanation of THRR including how the numbers were predicted and where they are located for reference       Expected Outcomes Short Term: Able to state/look up THRR;Short  Term: Able to use daily as guideline for intensity in rehab;Long Term: Able to use THRR to govern intensity when exercising independently       Able to check pulse independently Yes       Intervention Provide education and demonstration on how to check pulse in carotid and radial arteries.;Review the importance of being able to check your own pulse for safety during independent exercise       Expected Outcomes Short Term: Able to explain why pulse checking is important during independent exercise;Long Term: Able to check pulse independently and accurately       Understanding of Exercise Prescription Yes       Intervention Provide education, explanation, and written materials on patient's individual exercise prescription       Expected Outcomes Short Term: Able to explain program exercise prescription;Long Term: Able to explain home exercise prescription to exercise independently                Exercise Goals Re-Evaluation :  Exercise Goals Re-Evaluation     Row Name 08/06/23 0913 08/13/23 1722 08/23/23 0943 08/29/23 1151 09/14/23 0935     Exercise Goal Re-Evaluation   Exercise Goals Review Increase Physical Activity;Able to understand and use rate of perceived exertion (RPE) scale;Knowledge and understanding of Target Heart Rate Range (THRR);Understanding of Exercise Prescription;Increase Strength and Stamina;Able to check pulse independently Increase Physical Activity;Understanding of Exercise Prescription;Increase Strength and Stamina Increase Physical Activity;Increase Strength and Stamina;Understanding of Exercise Prescription -- Increase Physical Activity;Increase Strength and Stamina;Understanding of Exercise Prescription   Comments Reviewed RPE and dyspnea scale, THR and program prescription with pt today.  Pt voiced understanding and was given a copy of goals to take home. Lakevia completed her first exercise session. She was able to complete 16 laps on the track and work at level 1 on  the  T5 nustep. We will continue to monitor her progress in the program. Navya is doing well here at rehab, on treadmill today at speed of 3. She reports that her hips hurt after ~78mins of walking but if she stops for a bit, the pain goes away. Reports that coming to rehab has helped her walk longer before the onset of pain. Encouraged her to contineu to come to rehab and work on strength and stamina. Reminded her to comminicate with EP and rehab team if pain increases or last longer than usual. Jailene is doing well in rehab. She was able to increase her level on the T4 nustep from 2 to 5, and increase her level on the XR from 1 to 2. She was also able to increase her speed on the treadmill from to on no incline. We will continue to monitor her progress in the program. Quadasia continues to do well in rehab. She recently increased her treadmill workload to a speed of 3.3 mph with no incline. She also improved to level 2 on the T5 nustep and continues to do well with 3 lb hand weights for resistance training. We will continue to monitor her progress in the program.   Expected Outcomes Short: Use RPE daily to regulate intensity.  Long: Follow program prescription in THR. Short: Continue to follow current exercise prescription. Long: Continue exercise to improve strength and stamina. STG: increase workload as able without hurting hips. LTG: continue to exercise to improve strength and stamina Short: Continue to increase treadmill workload. Long: Continue exercise to improve strength and stamina. Short: Add incline to treadmill workload. Long: Continue exercise to improve strength and stamina.    Row Name 09/19/23 0935             Exercise Goal Re-Evaluation   Exercise Goals Review Increase Strength and Stamina;Understanding of Exercise Prescription;Able to understand and use Dyspnea scale;Increase Physical Activity;Knowledge and understanding of Target Heart Rate Range (THRR);Able to check pulse  independently;Able to understand and use rate of perceived exertion (RPE) scale       Comments Reviewed home exercise with pt today.  Pt plans to walk on her treadmill at home for exercise. Reviewed THR, pulse, RPE, sign and symptoms, pulse oximetery and when to call 911 or MD.  Also discussed weather considerations and indoor options.  Pt voiced understanding.       Expected Outcomes Short: Begin walking on treadmill on days away from rehab. Long: Continue to exercise independently.                Discharge Exercise Prescription (Final Exercise Prescription Changes):  Exercise Prescription Changes - 09/19/23 0900       Home Exercise Plan   Plans to continue exercise at Home (comment)   Treadmill and resistance training at home   Frequency Add 2 additional days to program exercise sessions.    Initial Home Exercises Provided 09/19/23      Oxygen   Maintain Oxygen Saturation 88% or higher             Nutrition:  Target Goals: Understanding of nutrition guidelines, daily intake of sodium 1500mg , cholesterol 200mg , calories 30% from fat and 7% or less from saturated fats, daily to have 5 or more servings of fruits and vegetables.  Education: All About Nutrition: -Group instruction provided by verbal, written material, interactive activities, discussions, models, and posters to present general guidelines for heart healthy nutrition including fat, fiber, MyPlate, the role of sodium  in heart healthy nutrition, utilization of the nutrition label, and utilization of this knowledge for meal planning. Follow up email sent as well. Written material given at graduation. Flowsheet Row Cardiac Rehab from 09/26/2023 in [facility] Cardiac and Pulmonary Rehab  Education need identified 08/01/23       Biometrics:  Pre Biometrics - 08/01/23 1054       Pre Biometrics   Height 5' 4.5" (1.638 m)    Weight 145 lb 11.2 oz (66.1 kg)    Waist Circumference 37.5 inches    Hip Circumference 39.5 inches     Waist to Hip Ratio 0.95 %    BMI (Calculated) 24.63    Single Leg Stand 30 seconds              Nutrition Therapy Plan and Nutrition Goals:  Nutrition Therapy & Goals - 08/27/23 1419       Nutrition Therapy   Diet Cardiac, Low Na    Protein (specify units) 75-90    Fiber 25 grams    Whole Grain Foods 3 servings    Saturated Fats 15 max. grams    Fruits and Vegetables 5 servings/day    Sodium 2 grams      Personal Nutrition Goals   Nutrition Goal Eat 15-30gProtein and 30-60gCarbs at each meal.    Personal Goal #2 Read labels and reduce sodium intake to below 2300mg . Ideally 1500mg  per day.    Personal Goal #3 Reduce saturated fat, less than 12g per day. Replace bad fats for more heart healthy fats.    Comments Patient drinking ~48oz of water. Some juice in small quantities. Rarely a tea or soda. She usually eats 3 times per day, but often eating out or getting pickup/delivery. Reviewed mediterranean diet handout, spoke about salt and sodium limits of below 1500mg  daily. Reviewed ways to cut back and stay below the limit most days. Educated on types of fats, sources, and how to read labels. Reviewed healthy plate sample handout and discussed the importance of balanced meals with both protein and complex carbs in controlled portions and colorful produce to stay full.      Intervention Plan   Intervention Prescribe, educate and counsel regarding individualized specific dietary modifications aiming towards targeted core components such as weight, hypertension, lipid management, diabetes, heart failure and other comorbidities.;Nutrition handout(s) given to patient.    Expected Outcomes Short Term Goal: Understand basic principles of dietary content, such as calories, fat, sodium, cholesterol and nutrients.;Short Term Goal: A plan has been developed with personal nutrition goals set during dietitian appointment.;Long Term Goal: Adherence to prescribed nutrition plan.              Nutrition Assessments:  MEDIFICTS Score Key: >=70 Need to make dietary changes  40-70 Heart Healthy Diet <= 40 Therapeutic Level Cholesterol Diet  Flowsheet Row Cardiac Rehab from 08/01/2023 in [facility] Cardiac and Pulmonary Rehab  Picture Your Plate Total Score on Admission 64      Picture Your Plate Scores: <16 Unhealthy dietary pattern with much room for improvement. 41-50 Dietary pattern unlikely to meet recommendations for good health and room for improvement. 51-60 More healthful dietary pattern, with some room for improvement.  >60 Healthy dietary pattern, although there may be some specific behaviors that could be improved.    Nutrition Goals Re-Evaluation:  Nutrition Goals Re-Evaluation     Row Name 08/23/23 0935 09/24/23 0950           Goals   Comment She reports she is  doing well with eating fruits but feels she could do better with eating more veggies. Commended her on including fruit and colorful produce in her diet. Spoke about her improving appetite and priortizing quality over quantity. Will do full assessment next week Haydyn reports that she is trying to read food labels and be aware of  her sodium and saturated fat intake. She reports that her kidney funtion has been a concern and she started a new medication that with her research she feels she needs to limit protein intake. She was encouraged to talk to her doctor or phamacist to get clear guidance on what she can eat while taking this medication.      Expected Outcome STG: Include more veggies and attend RD appointment next week. LTG: follow heart healthy lifestyle Short: follow up with doctor to get guidance on protein intake with new meds. Long: maintain heart healthy diet.               Nutrition Goals Discharge (Final Nutrition Goals Re-Evaluation):  Nutrition Goals Re-Evaluation - 09/24/23 0950       Goals   Comment Anne-Marie reports that she is trying to read food labels and be aware of  her sodium  and saturated fat intake. She reports that her kidney funtion has been a concern and she started a new medication that with her research she feels she needs to limit protein intake. She was encouraged to talk to her doctor or phamacist to get clear guidance on what she can eat while taking this medication.    Expected Outcome Short: follow up with doctor to get guidance on protein intake with new meds. Long: maintain heart healthy diet.             Psychosocial: Target Goals: Acknowledge presence or absence of significant depression and/or stress, maximize coping skills, provide positive support system. Participant is able to verbalize types and ability to use techniques and skills needed for reducing stress and depression.   Education: Stress, Anxiety, and Depression - Group verbal and visual presentation to define topics covered.  Reviews how body is impacted by stress, anxiety, and depression.  Also discusses healthy ways to reduce stress and to treat/manage anxiety and depression.  Written material given at graduation.   Education: Sleep Hygiene -Provides group verbal and written instruction about how sleep can affect your health.  Define sleep hygiene, discuss sleep cycles and impact of sleep habits. Review good sleep hygiene tips.    Initial Review & Psychosocial Screening:  Initial Psych Review & Screening - 06/18/23 0944       Initial Review   Current issues with None Identified      Family Dynamics   Good Support System? Yes   husband  Thayer Ohm     Barriers   Psychosocial barriers to participate in program There are no identifiable barriers or psychosocial needs.      Screening Interventions   Interventions Encouraged to exercise;To provide support and resources with identified psychosocial needs;Provide feedback about the scores to participant    Expected Outcomes Short Term goal: Utilizing psychosocial counselor, staff and physician to assist with identification of specific  Stressors or current issues interfering with healing process. Setting desired goal for each stressor or current issue identified.;Long Term Goal: Stressors or current issues are controlled or eliminated.;Short Term goal: Identification and review with participant of any Quality of Life or Depression concerns found by scoring the questionnaire.;Long Term goal: The participant improves quality of Life and PHQ9  Scores as seen by post scores and/or verbalization of changes             Quality of Life Scores:   Quality of Life - 08/01/23 1054       Quality of Life   Select Quality of Life      Quality of Life Scores   Health/Function Pre 22.5 %    Socioeconomic Pre 24.69 %    Psych/Spiritual Pre 25.36 %    Family Pre 30 %    GLOBAL Pre 24.64 %            Scores of 19 and below usually indicate a poorer quality of life in these areas.  A difference of  2-3 points is a clinically meaningful difference.  A difference of 2-3 points in the total score of the Quality of Life Index has been associated with significant improvement in overall quality of life, self-image, physical symptoms, and general health in studies assessing change in quality of life.  PHQ-9: Review Flowsheet       08/01/2023  Depression screen PHQ 2/9  Decreased Interest 0  Down, Depressed, Hopeless 0  PHQ - 2 Score 0  Altered sleeping 1  Tired, decreased energy 1  Change in appetite 0  Feeling bad or failure about yourself  0  Trouble concentrating 0  Moving slowly or fidgety/restless 0  Suicidal thoughts 0  PHQ-9 Score 2  Difficult doing work/chores Not difficult at all   Interpretation of Total Score  Total Score Depression Severity:  1-4 = Minimal depression, 5-9 = Mild depression, 10-14 = Moderate depression, 15-19 = Moderately severe depression, 20-27 = Severe depression   Psychosocial Evaluation and Intervention:  Psychosocial Evaluation - 06/18/23 0956       Psychosocial Evaluation &  Interventions   Interventions Encouraged to exercise with the program and follow exercise prescription    Comments Xylah has no barriers to attending the program. She lives with her husband and he is her support person.  She is ready to start the program after her home PT.    Expected Outcomes STG attends all scheduled sessions  LTG utilizes education and resources obtained during the program to contineu her healthy lifestyle steps.    Continue Psychosocial Services  Follow up required by staff             Psychosocial Re-Evaluation:  Psychosocial Re-Evaluation     Row Name 08/23/23 0940 09/24/23 0946           Psychosocial Re-Evaluation   Current issues with None Identified None Identified      Comments Vitoria reports no depression, anxiety or stress. Says she has a good support system with her husband, friends and church. She says she wakes up a few times in the middle of the night but gets restful sleep and wakes up well rested most mornings. Cortana reports no sleep or mental health concerns. She does have some health concerns with her kidney funtion related to a medication she was taking. She has been working closely with her doctor to adjust her meds and get to the root cause of what is going on. She reports that she has a good support system.      Expected Outcomes STG: continue to attend rehab and use support systems as needed. LTG: maintain positive outlook on health and daily life Short: continue to follow up with doctor closely about kidney health that his causing her some concern. Long: maintian good mental health routine.  Interventions Encouraged to attend Cardiac Rehabilitation for the exercise Encouraged to attend Cardiac Rehabilitation for the exercise      Continue Psychosocial Services  Follow up required by staff Follow up required by staff               Psychosocial Discharge (Final Psychosocial Re-Evaluation):  Psychosocial Re-Evaluation - 09/24/23 0946        Psychosocial Re-Evaluation   Current issues with None Identified    Comments Leith reports no sleep or mental health concerns. She does have some health concerns with her kidney funtion related to a medication she was taking. She has been working closely with her doctor to adjust her meds and get to the root cause of what is going on. She reports that she has a good support system.    Expected Outcomes Short: continue to follow up with doctor closely about kidney health that his causing her some concern. Long: maintian good mental health routine.    Interventions Encouraged to attend Cardiac Rehabilitation for the exercise    Continue Psychosocial Services  Follow up required by staff             Vocational Rehabilitation: Provide vocational rehab assistance to qualifying candidates.   Vocational Rehab Evaluation & Intervention:  Vocational Rehab - 06/18/23 0946       Initial Vocational Rehab Evaluation & Intervention   Assessment shows need for Vocational Rehabilitation No      Vocational Rehab Re-Evaulation   Comments retired             Education: Education Goals: Education classes will be provided on a variety of topics geared toward better understanding of heart health and risk factor modification. Participant will state understanding/return demonstration of topics presented as noted by education test scores.  Learning Barriers/Preferences:  Learning Barriers/Preferences - 06/18/23 0945       Learning Barriers/Preferences   Learning Barriers Hearing   wears hearing aid   Learning Preferences None             General Cardiac Education Topics:  AED/CPR: - Group verbal and written instruction with the use of models to demonstrate the basic use of the AED with the basic ABC's of resuscitation.   Anatomy and Cardiac Procedures: - Group verbal and visual presentation and models provide information about basic cardiac anatomy and function. Reviews the testing  methods done to diagnose heart disease and the outcomes of the test results. Describes the treatment choices: Medical Management, Angioplasty, or Coronary Bypass Surgery for treating various heart conditions including Myocardial Infarction, Angina, Valve Disease, and Cardiac Arrhythmias.  Written material given at graduation. Flowsheet Row Cardiac Rehab from 09/26/2023 in [facility] Cardiac and Pulmonary Rehab  Date 08/15/23  Educator sb  Instruction Review Code 1- Verbalizes Understanding       Medication Safety: - Group verbal and visual instruction to review commonly prescribed medications for heart and lung disease. Reviews the medication, class of the drug, and side effects. Includes the steps to properly store meds and maintain the prescription regimen.  Written material given at graduation.   Intimacy: - Group verbal instruction through game format to discuss how heart and lung disease can affect sexual intimacy. Written material given at graduation..   Know Your Numbers and Heart Failure: - Group verbal and visual instruction to discuss disease risk factors for cardiac and pulmonary disease and treatment options.  Reviews associated critical values for Overweight/Obesity, Hypertension, Cholesterol, and Diabetes.  Discusses basics of heart failure: signs/symptoms  and treatments.  Introduces Heart Failure Zone chart for action plan for heart failure.  Written material given at graduation. Flowsheet Row Cardiac Rehab from 09/26/2023 in [facility] Cardiac and Pulmonary Rehab  Date 08/29/23  Educator [facility]  Instruction Review Code 1- Verbalizes Understanding       Infection Prevention: - Provides verbal and written material to individual with discussion of infection control including proper hand washing and proper equipment cleaning during exercise session. Flowsheet Row Cardiac Rehab from 09/26/2023 in [facility] Cardiac and Pulmonary Rehab  Date 08/01/23  Educator MB  Instruction Review Code 1-  Verbalizes Understanding       Falls Prevention: - Provides verbal and written material to individual with discussion of falls prevention and safety. Flowsheet Row Cardiac Rehab from 09/26/2023 in [facility] Cardiac and Pulmonary Rehab  Date 08/01/23  Educator MB  Instruction Review Code 1- Verbalizes Understanding       Other: -Provides group and verbal instruction on various topics (see comments)   Knowledge Questionnaire Score:  Knowledge Questionnaire Score - 08/01/23 1059       Knowledge Questionnaire Score   Pre Score 25/26             Core Components/Risk Factors/Patient Goals at Admission:  Personal Goals and Risk Factors at Admission - 08/01/23 1059       Core Components/Risk Factors/Patient Goals on Admission    Weight Management Yes;Weight Maintenance    Intervention Weight Management: Develop a combined nutrition and exercise program designed to reach desired caloric intake, while maintaining appropriate intake of nutrient and fiber, sodium and fats, and appropriate energy expenditure required for the weight goal.;Weight Management: Provide education and appropriate resources to help participant work on and attain dietary goals.    Admit Weight 145 lb 11.2 oz (66.1 kg)    Goal Weight: Short Term 145 lb (65.8 kg)    Goal Weight: Long Term 145 lb (65.8 kg)    Expected Outcomes Short Term: Continue to assess and modify interventions until short term weight is achieved;Long Term: Adherence to nutrition and physical activity/exercise program aimed toward attainment of established weight goal;Weight Maintenance: Understanding of the daily nutrition guidelines, which includes 25-35% calories from fat, 7% or less cal from saturated fats, less than 200mg  cholesterol, less than 1.5gm of sodium, & 5 or more servings of fruits and vegetables daily;Understanding recommendations for meals to include 15-35% energy as protein, 25-35% energy from fat, 35-60% energy from carbohydrates,  less than 200mg  of dietary cholesterol, 20-35 gm of total fiber daily;Understanding of distribution of calorie intake throughout the day with the consumption of 4-5 meals/snacks    Hypertension Yes    Intervention Provide education on lifestyle modifcations including regular physical activity/exercise, weight management, moderate sodium restriction and increased consumption of fresh fruit, vegetables, and low fat dairy, alcohol moderation, and smoking cessation.;Monitor prescription use compliance.    Expected Outcomes Short Term: Continued assessment and intervention until BP is < 140/20mm HG in hypertensive participants. < 130/53mm HG in hypertensive participants with diabetes, heart failure or chronic kidney disease.;Long Term: Maintenance of blood pressure at goal levels.             Education:Diabetes - Individual verbal and written instruction to review signs/symptoms of diabetes, desired ranges of glucose level fasting, after meals and with exercise. Acknowledge that pre and post exercise glucose checks will be done for 3 sessions at entry of program.   Core Components/Risk Factors/Patient Goals Review:   Goals and Risk Factor Review  Row Name 08/23/23 0931 09/24/23 0952           Core Components/Risk Factors/Patient Goals Review   Personal Goals Review Weight Management/Obesity Hypertension      Review She reports she lost her appetite after her surgery and lost ~15lbs. But she has since regained her appetite and stablized her weight. Spoke about ensuring her body is strong regardless of the number on the scale. She agrees and will continue to attend rehab to improve strength and stamina. Jamicia states that she does monitor BP at home but gets much higher numbers then we have been getting while monitoring it in cardiac rehab class. She was concerned about her personal BP cuff's accuracy and she was encouraged to bring it into class so we could check in against the BP we are taking  in class.      Expected Outcomes STG: attend rehab and meet nutrtional needs to maintain weight. LTG: achieve and maintain healthy weight Short: bring BP cuff into class for staff to check accuacy. Long: maintian good BP and continue to take meds and follow up with doctor if there are any concerns.               Core Components/Risk Factors/Patient Goals at Discharge (Final Review):   Goals and Risk Factor Review - 09/24/23 0952       Core Components/Risk Factors/Patient Goals Review   Personal Goals Review Hypertension    Review Evonna states that she does monitor BP at home but gets much higher numbers then we have been getting while monitoring it in cardiac rehab class. She was concerned about her personal BP cuff's accuracy and she was encouraged to bring it into class so we could check in against the BP we are taking in class.    Expected Outcomes Short: bring BP cuff into class for staff to check accuacy. Long: maintian good BP and continue to take meds and follow up with doctor if there are any concerns.             ITP Comments:  ITP Comments     Row Name 06/18/23 0954 08/01/23 1047 08/06/23 0907 08/29/23 1140 09/26/23 1043   ITP Comments Virtual orientation call completed today. shehas an appointment on Date: will schedule for Jan after [facility] PT completed  for EP eval and gym Orientation.  Documentation of diagnosis can be found in [facility] Date: 05/22/2023 . Completed and gym orientation. Initial ITP created and sent for review to Dr. Bethann Punches, Medical Director. First full day of exercise!  Patient was oriented to gym and equipment including functions, settings, policies, and procedures.  Patient's individual exercise prescription and treatment plan were reviewed.  All starting workloads were established based on the results of the 6 minute walk test done at initial orientation visit.  The plan for exercise progression was also introduced and progression will be customized based  on patient's performance and goals. 30 Day review completed. Medical Director ITP review done, changes made as directed, and signed approval by Medical Director.    new to program 30 Day review completed. Medical Director ITP review done, changes made as directed, and signed approval by Medical Director.            Comments: 30 day review

## 2023-09-26 NOTE — Progress Notes (Signed)
 Daily Session Note  Patient Details  Name: NETRA POSTLETHWAIT MRN: [id number] Date of Birth: 10/05/1955 Referring Provider:   Flowsheet Row Cardiac Rehab from 08/01/2023 in [facility] Cardiac and Pulmonary Rehab  Referring Provider Tally Joe, MD       Encounter Date: 09/26/2023  Check In:  Session Check In - 09/26/23 1008       Check-In   Supervising physician immediately available to respond to emergencies See telemetry face sheet for immediately available ER MD    Location [facility]-Cardiac & Pulmonary Rehab    Staff Present Cora Collum, RN, BSN, CCRP;Noah Tickle, BS, Exercise Physiologist;Maxon Conetta BS, Exercise Physiologist;Joseph Hood RCP,RRT,BSRT    Virtual Visit No    Medication changes reported     No    Fall or balance concerns reported    No    Warm-up and Cool-down Performed on first and last piece of equipment    Resistance Training Performed No    VAD Patient? No    PAD/SET Patient? No      Pain Assessment   Currently in Pain? No/denies                Social History   Tobacco Use  Smoking Status Former   Current packs/day: 0.00   Types: Cigarettes   Quit date: 02/13/1974   Years since quitting: 49.6  Smokeless Tobacco Never    Goals Met:  Independence with exercise equipment Exercise tolerated well No report of concerns or symptoms today  Goals Unmet:  Not Applicable  Comments: Pt able to follow exercise prescription today without complaint.  Will continue to monitor for progression.    Dr. Bethann Punches is Medical Director for [facility] Cardiac Rehabilitation.  Dr. Vida Rigger is Medical Director for [facility] Pulmonary Rehabilitation.

## 2023-09-27 ENCOUNTER — Encounter: Payer: Medicare Other | Admitting: *Deleted

## 2023-09-27 DIAGNOSIS — Z48812 Encounter for surgical aftercare following surgery on the circulatory system: Secondary | ICD-10-CM | POA: Diagnosis not present

## 2023-09-27 DIAGNOSIS — Z9889 Other specified postprocedural states: Secondary | ICD-10-CM

## 2023-09-27 NOTE — Progress Notes (Signed)
 Daily Session Note  Patient Details  Name: [name] MRN: [id number] Date of Birth: [birth year] Referring Provider:   Flowsheet Row Cardiac Rehab from 08/01/2023 in [facility] Cardiac and Pulmonary Rehab  Referring Provider Tally Joe, MD       Encounter Date: 09/27/2023  Check In:  Session Check In - 09/27/23 0943       Check-In   Supervising physician immediately available to respond to emergencies See telemetry face sheet for immediately available ER MD    Location [facility]-Cardiac & Pulmonary Rehab    Staff Present Cora Collum, RN, BSN, CCRP;Margaret Best, MS, Exercise Physiologist;Joseph Reino Kent RCP,RRT,BSRT    Virtual Visit No    Medication changes reported     No    Fall or balance concerns reported    No    Warm-up and Cool-down Performed on first and last piece of equipment    Resistance Training Performed Yes    VAD Patient? No    PAD/SET Patient? No      Pain Assessment   Currently in Pain? No/denies                Social History   Tobacco Use  Smoking Status Former   Current packs/day: 0.00   Types: Cigarettes   Quit date: 02/13/1974   Years since quitting: 49.6  Smokeless Tobacco Never    Goals Met:  Independence with exercise equipment Exercise tolerated well No report of concerns or symptoms today  Goals Unmet:  Not Applicable  Comments: Pt able to follow exercise prescription today without complaint.  Will continue to monitor for progression.    Dr. Bethann Punches is Medical Director for [facility] Cardiac Rehabilitation.  Dr. Vida Rigger is Medical Director for [facility] Pulmonary Rehabilitation.

## 2023-09-30 ENCOUNTER — Encounter: Payer: Self-pay | Admitting: Internal Medicine

## 2023-10-01 ENCOUNTER — Encounter: Payer: Medicare Other | Admitting: *Deleted

## 2023-10-01 DIAGNOSIS — Z9889 Other specified postprocedural states: Secondary | ICD-10-CM | POA: Diagnosis not present

## 2023-10-01 DIAGNOSIS — Z48812 Encounter for surgical aftercare following surgery on the circulatory system: Secondary | ICD-10-CM | POA: Diagnosis not present

## 2023-10-01 NOTE — Progress Notes (Signed)
 Daily Session Note  Patient Details  Name: [name] MRN: [id number] Date of Birth: 21-Feb-1956 Referring Provider:   Flowsheet Row Cardiac Rehab from 08/01/2023 in [facility] Cardiac and Pulmonary Rehab  Referring Provider Tally Joe, MD       Encounter Date: 10/01/2023  Check In:  Session Check In - 10/01/23 0948       Check-In   Supervising physician immediately available to respond to emergencies See telemetry face sheet for immediately available ER MD    Location [facility]-Cardiac & Pulmonary Rehab    Staff Present Cora Collum, RN, BSN, CCRP;Kelly Hayes BS, ACSM CEP, Exercise Physiologist;Margaret Best, MS, Exercise Physiologist;Jason Wallace Cullens RDN,LDN    Virtual Visit No    Medication changes reported     No    Fall or balance concerns reported    No    Warm-up and Cool-down Performed on first and last piece of equipment    Resistance Training Performed Yes    VAD Patient? No    PAD/SET Patient? No      Pain Assessment   Currently in Pain? No/denies                Social History   Tobacco Use  Smoking Status Former   Current packs/day: 0.00   Types: Cigarettes   Quit date: 02/13/1974   Years since quitting: 49.6  Smokeless Tobacco Never    Goals Met:  Independence with exercise equipment Exercise tolerated well Personal goals reviewed No report of concerns or symptoms today  Goals Unmet:  Not Applicable  Comments: Pt able to follow exercise prescription today without complaint.  Will continue to monitor for progression.    Dr. Bethann Punches is Medical Director for [facility] Cardiac Rehabilitation.  Dr. Vida Rigger is Medical Director for [facility] Pulmonary Rehabilitation.

## 2023-10-03 ENCOUNTER — Encounter: Payer: Medicare Other | Admitting: *Deleted

## 2023-10-03 ENCOUNTER — Other Ambulatory Visit (HOSPITAL_COMMUNITY): Payer: Self-pay

## 2023-10-03 DIAGNOSIS — Z9889 Other specified postprocedural states: Secondary | ICD-10-CM | POA: Diagnosis not present

## 2023-10-03 DIAGNOSIS — Z48812 Encounter for surgical aftercare following surgery on the circulatory system: Secondary | ICD-10-CM | POA: Diagnosis not present

## 2023-10-03 NOTE — Progress Notes (Signed)
 Daily Session Note  Patient Details  Name: [name] MRN: [id number] Date of Birth: [birth year] Referring Provider:   Flowsheet Row Cardiac Rehab from 08/01/2023 in [facility] Cardiac and Pulmonary Rehab  Referring Provider Tally Joe, MD       Encounter Date: 10/03/2023  Check In:  Session Check In - 10/03/23 0936       Check-In   Supervising physician immediately available to respond to emergencies See telemetry face sheet for immediately available ER MD    Location [facility]-Cardiac & Pulmonary Rehab    Staff Present Maxon Conetta BS, Exercise Physiologist;Noah Tickle, BS, Exercise Physiologist;Joseph Hood RCP,RRT,BSRT;Cora Collum, RN, BSN, CCRP    Virtual Visit No    Medication changes reported     No    Fall or balance concerns reported    No    Warm-up and Cool-down Performed on first and last piece of equipment    Resistance Training Performed Yes    VAD Patient? No    PAD/SET Patient? No      Pain Assessment   Currently in Pain? No/denies                Social History   Tobacco Use  Smoking Status Former   Current packs/day: 0.00   Types: Cigarettes   Quit date: 02/13/1974   Years since quitting: 49.6  Smokeless Tobacco Never    Goals Met:  Independence with exercise equipment Exercise tolerated well No report of concerns or symptoms today  Goals Unmet:  Not Applicable  Comments: Pt able to follow exercise prescription today without complaint.  Will continue to monitor for progression.    Dr. Bethann Punches is Medical Director for [facility] Cardiac Rehabilitation.  Dr. Vida Rigger is Medical Director for [facility] Pulmonary Rehabilitation.

## 2023-10-04 ENCOUNTER — Ambulatory Visit: Payer: Medicare Other

## 2023-10-15 ENCOUNTER — Encounter: Payer: Medicare Other | Admitting: *Deleted

## 2023-10-15 DIAGNOSIS — Z9889 Other specified postprocedural states: Secondary | ICD-10-CM

## 2023-10-15 DIAGNOSIS — K080 Exfoliation of teeth due to systemic causes: Secondary | ICD-10-CM | POA: Diagnosis not present

## 2023-10-15 DIAGNOSIS — Z48812 Encounter for surgical aftercare following surgery on the circulatory system: Secondary | ICD-10-CM | POA: Diagnosis not present

## 2023-10-15 NOTE — Progress Notes (Signed)
 Daily Session Note  Patient Details  Name: [name] MRN: [id number] Date of Birth: 1955-10-30 Referring Provider:   Flowsheet Row Cardiac Rehab from 08/01/2023 in [facility] Cardiac and Pulmonary Rehab  Referring Provider Tally Joe, MD       Encounter Date: 10/15/2023  Check In:  Session Check In - 10/15/23 1533       Check-In   Supervising physician immediately available to respond to emergencies See telemetry face sheet for immediately available ER MD    Location [facility]-Cardiac & Pulmonary Rehab    Staff Present Elige Ko RCP,RRT,BSRT;Rigel Filsinger Jewel Baize RN,BSN;Maxon Conetta BS, Exercise Physiologist    Virtual Visit No    Medication changes reported     No    Fall or balance concerns reported    No    Warm-up and Cool-down Performed on first and last piece of equipment    Resistance Training Performed Yes    VAD Patient? No    PAD/SET Patient? No      Pain Assessment   Currently in Pain? No/denies                Social History   Tobacco Use  Smoking Status Former   Current packs/day: 0.00   Types: Cigarettes   Quit date: 02/13/1974   Years since quitting: 49.7  Smokeless Tobacco Never    Goals Met:  Independence with exercise equipment Exercise tolerated well No report of concerns or symptoms today Strength training completed today  Goals Unmet:  Not Applicable  Comments: Pt able to follow exercise prescription today without complaint.  Will continue to monitor for progression.    Dr. Bethann Punches is Medical Director for [facility] Cardiac Rehabilitation.  Dr. Vida Rigger is Medical Director for [facility] Pulmonary Rehabilitation.

## 2023-10-16 DIAGNOSIS — I10 Essential (primary) hypertension: Secondary | ICD-10-CM | POA: Diagnosis not present

## 2023-10-16 LAB — BASIC METABOLIC PANEL WITH GFR
BUN/Creatinine Ratio: 18 (ref 12–28)
BUN: 19 mg/dL (ref 8–27)
CO2: 25 mmol/L (ref 20–29)
Calcium: 9.5 mg/dL (ref 8.7–10.3)
Chloride: 98 mmol/L (ref 96–106)
Creatinine, Ser: 1.05 mg/dL — ABNORMAL HIGH (ref 0.57–1.00)
Glucose: 87 mg/dL (ref 70–99)
Potassium: 3.2 mmol/L — ABNORMAL LOW (ref 3.5–5.2)
Sodium: 139 mmol/L (ref 134–144)
eGFR: 58 mL/min/{1.73_m2} — ABNORMAL LOW (ref >59)

## 2023-10-17 ENCOUNTER — Telehealth: Payer: Self-pay | Admitting: Pharmacist

## 2023-10-17 ENCOUNTER — Encounter: Payer: Medicare Other | Attending: Family Medicine | Admitting: *Deleted

## 2023-10-17 DIAGNOSIS — Z9889 Other specified postprocedural states: Secondary | ICD-10-CM

## 2023-10-17 NOTE — Progress Notes (Signed)
 Daily Session Note  Patient Details  Name: [name] MRN: [id number] Date of Birth: 12/19/1955 Referring Provider:   Flowsheet Row Cardiac Rehab from 08/01/2023 in [facility] Cardiac and Pulmonary Rehab  Referring Provider Tally Joe, MD       Encounter Date: 10/17/2023  Check In:  Session Check In - 10/17/23 0948       Check-In   Supervising physician immediately available to respond to emergencies See telemetry face sheet for immediately available ER MD    Location [facility]-Cardiac & Pulmonary Rehab    Staff Present Cora Collum, RN, BSN, CCRP;Joseph Hood RCP,RRT,BSRT;Maxon El Dara BS, Exercise Physiologist;Noah Tickle, BS, Exercise Physiologist;Jason Wallace Cullens RDN,LDN    Virtual Visit No    Medication changes reported     No    Fall or balance concerns reported    No    Warm-up and Cool-down Performed on first and last piece of equipment    Resistance Training Performed Yes    VAD Patient? No    PAD/SET Patient? No      Pain Assessment   Currently in Pain? No/denies                Social History   Tobacco Use  Smoking Status Former   Current packs/day: 0.00   Types: Cigarettes   Quit date: 02/13/1974   Years since quitting: 49.7  Smokeless Tobacco Never    Goals Met:  Independence with exercise equipment Exercise tolerated well No report of concerns or symptoms today  Goals Unmet:  Not Applicable  Comments: Pt able to follow exercise prescription today without complaint.  Will continue to monitor for progression.    Dr. Bethann Punches is Medical Director for [facility] Cardiac Rehabilitation.  Dr. Vida Rigger is Medical Director for [facility] Pulmonary Rehabilitation.

## 2023-10-17 NOTE — Telephone Encounter (Signed)
 Call to discuss BMP result. N/A called twice LVM to call back at [phone]

## 2023-10-19 NOTE — Telephone Encounter (Signed)
 Discussed last BMP over the phone, renal function improving off losartan. K level is slightly low. Patient states she is taking 99 mg of OTC potassium. She was taking 2 pills per day and after the test result she has started taking 3 pills per day. F/u apt with me on 11/01/23.  Pt reports in th past she was on prescription strength potassium supplement - due to colitis was not able digest and absorb tablet well, OTC formulation works better for her.

## 2023-10-22 ENCOUNTER — Encounter: Payer: Medicare Other | Admitting: *Deleted

## 2023-10-22 DIAGNOSIS — Z9889 Other specified postprocedural states: Secondary | ICD-10-CM | POA: Diagnosis not present

## 2023-10-22 NOTE — Progress Notes (Signed)
 Daily Session Note  Patient Details  Name: [name] MRN: [id number] Date of Birth: 05-Jan-1956 Referring Provider:   Flowsheet Row Cardiac Rehab from 08/01/2023 in [facility] Cardiac and Pulmonary Rehab  Referring Provider Tally Joe, MD       Encounter Date: 10/22/2023  Check In:  Session Check In - 10/22/23 1059       Check-In   Supervising physician immediately available to respond to emergencies See telemetry face sheet for immediately available ER MD    Location [facility]-Cardiac & Pulmonary Rehab    Staff Present Cora Collum, RN, BSN, CCRP;Joseph Hood RCP,RRT,BSRT;Maxon [employer], Exercise Physiologist;Kelly [employer], ACSM CEP, Exercise Physiologist    Virtual Visit No    Medication changes reported     No    Fall or balance concerns reported    No    Warm-up and Cool-down Performed on first and last piece of equipment    Resistance Training Performed Yes    VAD Patient? No    PAD/SET Patient? No      Pain Assessment   Currently in Pain? No/denies                Social History   Tobacco Use  Smoking Status Former   Current packs/day: 0.00   Types: Cigarettes   Quit date: 02/13/1974   Years since quitting: 49.7  Smokeless Tobacco Never    Goals Met:  Independence with exercise equipment Exercise tolerated well No report of concerns or symptoms today  Goals Unmet:  Not Applicable  Comments: Pt able to follow exercise prescription today without complaint.  Will continue to monitor for progression.    Dr. Bethann Punches is Medical Director for [facility] Cardiac Rehabilitation.  Dr. Vida Rigger is Medical Director for [facility] Pulmonary Rehabilitation.

## 2023-10-24 ENCOUNTER — Encounter: Payer: Self-pay | Admitting: *Deleted

## 2023-10-24 DIAGNOSIS — Z9889 Other specified postprocedural states: Secondary | ICD-10-CM

## 2023-10-24 NOTE — Progress Notes (Signed)
 Cardiac Individual Treatment Plan  Patient Details  Name: [name] MRN: [id number] Date of Birth: [birth year] Referring Provider:   Flowsheet Row Cardiac Rehab from 08/01/2023 in [facility] Cardiac and Pulmonary Rehab  Referring Provider Tally Joe, MD       Initial Encounter Date:  Flowsheet Row Cardiac Rehab from 08/01/2023 in [facility] Cardiac and Pulmonary Rehab  Date 08/01/23       Visit Diagnosis: S/P aortic valve repair  S/P aortic dissection repair  Patient's Home Medications on Admission:  Current Outpatient Medications:    amLODipine (NORVASC) 5 MG tablet, Take 1 tablet (5 mg total) by mouth daily., Disp: 180 tablet, Rfl: 3   amoxicillin (AMOXIL) 500 MG tablet, Take 30 min-1 hour prior to all dental work, Disp: 4 tablet, Rfl: 0   aspirin EC 81 MG tablet, Take 81 mg by mouth daily. Swallow whole., Disp: , Rfl:    calcium carbonate (OSCAL) 1500 (600 Ca) MG TABS tablet, Take 600 mg of elemental calcium by mouth daily., Disp: , Rfl:    carvedilol (COREG) 12.5 MG tablet, Take 1 tablet (12.5 mg total) by mouth 2 (two) times daily., Disp: 180 tablet, Rfl: 3   Cholecalciferol (VITAMIN D3 SUPER STRENGTH) 50 MCG (2000 UT) CAPS, Take by mouth daily at 6 (six) AM., Disp: , Rfl:    cholestyramine light (PREVALITE) 4 g packet, Take 4 g by mouth 2 (two) times daily., Disp: , Rfl:    Glucosamine Sulfate 500 MG TABS, Take by mouth as needed (joint pain)., Disp: , Rfl:    Niacin (VITAMIN B-3 PO), Take 1 tablet by mouth daily., Disp: , Rfl:    NON FORMULARY, Pt uses a cpap nightly, Disp: , Rfl:    POTASSIUM PO, Take 2 tablets by mouth daily., Disp: , Rfl:    triamterene-hydrochlorothiazide (MAXZIDE) 75-50 MG tablet, Take 1 tablet by mouth daily., Disp: , Rfl:   Past Medical History: Past Medical History:  Diagnosis Date   Actinic keratosis    Arthritis    Diverticulosis    Dysplastic nevus 09/27/2009   Left post waistline. Mild atypia. Limited margins free.   History of gallstones     History of hiatal hernia    Hx of basal cell carcinoma 06/18/2012   L bra line infrascapular   Hx of squamous cell carcinoma of skin    multiple sites   Hypertension    Peptic ulcer disease 1991   Sleep apnea    cpap   Squamous cell carcinoma in situ (SCCIS) 04/26/2022   left abdomen ED&C 07/03/22   Squamous cell carcinoma of skin 05/07/2007   Right medial superior breast. SCCis   Squamous cell carcinoma of skin 06/09/2013   Right medial calf. WD SCC. Recurrent 07/28/2013. Tx: EDC   Squamous cell carcinoma of skin 01/13/2014   Left medial pretibial. SCC, KA pattern   Squamous cell carcinoma of skin 09/06/2016   Left medial lower leg. WD SCC   Squamous cell carcinoma of skin 11/07/2017   Right distal pretibial. WD SCC. Tx: EDC   Squamous cell carcinoma of skin 07/03/2022   Left pretibia. EDC    Tobacco Use: Social History   Tobacco Use  Smoking Status Former   Current packs/day: 0.00   Types: Cigarettes   Quit date: 02/13/1974   Years since quitting: 49.7  Smokeless Tobacco Never    Labs: Review Flowsheet       Latest Ref Rng & Units 05/22/2023  Labs for ITP Cardiac and Pulmonary Rehab  TCO2 22 - 32 mmol/L 27      Exercise Target Goals: Exercise Program Goal: Individual exercise prescription set using results from initial 6 min walk test and THRR while considering  patient's activity barriers and safety.   Exercise Prescription Goal: Initial exercise prescription builds to 30-45 minutes a day of aerobic activity, 2-3 days per week.  Home exercise guidelines will be given to patient during program as part of exercise prescription that the participant will acknowledge.   Education: Aerobic Exercise: - Group verbal and visual presentation on the components of exercise prescription. Introduces F.I.T.T principle from ACSM for exercise prescriptions.  Reviews F.I.T.T. principles of aerobic exercise including progression. Written material given at  graduation. Flowsheet Row Cardiac Rehab from 10/17/2023 in [facility] Cardiac and Pulmonary Rehab  Date 10/03/23  Educator NT  Instruction Review Code 1- Verbalizes Understanding       Education: Resistance Exercise: - Group verbal and visual presentation on the components of exercise prescription. Introduces F.I.T.T principle from ACSM for exercise prescriptions  Reviews F.I.T.T. principles of resistance exercise including progression. Written material given at graduation.    Education: Exercise & Equipment Safety: - Individual verbal instruction and demonstration of equipment use and safety with use of the equipment. Flowsheet Row Cardiac Rehab from 10/17/2023 in [facility] Cardiac and Pulmonary Rehab  Date 08/01/23  Educator MB  Instruction Review Code 1- Verbalizes Understanding       Education: Exercise Physiology & General Exercise Guidelines: - Group verbal and written instruction with models to review the exercise physiology of the cardiovascular system and associated critical values. Provides general exercise guidelines with specific guidelines to those with heart or lung disease.    Education: Flexibility, Balance, Mind/Body Relaxation: - Group verbal and visual presentation with interactive activity on the components of exercise prescription. Introduces F.I.T.T principle from ACSM for exercise prescriptions. Reviews F.I.T.T. principles of flexibility and balance exercise training including progression. Also discusses the mind body connection.  Reviews various relaxation techniques to help reduce and manage stress (i.e. Deep breathing, progressive muscle relaxation, and visualization). Balance handout provided to take home. Written material given at graduation. Flowsheet Row Cardiac Rehab from 10/17/2023 in [facility] Cardiac and Pulmonary Rehab  Date 09/26/23  Educator NT  Instruction Review Code 1- Verbalizes Understanding       Activity Barriers & Risk Stratification:  Activity Barriers &  Cardiac Risk Stratification - 08/01/23 1049       Activity Barriers & Cardiac Risk Stratification   Activity Barriers Joint Problems   Bilateral hip pain   Cardiac Risk Stratification Low             6 Minute Walk:  6 Minute Walk     Row Name 08/01/23 1048         6 Minute Walk   Phase Initial     Distance 1085 feet     Walk Time 6 minutes     # of Rest Breaks 0     MPH 2.05     METS 2.69     RPE 12     Perceived Dyspnea  1     VO2 Peak 9.41     Symptoms Yes (comment)     Comments Bilateral hip pain 7/10 at end of test     Resting HR 76 bpm     Resting BP 138/84     Resting Oxygen Saturation  98 %     Exercise Oxygen Saturation  during 6 min walk 96 %  Max Ex. HR 81 bpm     Max Ex. BP 144/80     2 Minute Post BP 130/80              Oxygen Initial Assessment:   Oxygen Re-Evaluation:   Oxygen Discharge (Final Oxygen Re-Evaluation):   Initial Exercise Prescription:  Initial Exercise Prescription - 08/01/23 1000       Date of Initial Exercise RX and Referring Provider   Date 08/01/23    Referring Provider Tally Joe, MD      Oxygen   Maintain Oxygen Saturation 88% or higher      Treadmill   MPH 2    Grade 0    Minutes 15    METs 2.53      Recumbant Bike   Level 2    RPM 50    Watts 15    Minutes 15    METs 2.69      NuStep   Level 2    SPM 80    Minutes 15    METs 2.69      REL-XR   Level 1    Speed 50    Minutes 15    METs 2.69      T5 Nustep   Level 2    SPM 80    Minutes 15    METs 2.69      Track   Laps 30    Minutes 15    METs 2.63      Prescription Details   Frequency (times per week) 3    Duration Progress to 30 minutes of continuous aerobic without signs/symptoms of physical distress      Intensity   THRR 40-80% of Max Heartrate 103-136    Ratings of Perceived Exertion 11-13    Perceived Dyspnea 0-4      Progression   Progression Continue to progress workloads to maintain intensity without  signs/symptoms of physical distress.      Resistance Training   Training Prescription Yes    Weight 3 lb    Reps 10-15             Perform Capillary Blood Glucose checks as needed.  Exercise Prescription Changes:   Exercise Prescription Changes     Row Name 08/01/23 1000 08/13/23 1700 08/29/23 1100 09/14/23 0900 09/19/23 0900     Response to Exercise   Blood Pressure (Admit) 138/84 120/82 120/82 106/62 --   Blood Pressure (Exercise) 144/80 128/80 132/80 132/60 --   Blood Pressure (Exit) 130/80 114/72 122/74 110/58 --   Heart Rate (Admit) 76 bpm 78 bpm 85 bpm 77 bpm --   Heart Rate (Exercise) 81 bpm 95 bpm 110 bpm 106 bpm --   Heart Rate (Exit) 70 bpm 83 bpm 78 bpm 85 bpm --   Oxygen Saturation (Admit) 98 % -- -- -- --   Oxygen Saturation (Exercise) 96 % -- -- -- --   Oxygen Saturation (Exit) 96 % -- -- -- --   Rating of Perceived Exertion (Exercise) 12 13 14 14  --   Perceived Dyspnea (Exercise) 1 -- -- -- --   Symptoms Bilateral hip pain 7/10 none none none --   Comments Results first full day of exercise -- -- --   Duration Progress to 30 minutes of  aerobic without signs/symptoms of physical distress Progress to 30 minutes of  aerobic without signs/symptoms of physical distress Progress to 30 minutes of  aerobic without signs/symptoms of physical distress Continue with 30  min of aerobic exercise without signs/symptoms of physical distress. --   Intensity THRR New THRR unchanged THRR unchanged THRR unchanged --     Progression   Progression Continue to progress workloads to maintain intensity without signs/symptoms of physical distress. Continue to progress workloads to maintain intensity without signs/symptoms of physical distress. Continue to progress workloads to maintain intensity without signs/symptoms of physical distress. Continue to progress workloads to maintain intensity without signs/symptoms of physical distress. --   Average METs 2.69 1.99 2.85 3.18 --      Resistance Training   Training Prescription -- Yes Yes Yes --   Weight -- 3 lb 3 lb 3 lb --   Reps -- 10-15 10-15 10-15 --     Interval Training   Interval Training -- No No No --     Treadmill   MPH -- -- 3 3.3 --   Grade -- -- 0 0 --   Minutes -- -- 15 15 --   METs -- -- 3.3 3.53 --     NuStep   Level -- -- 5 4  T6: 1 --   Minutes -- -- 15 15 --   METs -- -- 3.5 --  T6: 2.3 --     REL-XR   Level -- -- 2 -- --   Minutes -- -- 15 -- --   METs -- -- 4.4 -- --     T5 Nustep   Level -- 1 1 2  --   Minutes -- 15 15 15  --   METs -- 2.1 2.5 2.4 --     Track   Laps -- 16 16 -- --   Minutes -- 15 15 -- --   METs -- 1.87 1.87 -- --     Home Exercise Plan   Plans to continue exercise at -- -- -- -- Home (comment)  Treadmill and resistance training at home   Frequency -- -- -- -- Add 2 additional days to program exercise sessions.   Initial Home Exercises Provided -- -- -- -- 09/19/23     Oxygen   Maintain Oxygen Saturation -- -- 88% or higher 88% or higher 88% or higher    Row Name 09/27/23 1200 10/10/23 0800 10/22/23 1100         Response to Exercise   Blood Pressure (Admit) 118/60 102/58 118/60     Blood Pressure (Exercise) 102/56 -- --     Blood Pressure (Exit) 118/60 122/60 104/62     Heart Rate (Admit) 72 bpm 72 bpm 82 bpm     Heart Rate (Exercise) 129 bpm 118 bpm 106 bpm     Heart Rate (Exit) 91 bpm 74 bpm 70 bpm     Rating of Perceived Exertion (Exercise) 15 15 15      Symptoms none none none     Duration Continue with 30 min of aerobic exercise without signs/symptoms of physical distress. Continue with 30 min of aerobic exercise without signs/symptoms of physical distress. Continue with 30 min of aerobic exercise without signs/symptoms of physical distress.     Intensity THRR unchanged THRR unchanged THRR unchanged       Progression   Progression Continue to progress workloads to maintain intensity without signs/symptoms of physical distress. Continue to  progress workloads to maintain intensity without signs/symptoms of physical distress. Continue to progress workloads to maintain intensity without signs/symptoms of physical distress.     Average METs 3.78 3.99 5.01       Resistance Training   Training  Prescription Yes Yes Yes     Weight 3 lb 3 lb 3 lb     Reps 10-15 10-15 10-15       Interval Training   Interval Training No No No       Treadmill   MPH 3.2 3.2 3.3     Grade 1.5 2.5 6     Minutes 15 15 15      METs 4.11 4.55 6.25       NuStep   Level 4  T6 5 --     Minutes 15 15 --     METs 3 4.7 --       REL-XR   Level 5 5 7      Minutes 15 15 15      METs 4.2 4.1 4.5       T5 Nustep   Level -- 4  T6 --     Minutes -- 15 --     METs -- 2.5 --       Home Exercise Plan   Plans to continue exercise at Home (comment)  Treadmill and resistance training at home Home (comment)  Treadmill and resistance training at home Home (comment)  Treadmill and resistance training at home     Frequency Add 2 additional days to program exercise sessions. Add 2 additional days to program exercise sessions. Add 2 additional days to program exercise sessions.     Initial Home Exercises Provided 09/19/23 09/19/23 09/19/23       Oxygen   Maintain Oxygen Saturation 88% or higher 88% or higher 88% or higher              Exercise Comments:   Exercise Comments     Row Name 08/06/23 0908           Exercise Comments First full day of exercise!  Patient was oriented to gym and equipment including functions, settings, policies, and procedures.  Patient's individual exercise prescription and treatment plan were reviewed.  All starting workloads were established based on the results of the 6 minute walk test done at initial orientation visit.  The plan for exercise progression was also introduced and progression will be customized based on patient's performance and goals.                Exercise Goals and Review:   Exercise Goals     Row  Name 08/01/23 1053             Exercise Goals   Increase Physical Activity Yes       Intervention Provide advice, education, support and counseling about physical activity/exercise needs.;Develop an individualized exercise prescription for aerobic and resistive training based on initial evaluation findings, risk stratification, comorbidities and participant's personal goals.       Expected Outcomes Short Term: Attend rehab on a regular basis to increase amount of physical activity.;Long Term: Add in home exercise to make exercise part of routine and to increase amount of physical activity.;Long Term: Exercising regularly at least 3-5 days a week.       Increase Strength and Stamina Yes       Intervention Provide advice, education, support and counseling about physical activity/exercise needs.;Develop an individualized exercise prescription for aerobic and resistive training based on initial evaluation findings, risk stratification, comorbidities and participant's personal goals.       Expected Outcomes Short Term: Increase workloads from initial exercise prescription for resistance, speed, and METs.;Short Term: Perform resistance training exercises routinely during rehab and add in  resistance training at home;Long Term: Improve cardiorespiratory fitness, muscular endurance and strength as measured by increased METs and functional capacity ( )       Able to understand and use rate of perceived exertion (RPE) scale Yes       Intervention Provide education and explanation on how to use RPE scale       Expected Outcomes Short Term: Able to use RPE daily in rehab to express subjective intensity level;Long Term:  Able to use RPE to guide intensity level when exercising independently       Able to understand and use Dyspnea scale Yes       Intervention Provide education and explanation on how to use Dyspnea scale       Expected Outcomes Short Term: Able to use Dyspnea scale daily in rehab to express  subjective sense of shortness of breath during exertion;Long Term: Able to use Dyspnea scale to guide intensity level when exercising independently       Knowledge and understanding of Target Heart Rate Range (THRR) Yes       Intervention Provide education and explanation of THRR including how the numbers were predicted and where they are located for reference       Expected Outcomes Short Term: Able to state/look up THRR;Short Term: Able to use daily as guideline for intensity in rehab;Long Term: Able to use THRR to govern intensity when exercising independently       Able to check pulse independently Yes       Intervention Provide education and demonstration on how to check pulse in carotid and radial arteries.;Review the importance of being able to check your own pulse for safety during independent exercise       Expected Outcomes Short Term: Able to explain why pulse checking is important during independent exercise;Long Term: Able to check pulse independently and accurately       Understanding of Exercise Prescription Yes       Intervention Provide education, explanation, and written materials on patient's individual exercise prescription       Expected Outcomes Short Term: Able to explain program exercise prescription;Long Term: Able to explain home exercise prescription to exercise independently                Exercise Goals Re-Evaluation :  Exercise Goals Re-Evaluation     Row Name 08/06/23 0913 08/13/23 1722 08/23/23 0943 08/29/23 1151 09/14/23 0935     Exercise Goal Re-Evaluation   Exercise Goals Review Increase Physical Activity;Able to understand and use rate of perceived exertion (RPE) scale;Knowledge and understanding of Target Heart Rate Range (THRR);Understanding of Exercise Prescription;Increase Strength and Stamina;Able to check pulse independently Increase Physical Activity;Understanding of Exercise Prescription;Increase Strength and Stamina Increase Physical  Activity;Increase Strength and Stamina;Understanding of Exercise Prescription -- Increase Physical Activity;Increase Strength and Stamina;Understanding of Exercise Prescription   Comments Reviewed RPE and dyspnea scale, THR and program prescription with pt today.  Pt voiced understanding and was given a copy of goals to take home. Aunisty completed her first exercise session. She was able to complete 16 laps on the track and work at level 1 on the T5 nustep. We will continue to monitor her progress in the program. Damira is doing well here at rehab, on treadmill today at speed of 3. She reports that her hips hurt after ~53mins of walking but if she stops for a bit, the pain goes away. Reports that coming to rehab has helped her walk longer before the onset of pain.  Encouraged her to contineu to come to rehab and work on strength and stamina. Reminded her to comminicate with EP and rehab team if pain increases or last longer than usual. Baeleigh is doing well in rehab. She was able to increase her level on the T4 nustep from 2 to 5, and increase her level on the XR from 1 to 2. She was also able to increase her speed on the treadmill from to on no incline. We will continue to monitor her progress in the program. Lilias continues to do well in rehab. She recently increased her treadmill workload to a speed of 3.3 mph with no incline. She also improved to level 2 on the T5 nustep and continues to do well with 3 lb hand weights for resistance training. We will continue to monitor her progress in the program.   Expected Outcomes Short: Use RPE daily to regulate intensity.  Long: Follow program prescription in THR. Short: Continue to follow current exercise prescription. Long: Continue exercise to improve strength and stamina. STG: increase workload as able without hurting hips. LTG: continue to exercise to improve strength and stamina Short: Continue to increase treadmill workload. Long: Continue exercise to improve  strength and stamina. Short: Add incline to treadmill workload. Long: Continue exercise to improve strength and stamina.    Row Name 09/19/23 0935 09/27/23 1242 10/01/23 0956 10/10/23 0834 10/22/23 1146     Exercise Goal Re-Evaluation   Exercise Goals Review Increase Strength and Stamina;Understanding of Exercise Prescription;Able to understand and use Dyspnea scale;Increase Physical Activity;Knowledge and understanding of Target Heart Rate Range (THRR);Able to check pulse independently;Able to understand and use rate of perceived exertion (RPE) scale Increase Physical Activity;Understanding of Exercise Prescription;Increase Strength and Stamina Increase Physical Activity;Understanding of Exercise Prescription;Increase Strength and Stamina Increase Physical Activity;Understanding of Exercise Prescription;Increase Strength and Stamina Increase Physical Activity;Understanding of Exercise Prescription;Increase Strength and Stamina   Comments Reviewed home exercise with pt today.  Pt plans to walk on her treadmill at home for exercise. Reviewed THR, pulse, RPE, sign and symptoms, pulse oximetery and when to call 911 or MD.  Also discussed weather considerations and indoor options.  Pt voiced understanding. Hidaya is doing well in rehab. She was able to increase to 1.5% incline on the treadmill. She was also able to increase from level 2 to level 5 on the XR. Deyanna is walking 1-2 days a week at home on her TM on off days of rehab. She reports that she is tolerating this well and is most of the time getting 4-5 days a week of exercise at home. We reviewed some of the concepts from her home exercise guidelines and she was encouraged to buy a pulse ox to more easily monitor her heart rate during exercise. Jakaylah is doing well in the program. She was able to increase her incline on the treadmill from 1.5% to 2.5%. She was also able to increase from level 4 to level 5 on the T4 nustep. We will continue to monitor her  progress in the program. Shyrl continues to do well in the program. She recently increased her treadmill workload to a speed of 3.3 mph with an incline of 6%. She also improved to level 7 on the XR and continues to use 3 lb hand weights for resistance training. We will continue to monitor her progress in the program.   Expected Outcomes Short: Begin walking on treadmill on days away from rehab. Long: Continue to exercise independently. Short: Continue to  increase treadmill wokrload. Long: Continue exercise to improve strength and stamina. Short: buy a pulse ox to monitor HR during exercise at home. Continue to walk 1-2 days a week on TM at home on off days of rehab. Long: become independent with exerise routine upon graduation from cardiac rehab. Short: Continue to increase treadmill workload. Long: Continue exercise to improve strength and stamina. Short: Try 4 lb hand weights for resistance training. Long: Continue exercise to improve strength and stamina.            Discharge Exercise Prescription (Final Exercise Prescription Changes):  Exercise Prescription Changes - 10/22/23 1100       Response to Exercise   Blood Pressure (Admit) 118/60    Blood Pressure (Exit) 104/62    Heart Rate (Admit) 82 bpm    Heart Rate (Exercise) 106 bpm    Heart Rate (Exit) 70 bpm    Rating of Perceived Exertion (Exercise) 15    Symptoms none    Duration Continue with 30 min of aerobic exercise without signs/symptoms of physical distress.    Intensity THRR unchanged      Progression   Progression Continue to progress workloads to maintain intensity without signs/symptoms of physical distress.    Average METs 5.01      Resistance Training   Training Prescription Yes    Weight 3 lb    Reps 10-15      Interval Training   Interval Training No      Treadmill   MPH 3.3    Grade 6    Minutes 15    METs 6.25      REL-XR   Level 7    Minutes 15    METs 4.5      Home Exercise Plan   Plans to  continue exercise at Home (comment)   Treadmill and resistance training at home   Frequency Add 2 additional days to program exercise sessions.    Initial Home Exercises Provided 09/19/23      Oxygen   Maintain Oxygen Saturation 88% or higher             Nutrition:  Target Goals: Understanding of nutrition guidelines, daily intake of sodium 1500mg , cholesterol 200mg , calories 30% from fat and 7% or less from saturated fats, daily to have 5 or more servings of fruits and vegetables.  Education: All About Nutrition: -Group instruction provided by verbal, written material, interactive activities, discussions, models, and posters to present general guidelines for heart healthy nutrition including fat, fiber, MyPlate, the role of sodium in heart healthy nutrition, utilization of the nutrition label, and utilization of this knowledge for meal planning. Follow up email sent as well. Written material given at graduation. Flowsheet Row Cardiac Rehab from 10/17/2023 in [facility] Cardiac and Pulmonary Rehab  Education need identified 08/01/23  Date 10/17/23  Educator JG part 2  Instruction Review Code 1- Verbalizes Understanding       Biometrics:  Pre Biometrics - 08/01/23 1054       Pre Biometrics   Height 5' 4.5" (1.638 m)    Weight 145 lb 11.2 oz (66.1 kg)    Waist Circumference 37.5 inches    Hip Circumference 39.5 inches    Waist to Hip Ratio 0.95 %    BMI (Calculated) 24.63    Single Leg Stand 30 seconds              Nutrition Therapy Plan and Nutrition Goals:  Nutrition Therapy & Goals - 08/27/23 1419  Nutrition Therapy   Diet Cardiac, Low Na    Protein (specify units) 75-90    Fiber 25 grams    Whole Grain Foods 3 servings    Saturated Fats 15 max. grams    Fruits and Vegetables 5 servings/day    Sodium 2 grams      Personal Nutrition Goals   Nutrition Goal Eat 15-30gProtein and 30-60gCarbs at each meal.    Personal Goal #2 Read labels and reduce sodium  intake to below 2300mg . Ideally 1500mg  per day.    Personal Goal #3 Reduce saturated fat, less than 12g per day. Replace bad fats for more heart healthy fats.    Comments Patient drinking ~48oz of water. Some juice in small quantities. Rarely a tea or soda. She usually eats 3 times per day, but often eating out or getting pickup/delivery. Reviewed mediterranean diet handout, spoke about salt and sodium limits of below 1500mg  daily. Reviewed ways to cut back and stay below the limit most days. Educated on types of fats, sources, and how to read labels. Reviewed healthy plate sample handout and discussed the importance of balanced meals with both protein and complex carbs in controlled portions and colorful produce to stay full.      Intervention Plan   Intervention Prescribe, educate and counsel regarding individualized specific dietary modifications aiming towards targeted core components such as weight, hypertension, lipid management, diabetes, heart failure and other comorbidities.;Nutrition handout(s) given to patient.    Expected Outcomes Short Term Goal: Understand basic principles of dietary content, such as calories, fat, sodium, cholesterol and nutrients.;Short Term Goal: A plan has been developed with personal nutrition goals set during dietitian appointment.;Long Term Goal: Adherence to prescribed nutrition plan.             Nutrition Assessments:  MEDIFICTS Score Key: >=70 Need to make dietary changes  40-70 Heart Healthy Diet <= 40 Therapeutic Level Cholesterol Diet  Flowsheet Row Cardiac Rehab from 08/01/2023 in [facility] Cardiac and Pulmonary Rehab  Picture Your Plate Total Score on Admission 64      Picture Your Plate Scores: <16 Unhealthy dietary pattern with much room for improvement. 41-50 Dietary pattern unlikely to meet recommendations for good health and room for improvement. 51-60 More healthful dietary pattern, with some room for improvement.  >60 Healthy dietary  pattern, although there may be some specific behaviors that could be improved.    Nutrition Goals Re-Evaluation:  Nutrition Goals Re-Evaluation     Row Name 08/23/23 0935 09/24/23 0950 10/17/23 0933         Goals   Comment She reports she is doing well with eating fruits but feels she could do better with eating more veggies. Commended her on including fruit and colorful produce in her diet. Spoke about her improving appetite and priortizing quality over quantity. Will do full assessment next week Aayat reports that she is trying to read food labels and be aware of  her sodium and saturated fat intake. She reports that her kidney funtion has been a concern and she started a new medication that with her research she feels she needs to limit protein intake. She was encouraged to talk to her doctor or phamacist to get clear guidance on what she can eat while taking this medication. Prosperity reports that she is doing well with her diet at this time. She is especially focusing on reducing sodium and increasing protein intake. She is trying to reduce saturated fat but states that fried foods are her favorite  foods. She also states that she is drinking plenty of water.     Expected Outcome STG: Include more veggies and attend RD appointment next week. LTG: follow heart healthy lifestyle Short: follow up with doctor to get guidance on protein intake with new meds. Long: maintain heart healthy diet. Short: Continue to limit saturated fat. Long: Continue to practice heart healthy eating patterns.              Nutrition Goals Discharge (Final Nutrition Goals Re-Evaluation):  Nutrition Goals Re-Evaluation - 10/17/23 0933       Goals   Comment Lorely reports that she is doing well with her diet at this time. She is especially focusing on reducing sodium and increasing protein intake. She is trying to reduce saturated fat but states that fried foods are her favorite foods. She also states that she is drinking  plenty of water.    Expected Outcome Short: Continue to limit saturated fat. Long: Continue to practice heart healthy eating patterns.             Psychosocial: Target Goals: Acknowledge presence or absence of significant depression and/or stress, maximize coping skills, provide positive support system. Participant is able to verbalize types and ability to use techniques and skills needed for reducing stress and depression.   Education: Stress, Anxiety, and Depression - Group verbal and visual presentation to define topics covered.  Reviews how body is impacted by stress, anxiety, and depression.  Also discusses healthy ways to reduce stress and to treat/manage anxiety and depression.  Written material given at graduation.   Education: Sleep Hygiene -Provides group verbal and written instruction about how sleep can affect your health.  Define sleep hygiene, discuss sleep cycles and impact of sleep habits. Review good sleep hygiene tips.    Initial Review & Psychosocial Screening:  Initial Psych Review & Screening - 06/18/23 0944       Initial Review   Current issues with None Identified      Family Dynamics   Good Support System? Yes   husband  Thayer Ohm     Barriers   Psychosocial barriers to participate in program There are no identifiable barriers or psychosocial needs.      Screening Interventions   Interventions Encouraged to exercise;To provide support and resources with identified psychosocial needs;Provide feedback about the scores to participant    Expected Outcomes Short Term goal: Utilizing psychosocial counselor, staff and physician to assist with identification of specific Stressors or current issues interfering with healing process. Setting desired goal for each stressor or current issue identified.;Long Term Goal: Stressors or current issues are controlled or eliminated.;Short Term goal: Identification and review with participant of any Quality of Life or Depression  concerns found by scoring the questionnaire.;Long Term goal: The participant improves quality of Life and PHQ9 Scores as seen by post scores and/or verbalization of changes             Quality of Life Scores:   Quality of Life - 08/01/23 1054       Quality of Life   Select Quality of Life      Quality of Life Scores   Health/Function Pre 22.5 %    Socioeconomic Pre 24.69 %    Psych/Spiritual Pre 25.36 %    Family Pre 30 %    GLOBAL Pre 24.64 %            Scores of 19 and below usually indicate a poorer quality of life in these areas.  A difference of  2-3 points is a clinically meaningful difference.  A difference of 2-3 points in the total score of the Quality of Life Index has been associated with significant improvement in overall quality of life, self-image, physical symptoms, and general health in studies assessing change in quality of life.  PHQ-9: Review Flowsheet       08/01/2023  Depression screen PHQ 2/9  Decreased Interest 0  Down, Depressed, Hopeless 0  PHQ - 2 Score 0  Altered sleeping 1  Tired, decreased energy 1  Change in appetite 0  Feeling bad or failure about yourself  0  Trouble concentrating 0  Moving slowly or fidgety/restless 0  Suicidal thoughts 0  PHQ-9 Score 2  Difficult doing work/chores Not difficult at all   Interpretation of Total Score  Total Score Depression Severity:  1-4 = Minimal depression, 5-9 = Mild depression, 10-14 = Moderate depression, 15-19 = Moderately severe depression, 20-27 = Severe depression   Psychosocial Evaluation and Intervention:  Psychosocial Evaluation - 06/18/23 0956       Psychosocial Evaluation & Interventions   Interventions Encouraged to exercise with the program and follow exercise prescription    Comments Sandeep has no barriers to attending the program. She lives with her husband and he is her support person.  She is ready to start the program after her home PT.    Expected Outcomes STG attends all  scheduled sessions  LTG utilizes education and resources obtained during the program to contineu her healthy lifestyle steps.    Continue Psychosocial Services  Follow up required by staff             Psychosocial Re-Evaluation:  Psychosocial Re-Evaluation     Row Name 08/23/23 0940 09/24/23 0946 10/17/23 0927         Psychosocial Re-Evaluation   Current issues with None Identified None Identified None Identified     Comments Jailah reports no depression, anxiety or stress. Says she has a good support system with her husband, friends and church. She says she wakes up a few times in the middle of the night but gets restful sleep and wakes up well rested most mornings. Kiyani reports no sleep or mental health concerns. She does have some health concerns with her kidney funtion related to a medication she was taking. She has been working closely with her doctor to adjust her meds and get to the root cause of what is going on. She reports that she has a good support system. Kynnedi reports no major stressors at this time. She enjoys watching HGTV and getting together with friends and family. She also enjoys attending church and traveling for stress relief. She also states that she is sleeping well at this time. Naliah states that her husband and friends make a good support system for her.     Expected Outcomes STG: continue to attend rehab and use support systems as needed. LTG: maintain positive outlook on health and daily life Short: continue to follow up with doctor closely about kidney health that his causing her some concern. Long: maintian good mental health routine. Short: Continue to relieve stress through healthy avenues. Long: Maintain positive outlook.     Interventions Encouraged to attend Cardiac Rehabilitation for the exercise Encouraged to attend Cardiac Rehabilitation for the exercise Encouraged to attend Cardiac Rehabilitation for the exercise     Continue Psychosocial Services  Follow  up required by staff Follow up required by staff Follow up required by staff  Psychosocial Discharge (Final Psychosocial Re-Evaluation):  Psychosocial Re-Evaluation - 10/17/23 0927       Psychosocial Re-Evaluation   Current issues with None Identified    Comments Syla reports no major stressors at this time. She enjoys watching HGTV and getting together with friends and family. She also enjoys attending church and traveling for stress relief. She also states that she is sleeping well at this time. Vena states that her husband and friends make a good support system for her.    Expected Outcomes Short: Continue to relieve stress through healthy avenues. Long: Maintain positive outlook.    Interventions Encouraged to attend Cardiac Rehabilitation for the exercise    Continue Psychosocial Services  Follow up required by staff             Vocational Rehabilitation: Provide vocational rehab assistance to qualifying candidates.   Vocational Rehab Evaluation & Intervention:  Vocational Rehab - 06/18/23 0946       Initial Vocational Rehab Evaluation & Intervention   Assessment shows need for Vocational Rehabilitation No      Vocational Rehab Re-Evaulation   Comments retired             Education: Education Goals: Education classes will be provided on a variety of topics geared toward better understanding of heart health and risk factor modification. Participant will state understanding/return demonstration of topics presented as noted by education test scores.  Learning Barriers/Preferences:  Learning Barriers/Preferences - 06/18/23 0945       Learning Barriers/Preferences   Learning Barriers Hearing   wears hearing aid   Learning Preferences None             General Cardiac Education Topics:  AED/CPR: - Group verbal and written instruction with the use of models to demonstrate the basic use of the AED with the basic ABC's of  resuscitation.   Anatomy and Cardiac Procedures: - Group verbal and visual presentation and models provide information about basic cardiac anatomy and function. Reviews the testing methods done to diagnose heart disease and the outcomes of the test results. Describes the treatment choices: Medical Management, Angioplasty, or Coronary Bypass Surgery for treating various heart conditions including Myocardial Infarction, Angina, Valve Disease, and Cardiac Arrhythmias.  Written material given at graduation. Flowsheet Row Cardiac Rehab from 10/17/2023 in [facility] Cardiac and Pulmonary Rehab  Date 08/15/23  Educator sb  Instruction Review Code 1- Verbalizes Understanding       Medication Safety: - Group verbal and visual instruction to review commonly prescribed medications for heart and lung disease. Reviews the medication, class of the drug, and side effects. Includes the steps to properly store meds and maintain the prescription regimen.  Written material given at graduation.   Intimacy: - Group verbal instruction through game format to discuss how heart and lung disease can affect sexual intimacy. Written material given at graduation.. Flowsheet Row Cardiac Rehab from 10/17/2023 in [facility] Cardiac and Pulmonary Rehab  Date 10/03/23  Educator NT  Instruction Review Code 1- Verbalizes Understanding       Know Your Numbers and Heart Failure: - Group verbal and visual instruction to discuss disease risk factors for cardiac and pulmonary disease and treatment options.  Reviews associated critical values for Overweight/Obesity, Hypertension, Cholesterol, and Diabetes.  Discusses basics of heart failure: signs/symptoms and treatments.  Introduces Heart Failure Zone chart for action plan for heart failure.  Written material given at graduation. Flowsheet Row Cardiac Rehab from 10/17/2023 in [facility] Cardiac and Pulmonary Rehab  Date 08/29/23  Educator [facility]  Instruction Review Code 1- Verbalizes Understanding        Infection Prevention: - Provides verbal and written material to individual with discussion of infection control including proper hand washing and proper equipment cleaning during exercise session. Flowsheet Row Cardiac Rehab from 10/17/2023 in [facility] Cardiac and Pulmonary Rehab  Date 08/01/23  Educator MB  Instruction Review Code 1- Verbalizes Understanding       Falls Prevention: - Provides verbal and written material to individual with discussion of falls prevention and safety. Flowsheet Row Cardiac Rehab from 10/17/2023 in [facility] Cardiac and Pulmonary Rehab  Date 08/01/23  Educator MB  Instruction Review Code 1- Verbalizes Understanding       Other: -Provides group and verbal instruction on various topics (see comments)   Knowledge Questionnaire Score:  Knowledge Questionnaire Score - 08/01/23 1059       Knowledge Questionnaire Score   Pre Score 25/26             Core Components/Risk Factors/Patient Goals at Admission:  Personal Goals and Risk Factors at Admission - 08/01/23 1059       Core Components/Risk Factors/Patient Goals on Admission    Weight Management Yes;Weight Maintenance    Intervention Weight Management: Develop a combined nutrition and exercise program designed to reach desired caloric intake, while maintaining appropriate intake of nutrient and fiber, sodium and fats, and appropriate energy expenditure required for the weight goal.;Weight Management: Provide education and appropriate resources to help participant work on and attain dietary goals.    Admit Weight 145 lb 11.2 oz (66.1 kg)    Goal Weight: Short Term 145 lb (65.8 kg)    Goal Weight: Long Term 145 lb (65.8 kg)    Expected Outcomes Short Term: Continue to assess and modify interventions until short term weight is achieved;Long Term: Adherence to nutrition and physical activity/exercise program aimed toward attainment of established weight goal;Weight Maintenance: Understanding of the daily  nutrition guidelines, which includes 25-35% calories from fat, 7% or less cal from saturated fats, less than 200mg  cholesterol, less than 1.5gm of sodium, & 5 or more servings of fruits and vegetables daily;Understanding recommendations for meals to include 15-35% energy as protein, 25-35% energy from fat, 35-60% energy from carbohydrates, less than 200mg  of dietary cholesterol, 20-35 gm of total fiber daily;Understanding of distribution of calorie intake throughout the day with the consumption of 4-5 meals/snacks    Hypertension Yes    Intervention Provide education on lifestyle modifcations including regular physical activity/exercise, weight management, moderate sodium restriction and increased consumption of fresh fruit, vegetables, and low fat dairy, alcohol moderation, and smoking cessation.;Monitor prescription use compliance.    Expected Outcomes Short Term: Continued assessment and intervention until BP is < 140/7mm HG in hypertensive participants. < 130/97mm HG in hypertensive participants with diabetes, heart failure or chronic kidney disease.;Long Term: Maintenance of blood pressure at goal levels.             Education:Diabetes - Individual verbal and written instruction to review signs/symptoms of diabetes, desired ranges of glucose level fasting, after meals and with exercise. Acknowledge that pre and post exercise glucose checks will be done for 3 sessions at entry of program.   Core Components/Risk Factors/Patient Goals Review:   Goals and Risk Factor Review     Row Name 08/23/23 0931 09/24/23 0981 10/17/23 0935         Core Components/Risk Factors/Patient Goals Review   Personal Goals Review Weight Management/Obesity Hypertension Weight Management/Obesity;Hypertension  Review She reports she lost her appetite after her surgery and lost ~15lbs. But she has since regained her appetite and stablized her weight. Spoke about ensuring her body is strong regardless of the  number on the scale. She agrees and will continue to attend rehab to improve strength and stamina. Pamalee states that she does monitor BP at home but gets much higher numbers then we have been getting while monitoring it in cardiac rehab class. She was concerned about her personal BP cuff's accuracy and she was encouraged to bring it into class so we could check in against the BP we are taking in class. Jahnasia states that she is comfortable with where her weight is at this time. She is not actively trying to lose weight but states that she would not mind losing a few pounds as a by product of her diet and exercise. She states that her BP has stayed within normal ranges as she is checking it once a day shortly after taking her medications. She reports continuing taking all of her medications as prescribed.     Expected Outcomes STG: attend rehab and meet nutrtional needs to maintain weight. LTG: achieve and maintain healthy weight Short: bring BP cuff into class for staff to check accuacy. Long: maintian good BP and continue to take meds and follow up with doctor if there are any concerns. Short: Continue to monitor BP at home. Long: Continue to manage lifestyle risk factors.              Core Components/Risk Factors/Patient Goals at Discharge (Final Review):   Goals and Risk Factor Review - 10/17/23 0935       Core Components/Risk Factors/Patient Goals Review   Personal Goals Review Weight Management/Obesity;Hypertension    Review Metztli states that she is comfortable with where her weight is at this time. She is not actively trying to lose weight but states that she would not mind losing a few pounds as a by product of her diet and exercise. She states that her BP has stayed within normal ranges as she is checking it once a day shortly after taking her medications. She reports continuing taking all of her medications as prescribed.    Expected Outcomes Short: Continue to monitor BP at home. Long:  Continue to manage lifestyle risk factors.             ITP Comments:  ITP Comments     Row Name 06/18/23 0954 08/01/23 1047 08/06/23 0907 08/29/23 1140 09/26/23 1043   ITP Comments Virtual orientation call completed today. shehas an appointment on Date: will schedule for Jan after [facility] PT completed  for EP eval and gym Orientation.  Documentation of diagnosis can be found in [facility] Date: 05/22/2023 . Completed and gym orientation. Initial ITP created and sent for review to Dr. Bethann Punches, Medical Director. First full day of exercise!  Patient was oriented to gym and equipment including functions, settings, policies, and procedures.  Patient's individual exercise prescription and treatment plan were reviewed.  All starting workloads were established based on the results of the 6 minute walk test done at initial orientation visit.  The plan for exercise progression was also introduced and progression will be customized based on patient's performance and goals. 30 Day review completed. Medical Director ITP review done, changes made as directed, and signed approval by Medical Director.    new to program 30 Day review completed. Medical Director ITP review done, changes made as directed, and signed  approval by Medical Director.    Row Name 10/24/23 0927           ITP Comments 30 Day review completed. Medical Director ITP review done, changes made as directed, and signed approval by Medical Director.                Comments:

## 2023-10-31 NOTE — Progress Notes (Unsigned)
 Patient ID: [name]                 DOB: 08-25-1955                      MRN: [id number]      HPI: [name] is a 68 y.o. female referred by Dr. Izora Ribas to HTN clinic. PMH is significant for type A aortic dissection, hypertension, and obstructive sleep apnea.   surgery for the aortic dissection on May 21, 2024. Post surgery patient was put on losartan 50 mg daily. At office visit with Dr.Chandrasekhar on 08/02/23 BP was 147/89 losartan dose was increased to 100 mg daily. On 08/25/23 metoprolol was changed to carvedilol 12.5 mg twice daily for better BP control.  Pt last seen in clinic 09/13/23. Reported she feels fine except her BP at home varies a lot. She gets dizzy only when she moves around fast doing her household chores. She denies for SOB at rest, chest pain, swelling or headaches. She has 2 BP monitors (wrist and arm cuffs, prefers wrist). Home BP readings ~120-125/75-85 range highest 146/85 and lowest 112/68. Heart rate remains in 65-70 range. Wrist cuff found to be inaccurate. Office BP 118/80 mmHg, no changes to regimen made, patient to bring in readings to next visit and arm cuff for validation.   1st on home  wrist machine - 112/69 heart rate 66  1st on office cuff 120/80 heart rate 67  2nd on home wrist machine - 126/75 heart rate 67  2nd on office cuff 118/80 heart rate 67   09/17/23 Spoke with patient regarding BMP results. Scr increased from 1.24 > 1.67. Stopped losartan and replaced with amlodipine 5 mg. Will repeat BMP in 4 weeks.   Patient contacted clinic on MyChart on 09/30/23, indicates she has been off losartan x 2 weeks and asks about labs f/u. She reports wearing arm cuff incorrectly and elevated readings since improved technique. Reports home readings 100s-120s/70s-80s with some SBPs in low 130s later in the week. No changes made.  Morning home BP low 100/high 50's feels lathargic but evening BP some time goesup to 130/90 range   115/68 heart rate  64  115/64  heart rate 64 Current HTN meds: carvedilol 12.5 mg twice daily, amlodipine 5mg  , Maxzide 75-50 mg daily  Previously tried: metoprolol 50 mg twice daily, losartan 100 mg - SrCr went up  BP goal: <130/80  Social History:  Alcohol: 1 std drink per week  Smoking: quit at age of 10  Diet: eat out a lot - 5 dinners per week  Suggest to cut down on eating out - cook with very little or no salt at home   Exercise: cardio rehab- 3 times a week    Home BP readings:    Wt Readings from Last 3 Encounters:  08/02/23 143 lb (64.9 kg)  08/01/23 145 lb 11.2 oz (66.1 kg)  05/22/23 158 lb (71.7 kg)   BP Readings from Last 3 Encounters:  09/13/23 118/80  08/02/23 (!) 147/89  05/22/23 104/71   Pulse Readings from Last 3 Encounters:  09/13/23 67  08/02/23 69  05/22/23 (!) 59    Renal function: CrCl cannot be calculated (Unknown ideal weight.).  Past Medical History:  Diagnosis Date   Actinic keratosis    Arthritis    Diverticulosis    Dysplastic nevus 09/27/2009   Left post waistline. Mild atypia. Limited margins free.   History of gallstones  History of hiatal hernia    Hx of basal cell carcinoma 06/18/2012   L bra line infrascapular   Hx of squamous cell carcinoma of skin    multiple sites   Hypertension    Peptic ulcer disease 1991   Sleep apnea    cpap   Squamous cell carcinoma in situ (SCCIS) 04/26/2022   left abdomen ED&C 07/03/22   Squamous cell carcinoma of skin 05/07/2007   Right medial superior breast. SCCis   Squamous cell carcinoma of skin 06/09/2013   Right medial calf. WD SCC. Recurrent 07/28/2013. Tx: EDC   Squamous cell carcinoma of skin 01/13/2014   Left medial pretibial. SCC, KA pattern   Squamous cell carcinoma of skin 09/06/2016   Left medial lower leg. WD SCC   Squamous cell carcinoma of skin 11/07/2017   Right distal pretibial. WD SCC. Tx: EDC   Squamous cell carcinoma of skin 07/03/2022   Left pretibia. EDC    Current Outpatient  Medications on File Prior to Visit  Medication Sig Dispense Refill   amLODipine (NORVASC) 5 MG tablet Take 1 tablet (5 mg total) by mouth daily. 180 tablet 3   amoxicillin (AMOXIL) 500 MG tablet Take 30 min-1 hour prior to all dental work 4 tablet 0   aspirin EC 81 MG tablet Take 81 mg by mouth daily. Swallow whole.     calcium carbonate (OSCAL) 1500 (600 Ca) MG TABS tablet Take 600 mg of elemental calcium by mouth daily.     carvedilol (COREG) 12.5 MG tablet Take 1 tablet (12.5 mg total) by mouth 2 (two) times daily. 180 tablet 3   Cholecalciferol (VITAMIN D3 SUPER STRENGTH) 50 MCG (2000 UT) CAPS Take by mouth daily at 6 (six) AM.     cholestyramine light (PREVALITE) 4 g packet Take 4 g by mouth 2 (two) times daily.     Glucosamine Sulfate 500 MG TABS Take by mouth as needed (joint pain).     Niacin (VITAMIN B-3 PO) Take 1 tablet by mouth daily.     NON FORMULARY Pt uses a cpap nightly     POTASSIUM PO Take 2 tablets by mouth daily.     triamterene-hydrochlorothiazide (MAXZIDE) 75-50 MG tablet Take 1 tablet by mouth daily.     No current facility-administered medications on file prior to visit.    Allergies  Allergen Reactions   Levaquin [Levofloxacin]     Aortic dissection hx   Vicodin [Hydrocodone-Acetaminophen] Itching    There were no vitals taken for this visit.   Assessment/Plan:  1. Hypertension -  No problem-specific Assessment & Plan notes found for this encounter.    Thank you  Nickola Baron, Pharm.D [facility] A Division of [facility] [address] [place], [place] [address]  Phone: [phone]; Fax: [phone]

## 2023-11-01 ENCOUNTER — Ambulatory Visit: Payer: Medicare Other | Attending: Internal Medicine | Admitting: Pharmacist

## 2023-11-01 ENCOUNTER — Encounter: Payer: Self-pay | Admitting: Pharmacist

## 2023-11-01 VITALS — BP 115/64 | HR 64

## 2023-11-01 DIAGNOSIS — I10 Essential (primary) hypertension: Secondary | ICD-10-CM | POA: Diagnosis not present

## 2023-11-01 LAB — BASIC METABOLIC PANEL WITH GFR
BUN/Creatinine Ratio: 17 (ref 12–28)
BUN: 18 mg/dL (ref 8–27)
CO2: 26 mmol/L (ref 20–29)
Calcium: 9.6 mg/dL (ref 8.7–10.3)
Chloride: 98 mmol/L (ref 96–106)
Creatinine, Ser: 1.05 mg/dL — ABNORMAL HIGH (ref 0.57–1.00)
Glucose: 88 mg/dL (ref 70–99)
Potassium: 3.5 mmol/L (ref 3.5–5.2)
Sodium: 140 mmol/L (ref 134–144)
eGFR: 58 mL/min/{1.73_m2} — ABNORMAL LOW (ref >59)

## 2023-11-01 MED ORDER — AMLODIPINE BESYLATE 2.5 MG PO TABS: 2.5000 mg | ORAL_TABLET | Freq: Two times a day (BID) | ORAL | 3 refills | Status: DC

## 2023-11-01 NOTE — Progress Notes (Signed)
 Patient ID: [name]                 DOB: Jan 30, 1956                      MRN: [id number]      HPI: JOHNANNA Berry is a 68 y.o. female referred by Dr. Paulita Boss to HTN clinic. PMH is significant for type A aortic dissection, hypertension, and obstructive sleep apnea.   surgery for the aortic dissection on May 21, 2024. Post surgery patient was put on losartan 50 mg daily. At office visit with Dr.Chandrasekhar on 08/02/23 BP was 147/89 losartan dose was increased to 100 mg daily. On 08/25/23 metoprolol was changed to carvedilol 12.5 mg twice daily for better BP control.  Pt last seen in clinic 09/13/23. Reported she feels fine except her BP at home varies a lot. She gets dizzy only when she moves around fast doing her household chores. She denies for SOB at rest, chest pain, swelling or headaches. She has 2 BP monitors (wrist and arm cuffs, prefers wrist). Home BP readings ~120-125/75-85 range highest 146/85 and lowest 112/68. Heart rate remains in 65-70 range. Wrist cuff found to be inaccurate. Office BP 118/80 mmHg, no changes to regimen made, patient to bring in readings to next visit and arm cuff for validation.   1st on home  wrist machine - 112/69 heart rate 66  1st on office cuff 120/80 heart rate 67  2nd on home wrist machine - 126/75 heart rate 67  2nd on office cuff 118/80 heart rate 67   09/17/23 Spoke with patient regarding BMP results. Scr increased from 1.24 > 1.67. Stopped losartan and replaced with amlodipine 5 mg. F/u BMP SrCr went back to baseline K level was slightly lower pt has increased her otc k supplement   Patient presented today for BP follow up. Reports her morning home BP ~ low 100/high 50's feels lathargic but evening BP some time go up to 130/90 range. In the morning she feels tired ( not dizzy can't explain how she feels) when her BP stays in low 100's. Follows low salt diet and go to rehab 2-3 times per week. Was on vacation was getting out of breath  when climbing stairs or hills. Brought in 2nd home BP monitor for validation found out to be accurate  1st reading on home cuff 115/68 heart rate 64  1st reading on office cuff 115/64  heart rate 64  Current HTN meds: carvedilol 12.5 mg twice daily, amlodipine 5mg  , Maxzide 75-50 mg daily  Previously tried: metoprolol 50 mg twice daily, losartan 100 mg - SrCr went up  BP goal: <130/80  Social History:  Alcohol: 1 std drink per week  Smoking: quit at age of 2  Diet: eat out a lot - 5 dinners per week  Suggest to cut down on eating out - cook with very little or no salt at home   Exercise: cardio rehab- 3 times a week    Home BP readings:    Wt Readings from Last 3 Encounters:  08/02/23 143 lb (64.9 kg)  08/01/23 145 lb 11.2 oz (66.1 kg)  05/22/23 158 lb (71.7 kg)   BP Readings from Last 3 Encounters:  11/01/23 115/64  09/13/23 118/80  08/02/23 (!) 147/89   Pulse Readings from Last 3 Encounters:  11/01/23 64  09/13/23 67  08/02/23 69    Renal function: CrCl cannot be calculated (Unknown ideal weight.).  Past Medical History:  Diagnosis Date   Actinic keratosis    Arthritis    Diverticulosis    Dysplastic nevus 09/27/2009   Left post waistline. Mild atypia. Limited margins free.   History of gallstones    History of hiatal hernia    Hx of basal cell carcinoma 06/18/2012   L bra line infrascapular   Hx of squamous cell carcinoma of skin    multiple sites   Hypertension    Peptic ulcer disease 1991   Sleep apnea    cpap   Squamous cell carcinoma in situ (SCCIS) 04/26/2022   left abdomen ED&C 07/03/22   Squamous cell carcinoma of skin 05/07/2007   Right medial superior breast. SCCis   Squamous cell carcinoma of skin 06/09/2013   Right medial calf. WD SCC. Recurrent 07/28/2013. Tx: EDC   Squamous cell carcinoma of skin 01/13/2014   Left medial pretibial. SCC, KA pattern   Squamous cell carcinoma of skin 09/06/2016   Left medial lower leg. WD SCC   Squamous  cell carcinoma of skin 11/07/2017   Right distal pretibial. WD SCC. Tx: EDC   Squamous cell carcinoma of skin 07/03/2022   Left pretibia. EDC    Current Outpatient Medications on File Prior to Visit  Medication Sig Dispense Refill   amoxicillin (AMOXIL) 500 MG tablet Take 30 min-1 hour prior to all dental work 4 tablet 0   aspirin EC 81 MG tablet Take 81 mg by mouth daily. Swallow whole.     calcium carbonate (OSCAL) 1500 (600 Ca) MG TABS tablet Take 600 mg of elemental calcium by mouth daily.     carvedilol (COREG) 12.5 MG tablet Take 1 tablet (12.5 mg total) by mouth 2 (two) times daily. 180 tablet 3   Cholecalciferol (VITAMIN D3 SUPER STRENGTH) 50 MCG (2000 UT) CAPS Take by mouth daily at 6 (six) AM.     cholestyramine light (PREVALITE) 4 g packet Take 4 g by mouth 2 (two) times daily.     Glucosamine Sulfate 500 MG TABS Take by mouth as needed (joint pain).     Niacin (VITAMIN B-3 PO) Take 1 tablet by mouth daily.     NON FORMULARY Pt uses a cpap nightly     POTASSIUM PO Take 2 tablets by mouth daily.     triamterene-hydrochlorothiazide (MAXZIDE) 75-50 MG tablet Take 1 tablet by mouth daily.     No current facility-administered medications on file prior to visit.    Allergies  Allergen Reactions   Levaquin [Levofloxacin]     Aortic dissection hx   Vicodin [Hydrocodone-Acetaminophen] Itching    Blood pressure 115/64, pulse 64, SpO2 98%.   Assessment/Plan:  1. Hypertension -  Hypertension Assessment: BP is controlled in office BP 115/64  mmHg heart rate 64 (goal<130/80) Takes and tolerates current BP medications well except tiredness and low BP during morning hours  Home arm cuff validated- found out to be accurate  morning home BP ~ low 100/high 50's feels lathargic but evening BP some time go up to 130/90 range Last BMP renal function back to baseline post losartan discontinuation  mild SOB with extortion -climbing stairs and brisk walking   Denies SOB at rest,  palpitation, chest pain, headaches,or swelling Follows low salt diet  Cardiac rehab- 3 times per week   Plan:  Start taking amlodipine 2.5 mg twice daily instead of 5 mg every morning to avoid too low BP during morning hours  Continue taking carvedilol 12.5 mg twice daily, Maxzide 75-50  mg daily  Next f/u with NP 12/18/23  Phone follow up due in 1 month from PharmD  Follow up lab(s) : BMP to evaluate K level and SrCr     Thank you  Nickola Baron, Pharm.D [facility] A Division of  [facility] [address], [place], [place] [address]  Phone: [phone]; Fax: [phone]

## 2023-11-01 NOTE — Assessment & Plan Note (Signed)
 Assessment: BP is controlled in office BP 115/64  mmHg heart rate 64 (goal<130/80) Takes and tolerates current BP medications well except tiredness and low BP during morning hours  Home arm cuff validated- found out to be accurate  morning home BP ~ low 100/high 50's feels lathargic but evening BP some time go up to 130/90 range Last BMP renal function back to baseline post losartan discontinuation  mild SOB with extortion -climbing stairs and brisk walking   Denies SOB at rest, palpitation, chest pain, headaches,or swelling Follows low salt diet  Cardiac rehab- 3 times per week   Plan:  Start taking amlodipine 2.5 mg twice daily instead of 5 mg every morning to avoid too low BP during morning hours  Continue taking carvedilol 12.5 mg twice daily, Maxzide 75-50 mg daily  Next f/u with NP 12/18/23  Phone follow up due in 1 month from PharmD  Follow up lab(s) : BMP to evaluate K level and SrCr

## 2023-11-02 ENCOUNTER — Encounter: Payer: Self-pay | Admitting: Pharmacist

## 2023-11-02 ENCOUNTER — Encounter: Admitting: *Deleted

## 2023-11-02 DIAGNOSIS — Z9889 Other specified postprocedural states: Secondary | ICD-10-CM

## 2023-11-02 NOTE — Progress Notes (Signed)
 Daily Session Note  Patient Details  Name: [name] MRN: [id number] Date of Birth: [birth year] Referring Provider:   Flowsheet Row Cardiac Rehab from 08/01/2023 in [facility] Cardiac and Pulmonary Rehab  Referring Provider Rae Bugler, MD       Encounter Date: 11/02/2023  Check In:  Session Check In - 11/02/23 1021       Check-In   Supervising physician immediately available to respond to emergencies See telemetry face sheet for immediately available ER MD    Location [facility]-Cardiac & Pulmonary Rehab    Staff Present Sherle Dire, BS, Exercise Physiologist;Kelly Sabra Cramp BS, ACSM CEP, Exercise Physiologist;Maxon Conetta BS, Exercise Physiologist;Nikelle Malatesta RN,BSN    Virtual Visit No    Medication changes reported     No    Fall or balance concerns reported    No    Warm-up and Cool-down Performed on first and last piece of equipment    Resistance Training Performed Yes    VAD Patient? No    PAD/SET Patient? No      Pain Assessment   Currently in Pain? No/denies    Multiple Pain Sites No                Social History   Tobacco Use  Smoking Status Former   Current packs/day: 0.00   Types: Cigarettes   Quit date: 02/13/1974   Years since quitting: 49.7  Smokeless Tobacco Never    Goals Met:  Independence with exercise equipment Exercise tolerated well No report of concerns or symptoms today Strength training completed today  Goals Unmet:  Not Applicable  Comments: Pt able to follow exercise prescription today without complaint.  Will continue to monitor for progression.     Dr. Firman Hughes is Medical Director for [facility] Cardiac Rehabilitation.  Dr. Fuad Aleskerov is Medical Director for [facility] Pulmonary Rehabilitation.

## 2023-11-05 ENCOUNTER — Encounter: Admitting: *Deleted

## 2023-11-05 DIAGNOSIS — Z9889 Other specified postprocedural states: Secondary | ICD-10-CM

## 2023-11-05 NOTE — Progress Notes (Signed)
 Daily Session Note  Patient Details  Name: [name] MRN: [id number] Date of Birth: 1955/10/11 Referring Provider:   Flowsheet Row Cardiac Rehab from 08/01/2023 in [facility] Cardiac and Pulmonary Rehab  Referring Provider Rae Bugler, MD       Encounter Date: 11/05/2023  Check In:  Session Check In - 11/05/23 0936       Check-In   Supervising physician immediately available to respond to emergencies See telemetry face sheet for immediately available ER MD    Location [facility]-Cardiac & Pulmonary Rehab    Staff Present Maxon Conetta BS, Exercise Physiologist;Kelly Dawne Euler, ACSM CEP, Exercise Physiologist;[employer];Maud Sorenson, RN, BSN, CCRP    Virtual Visit No    Medication changes reported     No    Fall or balance concerns reported    No    Warm-up and Cool-down Performed on first and last piece of equipment    Resistance Training Performed Yes    VAD Patient? No    PAD/SET Patient? No      Pain Assessment   Currently in Pain? No/denies                Social History   Tobacco Use  Smoking Status Former   Current packs/day: 0.00   Types: Cigarettes   Quit date: 02/13/1974   Years since quitting: 49.7  Smokeless Tobacco Never    Goals Met:  Independence with exercise equipment Exercise tolerated well No report of concerns or symptoms today  Goals Unmet:  Not Applicable  Comments: Pt able to follow exercise prescription today without complaint.  Will continue to monitor for progression.    Dr. Firman Hughes is Medical Director for [facility] Cardiac Rehabilitation.  Dr. Fuad Aleskerov is Medical Director for [facility] Pulmonary Rehabilitation.

## 2023-11-07 ENCOUNTER — Encounter: Admitting: *Deleted

## 2023-11-07 VITALS — Ht 64.5 in | Wt 146.3 lb

## 2023-11-07 DIAGNOSIS — Z9889 Other specified postprocedural states: Secondary | ICD-10-CM

## 2023-11-07 NOTE — Progress Notes (Signed)
 Daily Session Note  Patient Details  Name: [name] MRN: [id number] Date of Birth: 02-12-1956 Referring Provider:   Flowsheet Row Cardiac Rehab from 08/01/2023 in [facility] Cardiac and Pulmonary Rehab  Referring Provider Rae Bugler, MD       Encounter Date: 11/07/2023  Check In:  Session Check In - 11/07/23 0940       Check-In   Supervising physician immediately available to respond to emergencies See telemetry face sheet for immediately available ER MD    Location [facility]-Cardiac & Pulmonary Rehab    Staff Present Lyell Samuel, MS, Exercise Physiologist;Maxon Conetta BS, Exercise Physiologist;Saphire Barnhart, RN, BSN, CCRP;Joseph Hood RCP,RRT,BSRT    Virtual Visit No    Medication changes reported     No    Fall or balance concerns reported    No    Warm-up and Cool-down Performed on first and last piece of equipment    Resistance Training Performed Yes    VAD Patient? No    PAD/SET Patient? No      Pain Assessment   Currently in Pain? No/denies                Social History   Tobacco Use  Smoking Status Former   Current packs/day: 0.00   Types: Cigarettes   Quit date: 02/13/1974   Years since quitting: 49.7  Smokeless Tobacco Never    Goals Met:  Independence with exercise equipment Exercise tolerated well No report of concerns or symptoms today  Goals Unmet:  Not Applicable  Comments: Pt able to follow exercise prescription today without complaint.  Will continue to monitor for progression.    Dr. Firman Hughes is Medical Director for [facility] Cardiac Rehabilitation.  Dr. Fuad Aleskerov is Medical Director for [facility] Pulmonary Rehabilitation.

## 2023-11-07 NOTE — Patient Instructions (Signed)
 Discharge Patient Instructions  Patient Details  Name: [name] MRN: [id number] Date of Birth: [birth year] Referring Provider:  Rae Bugler, MD   Number of Visits: 27  Reason for Discharge:  Patient reached a stable level of exercise. Patient independent in their exercise. Patient has met program and personal goals.   Diagnosis:  S/P aortic valve repair  S/P aortic dissection repair  Initial Exercise Prescription:  Initial Exercise Prescription - 08/01/23 1000       Date of Initial Exercise RX and Referring Provider   Date 08/01/23    Referring Provider Rae Bugler, MD      Oxygen   Maintain Oxygen Saturation 88% or higher      Treadmill   MPH 2    Grade 0    Minutes 15    METs 2.53      Recumbant Bike   Level 2    RPM 50    Watts 15    Minutes 15    METs 2.69      NuStep   Level 2    SPM 80    Minutes 15    METs 2.69      REL-XR   Level 1    Speed 50    Minutes 15    METs 2.69      T5 Nustep   Level 2    SPM 80    Minutes 15    METs 2.69      Track   Laps 30    Minutes 15    METs 2.63      Prescription Details   Frequency (times per week) 3    Duration Progress to 30 minutes of continuous aerobic without signs/symptoms of physical distress      Intensity   THRR 40-80% of Max Heartrate 103-136    Ratings of Perceived Exertion 11-13    Perceived Dyspnea 0-4      Progression   Progression Continue to progress workloads to maintain intensity without signs/symptoms of physical distress.      Resistance Training   Training Prescription Yes    Weight 3 lb    Reps 10-15             Discharge Exercise Prescription (Final Exercise Prescription Changes):  Exercise Prescription Changes - 10/22/23 1100       Response to Exercise   Blood Pressure (Admit) 118/60    Blood Pressure (Exit) 104/62    Heart Rate (Admit) 82 bpm    Heart Rate (Exercise) 106 bpm    Heart Rate (Exit) 70 bpm    Rating of Perceived Exertion  (Exercise) 15    Symptoms none    Duration Continue with 30 min of aerobic exercise without signs/symptoms of physical distress.    Intensity THRR unchanged      Progression   Progression Continue to progress workloads to maintain intensity without signs/symptoms of physical distress.    Average METs 5.01      Resistance Training   Training Prescription Yes    Weight 3 lb    Reps 10-15      Interval Training   Interval Training No      Treadmill   MPH 3.3    Grade 6    Minutes 15    METs 6.25      REL-XR   Level 7    Minutes 15    METs 4.5      Home Exercise Plan   Plans to continue  exercise at Home (comment)   Treadmill and resistance training at home   Frequency Add 2 additional days to program exercise sessions.    Initial Home Exercises Provided 09/19/23      Oxygen   Maintain Oxygen Saturation 88% or higher             Functional Capacity:  6 Minute Walk     Row Name 08/01/23 1048 11/07/23 0943       6 Minute Walk   Phase Initial Discharge    Distance 1085 feet 1620 feet    Distance % Change -- 49.3 %    Distance Feet Change -- 535 ft    Walk Time 6 minutes 6 minutes    # of Rest Breaks 0 0    MPH 2.05 3.07    METS 2.69 3.67    RPE 12 13    Perceived Dyspnea  1 1    VO2 Peak 9.41 12.86    Symptoms Yes (comment) No    Comments Bilateral hip pain 7/10 at end of test --    Resting HR 76 bpm 74 bpm    Resting BP 138/84 102/60    Resting Oxygen Saturation  98 % 97 %    Exercise Oxygen Saturation  during 6 min walk 96 % 96 %    Max Ex. HR 81 bpm 93 bpm    Max Ex. BP 144/80 134/68    2 Minute Post BP 130/80 --             Nutrition & Weight - Outcomes:  Pre Biometrics - 08/01/23 1054       Pre Biometrics   Height 5' 4.5" (1.638 m)    Weight 145 lb 11.2 oz (66.1 kg)    Waist Circumference 37.5 inches    Hip Circumference 39.5 inches    Waist to Hip Ratio 0.95 %    BMI (Calculated) 24.63    Single Leg Stand 30 seconds              Post Biometrics - 11/07/23 0945        Post  Biometrics   Height 5' 4.5" (1.638 m)    Weight 146 lb 4.8 oz (66.4 kg)    Waist Circumference 33 inches    Hip Circumference 35.5 inches    Waist to Hip Ratio 0.93 %    BMI (Calculated) 24.73    Single Leg Stand 30 seconds

## 2023-11-09 ENCOUNTER — Encounter

## 2023-11-12 ENCOUNTER — Encounter

## 2023-11-14 ENCOUNTER — Encounter: Admitting: *Deleted

## 2023-11-14 DIAGNOSIS — Z9889 Other specified postprocedural states: Secondary | ICD-10-CM | POA: Diagnosis not present

## 2023-11-14 NOTE — Progress Notes (Signed)
 Daily Session Note  Patient Details  Name: [name] MRN: [id number] Date of Birth: 02-12-1956 Referring Provider:   Flowsheet Row Cardiac Rehab from 08/01/2023 in [facility] Cardiac and Pulmonary Rehab  Referring Provider Rae Bugler, MD       Encounter Date: 11/14/2023  Check In:  Session Check In - 11/14/23 0941       Check-In   Supervising physician immediately available to respond to emergencies See telemetry face sheet for immediately available ER MD    Location [facility]-Cardiac & Pulmonary Rehab    Staff Present Maxon Conetta BS, Exercise Physiologist;Noah Tickle, BS, Exercise Physiologist;Haliey Romberg, RN, BSN, CCRP;Joseph Hood RCP,RRT,BSRT    Virtual Visit No    Medication changes reported     No    Fall or balance concerns reported    No    Warm-up and Cool-down Performed on first and last piece of equipment    Resistance Training Performed Yes    VAD Patient? No    PAD/SET Patient? No      Pain Assessment   Currently in Pain? No/denies                Social History   Tobacco Use  Smoking Status Former   Current packs/day: 0.00   Types: Cigarettes   Quit date: 02/13/1974   Years since quitting: 49.7  Smokeless Tobacco Never    Goals Met:  Independence with exercise equipment Exercise tolerated well No report of concerns or symptoms today  Goals Unmet:  Not Applicable  Comments: Pt able to follow exercise prescription today without complaint.  Will continue to monitor for progression.    Dr. Firman Hughes is Medical Director for [facility] Cardiac Rehabilitation.  Dr. Fuad Aleskerov is Medical Director for [facility] Pulmonary Rehabilitation.

## 2023-11-15 MED ORDER — CARVEDILOL 12.5 MG PO TABS: 12.5000 mg | ORAL_TABLET | Freq: Two times a day (BID) | ORAL | 2 refills | Status: DC

## 2023-11-16 ENCOUNTER — Encounter: Attending: Family Medicine | Admitting: *Deleted

## 2023-11-16 DIAGNOSIS — Z9889 Other specified postprocedural states: Secondary | ICD-10-CM | POA: Diagnosis not present

## 2023-11-16 NOTE — Progress Notes (Signed)
 Daily Session Note  Patient Details  Name: [name] MRN: [id number] Date of Birth: Aug 22, 1955 Referring Provider:   Flowsheet Row Cardiac Rehab from 08/01/2023 in [facility]  Referring Provider Rae Bugler, MD       Encounter Date: 11/16/2023  Check In:  Session Check In - 11/16/23 0925       Check-In   Supervising physician immediately available to respond to emergencies See telemetry face sheet for immediately available ER MD    Location [facility]-Cardiac & Pulmonary Rehab    Staff Present Maxon Conetta BS, Exercise Physiologist;Noah Tickle, BS, Exercise Physiologist;Shatarra Wehling, RN, BSN, CCRP;Joseph Hood RCP,RRT,BSRT    Virtual Visit No    Medication changes reported     No    Fall or balance concerns reported    No    Warm-up and Cool-down Performed on first and last piece of equipment    Resistance Training Performed Yes    VAD Patient? No    PAD/SET Patient? No      Pain Assessment   Currently in Pain? No/denies                Social History   Tobacco Use  Smoking Status Former   Current packs/day: 0.00   Types: Cigarettes   Quit date: 02/13/1974   Years since quitting: 49.7  Smokeless Tobacco Never    Goals Met:  Independence with exercise equipment Exercise tolerated well No report of concerns or symptoms today  Goals Unmet:  Not Applicable  Comments: Pt able to follow exercise prescription today without complaint.  Will continue to monitor for progression.    Dr. Firman Hughes is Medical Director for [facility] Cardiac Rehabilitation.  Dr. Fuad Aleskerov is Medical Director for [facility] Pulmonary Rehabilitation.

## 2023-11-19 ENCOUNTER — Encounter: Admitting: *Deleted

## 2023-11-19 DIAGNOSIS — Z9889 Other specified postprocedural states: Secondary | ICD-10-CM | POA: Diagnosis not present

## 2023-11-19 NOTE — Progress Notes (Signed)
 Daily Session Note  Patient Details  Name: [name] MRN: [id number] Date of Birth: 1956/07/05 Referring Provider:   Flowsheet Row Cardiac Rehab from 08/01/2023 in [facility] Cardiac and Pulmonary Rehab  Referring Provider Rae Bugler, MD       Encounter Date: 11/19/2023  Check In:  Session Check In - 11/19/23 0935       Check-In   Supervising physician immediately available to respond to emergencies See telemetry face sheet for immediately available ER MD    Location [facility]-Card[facility] Pul[facility]    Staff Present Maud Sorenson, RN, BSN, CCRP;Joseph Hood RCP,RRT,BSRT;Kelly Quincy BS, ACSM CEP, Exercise Physiologist;Maxon Conetta BS, Exercise Physiologist    Virtual Visit No    Medication changes reported     No    Fall or balance concerns reported    No    Warm-up and Cool-down Performed on first and last piece of equipment    Resistance Training Performed Yes    VAD Patient? No    PAD/SET Patient? No      Pain Assessment   Currently in Pain? No/denies                Social History   Tobacco Use  Smoking Status Former   Current packs/day: 0.00   Types: Cigarettes   Quit date: 02/13/1974   Years since quitting: 49.7  Smokeless Tobacco Never    Goals Met:  Independence with exercise equipment Exercise tolerated well No report of concerns or symptoms today  Goals Unmet:  Not Applicable  Comments: Pt able to follow exercise prescription today without complaint.  Will continue to monitor for progression.    Dr. Firman Hughes is Medical Director for [facility] Cardiac Rehabilitation.  Dr. Fuad Aleskerov is Medical Director for [facility] Pulmonary Rehabilitation.

## 2023-11-21 ENCOUNTER — Encounter: Admitting: *Deleted

## 2023-11-21 DIAGNOSIS — Z9889 Other specified postprocedural states: Secondary | ICD-10-CM

## 2023-11-21 NOTE — Progress Notes (Signed)
 Cardiac Individual Treatment Plan  Patient Details  Name: [name] MRN: [id number] Date of Birth: [birth year] Referring Provider:   Flowsheet Row Cardiac Rehab from 08/01/2023 in [facility] Cardiac and Pulmonary Rehab  Referring Provider Rae Bugler, MD       Initial Encounter Date:  Flowsheet Row Cardiac Rehab from 08/01/2023 in [facility] Cardiac and Pulmonary Rehab  Date 08/01/23       Visit Diagnosis: S/P aortic valve repair  S/P aortic dissection repair  Patient's Home Medications on Admission:  Current Outpatient Medications:    amLODipine  (NORVASC ) 2.5 MG tablet, Take 1 tablet (2.5 mg total) by mouth in the morning and at bedtime., Disp: 180 tablet, Rfl: 3   amoxicillin  (AMOXIL ) 500 MG tablet, Take 30 min-1 hour prior to all dental work, Disp: 4 tablet, Rfl: 0   aspirin EC 81 MG tablet, Take 81 mg by mouth daily. Swallow whole., Disp: , Rfl:    calcium carbonate (OSCAL) 1500 (600 Ca) MG TABS tablet, Take 600 mg of elemental calcium by mouth daily., Disp: , Rfl:    carvedilol  (COREG ) 12.5 MG tablet, Take 1 tablet (12.5 mg total) by mouth 2 (two) times daily., Disp: 180 tablet, Rfl: 2   Cholecalciferol (VITAMIN D3 SUPER STRENGTH) 50 MCG (2000 UT) CAPS, Take by mouth daily at 6 (six) AM., Disp: , Rfl:    cholestyramine light (PREVALITE) 4 g packet, Take 4 g by mouth 2 (two) times daily., Disp: , Rfl:    Glucosamine Sulfate 500 MG TABS, Take by mouth as needed (joint pain)., Disp: , Rfl:    Niacin (VITAMIN B-3 PO), Take 1 tablet by mouth daily., Disp: , Rfl:    NON FORMULARY, Pt uses a cpap nightly, Disp: , Rfl:    POTASSIUM PO, Take 2 tablets by mouth daily., Disp: , Rfl:    triamterene-hydrochlorothiazide (MAXZIDE) 75-50 MG tablet, Take 1 tablet by mouth daily., Disp: , Rfl:   Past Medical History: Past Medical History:  Diagnosis Date   Actinic keratosis    Arthritis    Diverticulosis    Dysplastic nevus 09/27/2009   Left post waistline. Mild atypia. Limited margins  free.   History of gallstones    History of hiatal hernia    Hx of basal cell carcinoma 06/18/2012   L bra line infrascapular   Hx of squamous cell carcinoma of skin    multiple sites   Hypertension    Peptic ulcer disease 1991   Sleep apnea    cpap   Squamous cell carcinoma in situ (SCCIS) 04/26/2022   left abdomen ED&C 07/03/22   Squamous cell carcinoma of skin 05/07/2007   Right medial superior breast. SCCis   Squamous cell carcinoma of skin 06/09/2013   Right medial calf. WD SCC. Recurrent 07/28/2013. Tx: EDC   Squamous cell carcinoma of skin 01/13/2014   Left medial pretibial. SCC, KA pattern   Squamous cell carcinoma of skin 09/06/2016   Left medial lower leg. WD SCC   Squamous cell carcinoma of skin 11/07/2017   Right distal pretibial. WD SCC. Tx: EDC   Squamous cell carcinoma of skin 07/03/2022   Left pretibia. EDC    Tobacco Use: Social History   Tobacco Use  Smoking Status Former   Current packs/day: 0.00   Types: Cigarettes   Quit date: 02/13/1974   Years since quitting: 49.8  Smokeless Tobacco Never    Labs: Review Flowsheet       Latest Ref Rng & Units 05/22/2023  Labs for ITP  Cardiac and Pulmonary Rehab  TCO2 22 - 32 mmol/L 27      Exercise Target Goals: Exercise Program Goal: Individual exercise prescription set using results from initial 6 min walk test and THRR while considering  patient's activity barriers and safety.   Exercise Prescription Goal: Initial exercise prescription builds to 30-45 minutes a day of aerobic activity, 2-3 days per week.  Home exercise guidelines will be given to patient during program as part of exercise prescription that the participant will acknowledge.   Education: Aerobic Exercise: - Group verbal and visual presentation on the components of exercise prescription. Introduces F.I.T.T principle from ACSM for exercise prescriptions.  Reviews F.I.T.T. principles of aerobic exercise including progression. Written  material given at graduation. Flowsheet Row Cardiac Rehab from 10/17/2023 in [facility] Cardiac and Pulmonary Rehab  Date 10/03/23  Educator NT  Instruction Review Code 1- Verbalizes Understanding       Education: Resistance Exercise: - Group verbal and visual presentation on the components of exercise prescription. Introduces F.I.T.T principle from ACSM for exercise prescriptions  Reviews F.I.T.T. principles of resistance exercise including progression. Written material given at graduation.    Education: Exercise & Equipment Safety: - Individual verbal instruction and demonstration of equipment use and safety with use of the equipment. Flowsheet Row Cardiac Rehab from 10/17/2023 in [facility] Cardiac and Pulmonary Rehab  Date 08/01/23  Educator MB  Instruction Review Code 1- Verbalizes Understanding       Education: Exercise Physiology & General Exercise Guidelines: - Group verbal and written instruction with models to review the exercise physiology of the cardiovascular system and associated critical values. Provides general exercise guidelines with specific guidelines to those with heart or lung disease.    Education: Flexibility, Balance, Mind/Body Relaxation: - Group verbal and visual presentation with interactive activity on the components of exercise prescription. Introduces F.I.T.T principle from ACSM for exercise prescriptions. Reviews F.I.T.T. principles of flexibility and balance exercise training including progression. Also discusses the mind body connection.  Reviews various relaxation techniques to help reduce and manage stress (i.e. Deep breathing, progressive muscle relaxation, and visualization). Balance handout provided to take home. Written material given at graduation. Flowsheet Row Cardiac Rehab from 10/17/2023 in [facility] Cardiac and Pulmonary Rehab  Date 09/26/23  Educator NT  Instruction Review Code 1- Verbalizes Understanding       Activity Barriers & Risk Stratification:   Activity Barriers & Cardiac Risk Stratification - 08/01/23 1049       Activity Barriers & Cardiac Risk Stratification   Activity Barriers Joint Problems   Bilateral hip pain   Cardiac Risk Stratification Low             6 Minute Walk:  6 Minute Walk     Row Name 08/01/23 1048 11/07/23 0943       6 Minute Walk   Phase Initial Discharge    Distance 1085 feet 1620 feet    Distance % Change -- 49.3 %    Distance Feet Change -- 535 ft    Walk Time 6 minutes 6 minutes    # of Rest Breaks 0 0    MPH 2.05 3.07    METS 2.69 3.67    RPE 12 13    Perceived Dyspnea  1 1    VO2 Peak 9.41 12.86    Symptoms Yes (comment) No    Comments Bilateral hip pain 7/10 at end of test --    Resting HR 76 bpm 74 bpm    Resting BP 138/84  102/60    Resting Oxygen Saturation  98 % 97 %    Exercise Oxygen Saturation  during 6 min walk 96 % 96 %    Max Ex. HR 81 bpm 93 bpm    Max Ex. BP 144/80 134/68    2 Minute Post BP 130/80 --             Oxygen Initial Assessment:   Oxygen Re-Evaluation:   Oxygen Discharge (Final Oxygen Re-Evaluation):   Initial Exercise Prescription:  Initial Exercise Prescription - 08/01/23 1000       Date of Initial Exercise RX and Referring Provider   Date 08/01/23    Referring Provider Rae Bugler, MD      Oxygen   Maintain Oxygen Saturation 88% or higher      Treadmill   MPH 2    Grade 0    Minutes 15    METs 2.53      Recumbant Bike   Level 2    RPM 50    Watts 15    Minutes 15    METs 2.69      NuStep   Level 2    SPM 80    Minutes 15    METs 2.69      REL-XR   Level 1    Speed 50    Minutes 15    METs 2.69      T5 Nustep   Level 2    SPM 80    Minutes 15    METs 2.69      Track   Laps 30    Minutes 15    METs 2.63      Prescription Details   Frequency (times per week) 3    Duration Progress to 30 minutes of continuous aerobic without signs/symptoms of physical distress      Intensity   THRR 40-80% of Max  Heartrate 103-136    Ratings of Perceived Exertion 11-13    Perceived Dyspnea 0-4      Progression   Progression Continue to progress workloads to maintain intensity without signs/symptoms of physical distress.      Resistance Training   Training Prescription Yes    Weight 3 lb    Reps 10-15             Perform Capillary Blood Glucose checks as needed.  Exercise Prescription Changes:   Exercise Prescription Changes     Row Name 08/01/23 1000 08/13/23 1700 08/29/23 1100 09/14/23 0900 09/19/23 0900     Response to Exercise   Blood Pressure (Admit) 138/84 120/82 120/82 106/62 --   Blood Pressure (Exercise) 144/80 128/80 132/80 132/60 --   Blood Pressure (Exit) 130/80 114/72 122/74 110/58 --   Heart Rate (Admit) 76 bpm 78 bpm 85 bpm 77 bpm --   Heart Rate (Exercise) 81 bpm 95 bpm 110 bpm 106 bpm --   Heart Rate (Exit) 70 bpm 83 bpm 78 bpm 85 bpm --   Oxygen Saturation (Admit) 98 % -- -- -- --   Oxygen Saturation (Exercise) 96 % -- -- -- --   Oxygen Saturation (Exit) 96 % -- -- -- --   Rating of Perceived Exertion (Exercise) 12 13 14 14  --   Perceived Dyspnea (Exercise) 1 -- -- -- --   Symptoms Bilateral hip pain 7/10 none none none --   Comments Results first full day of exercise -- -- --   Duration Progress to 30 minutes of  aerobic without  signs/symptoms of physical distress Progress to 30 minutes of  aerobic without signs/symptoms of physical distress Progress to 30 minutes of  aerobic without signs/symptoms of physical distress Continue with 30 min of aerobic exercise without signs/symptoms of physical distress. --   Intensity THRR New THRR unchanged THRR unchanged THRR unchanged --     Progression   Progression Continue to progress workloads to maintain intensity without signs/symptoms of physical distress. Continue to progress workloads to maintain intensity without signs/symptoms of physical distress. Continue to progress workloads to maintain intensity without  signs/symptoms of physical distress. Continue to progress workloads to maintain intensity without signs/symptoms of physical distress. --   Average METs 2.69 1.99 2.85 3.18 --     Resistance Training   Training Prescription -- Yes Yes Yes --   Weight -- 3 lb 3 lb 3 lb --   Reps -- 10-15 10-15 10-15 --     Interval Training   Interval Training -- No No No --     Treadmill   MPH -- -- 3 3.3 --   Grade -- -- 0 0 --   Minutes -- -- 15 15 --   METs -- -- 3.3 3.53 --     NuStep   Level -- -- 5 4  T6: 1 --   Minutes -- -- 15 15 --   METs -- -- 3.5 --  T6: 2.3 --     REL-XR   Level -- -- 2 -- --   Minutes -- -- 15 -- --   METs -- -- 4.4 -- --     T5 Nustep   Level -- 1 1 2  --   Minutes -- 15 15 15  --   METs -- 2.1 2.5 2.4 --     Track   Laps -- 16 16 -- --   Minutes -- 15 15 -- --   METs -- 1.87 1.87 -- --     Home Exercise Plan   Plans to continue exercise at -- -- -- -- Home (comment)  Treadmill and resistance training at home   Frequency -- -- -- -- Add 2 additional days to program exercise sessions.   Initial Home Exercises Provided -- -- -- -- 09/19/23     Oxygen   Maintain Oxygen Saturation -- -- 88% or higher 88% or higher 88% or higher    Row Name 09/27/23 1200 10/10/23 0800 10/22/23 1100 11/08/23 1600       Response to Exercise   Blood Pressure (Admit) 118/60 102/58 118/60 110/62    Blood Pressure (Exercise) 102/56 -- -- --    Blood Pressure (Exit) 118/60 122/60 104/62 106/62    Heart Rate (Admit) 72 bpm 72 bpm 82 bpm 65 bpm    Heart Rate (Exercise) 129 bpm 118 bpm 106 bpm 97 bpm    Heart Rate (Exit) 91 bpm 74 bpm 70 bpm 72 bpm    Rating of Perceived Exertion (Exercise) 15 15 15 15     Symptoms none none none none    Duration Continue with 30 min of aerobic exercise without signs/symptoms of physical distress. Continue with 30 min of aerobic exercise without signs/symptoms of physical distress. Continue with 30 min of aerobic exercise without signs/symptoms of  physical distress. Continue with 30 min of aerobic exercise without signs/symptoms of physical distress.    Intensity THRR unchanged THRR unchanged THRR unchanged THRR unchanged      Progression   Progression Continue to progress workloads to maintain intensity without signs/symptoms of  physical distress. Continue to progress workloads to maintain intensity without signs/symptoms of physical distress. Continue to progress workloads to maintain intensity without signs/symptoms of physical distress. Continue to progress workloads to maintain intensity without signs/symptoms of physical distress.    Average METs 3.78 3.99 5.01 5.28      Resistance Training   Training Prescription Yes Yes Yes Yes    Weight 3 lb 3 lb 3 lb 3 lb    Reps 10-15 10-15 10-15 10-15      Interval Training   Interval Training No No No No      Treadmill   MPH 3.2 3.2 3.3 3.2    Grade 1.5 2.5 6 7     Minutes 15 15 15 15     METs 4.11 4.55 6.25 6.54      NuStep   Level 4  T6 5 -- 5    Minutes 15 15 -- 15    METs 3 4.7 -- --      REL-XR   Level 5 5 7  --    Minutes 15 15 15  --    METs 4.2 4.1 4.5 --      T5 Nustep   Level -- 4  T6 -- 4  T6 nustep    Minutes -- 15 -- 15    METs -- 2.5 -- 3.2      Home Exercise Plan   Plans to continue exercise at Home (comment)  Treadmill and resistance training at home Home (comment)  Treadmill and resistance training at home Home (comment)  Treadmill and resistance training at home Home (comment)  Treadmill and resistance training at home    Frequency Add 2 additional days to program exercise sessions. Add 2 additional days to program exercise sessions. Add 2 additional days to program exercise sessions. Add 2 additional days to program exercise sessions.    Initial Home Exercises Provided 09/19/23 09/19/23 09/19/23 09/19/23      Oxygen   Maintain Oxygen Saturation 88% or higher 88% or higher 88% or higher 88% or higher             Exercise Comments:   Exercise  Comments     Row Name 08/06/23 0908 11/21/23 0954         Exercise Comments First full day of exercise!  Patient was oriented to gym and equipment including functions, settings, policies, and procedures.  Patient's individual exercise prescription and treatment plan were reviewed.  All starting workloads were established based on the results of the 6 minute walk test done at initial orientation visit.  The plan for exercise progression was also introduced and progression will be customized based on patient's performance and goals. Sally graduated today from  rehab with 35 sessions completed.  Details of the patient's exercise prescription and what She needs to do in order to continue the prescription and progress were discussed with patient.  Patient was given a copy of prescription and goals.  Patient verbalized understanding. Arbutus plans to continue to exercise by by using TM at home.               Exercise Goals and Review:   Exercise Goals     Row Name 08/01/23 1053             Exercise Goals   Increase Physical Activity Yes       Intervention Provide advice, education, support and counseling about physical activity/exercise needs.;Develop an individualized exercise prescription for aerobic and resistive training based on initial evaluation  findings, risk stratification, comorbidities and participant's personal goals.       Expected Outcomes Short Term: Attend rehab on a regular basis to increase amount of physical activity.;Long Term: Add in home exercise to make exercise part of routine and to increase amount of physical activity.;Long Term: Exercising regularly at least 3-5 days a week.       Increase Strength and Stamina Yes       Intervention Provide advice, education, support and counseling about physical activity/exercise needs.;Develop an individualized exercise prescription for aerobic and resistive training based on initial evaluation findings, risk stratification,  comorbidities and participant's personal goals.       Expected Outcomes Short Term: Increase workloads from initial exercise prescription for resistance, speed, and METs.;Short Term: Perform resistance training exercises routinely during rehab and add in resistance training at home;Long Term: Improve cardiorespiratory fitness, muscular endurance and strength as measured by increased METs and functional capacity ( )       Able to understand and use rate of perceived exertion (RPE) scale Yes       Intervention Provide education and explanation on how to use RPE scale       Expected Outcomes Short Term: Able to use RPE daily in rehab to express subjective intensity level;Long Term:  Able to use RPE to guide intensity level when exercising independently       Able to understand and use Dyspnea scale Yes       Intervention Provide education and explanation on how to use Dyspnea scale       Expected Outcomes Short Term: Able to use Dyspnea scale daily in rehab to express subjective sense of shortness of breath during exertion;Long Term: Able to use Dyspnea scale to guide intensity level when exercising independently       Knowledge and understanding of Target Heart Rate Range (THRR) Yes       Intervention Provide education and explanation of THRR including how the numbers were predicted and where they are located for reference       Expected Outcomes Short Term: Able to state/look up THRR;Short Term: Able to use daily as guideline for intensity in rehab;Long Term: Able to use THRR to govern intensity when exercising independently       Able to check pulse independently Yes       Intervention Provide education and demonstration on how to check pulse in carotid and radial arteries.;Review the importance of being able to check your own pulse for safety during independent exercise       Expected Outcomes Short Term: Able to explain why pulse checking is important during independent exercise;Long Term: Able to  check pulse independently and accurately       Understanding of Exercise Prescription Yes       Intervention Provide education, explanation, and written materials on patient's individual exercise prescription       Expected Outcomes Short Term: Able to explain program exercise prescription;Long Term: Able to explain home exercise prescription to exercise independently                Exercise Goals Re-Evaluation :  Exercise Goals Re-Evaluation     Row Name 08/06/23 0913 08/13/23 1722 08/23/23 0943 08/29/23 1151 09/14/23 0935     Exercise Goal Re-Evaluation   Exercise Goals Review Increase Physical Activity;Able to understand and use rate of perceived exertion (RPE) scale;Knowledge and understanding of Target Heart Rate Range (THRR);Understanding of Exercise Prescription;Increase Strength and Stamina;Able to check pulse independently Increase Physical Activity;Understanding of  Exercise Prescription;Increase Strength and Stamina Increase Physical Activity;Increase Strength and Stamina;Understanding of Exercise Prescription -- Increase Physical Activity;Increase Strength and Stamina;Understanding of Exercise Prescription   Comments Reviewed RPE and dyspnea scale, THR and program prescription with pt today.  Pt voiced understanding and was given a copy of goals to take home. Danaiya completed her first exercise session. She was able to complete 16 laps on the track and work at level 1 on the T5 nustep. We will continue to monitor her progress in the program. Pallie is doing well here at rehab, on treadmill today at speed of 3. She reports that her hips hurt after ~68mins of walking but if she stops for a bit, the pain goes away. Reports that coming to rehab has helped her walk longer before the onset of pain. Encouraged her to contineu to come to rehab and work on strength and stamina. Reminded her to comminicate with EP and rehab team if pain increases or last longer than usual. Aubreyrose is doing well in  rehab. She was able to increase her level on the T4 nustep from 2 to 5, and increase her level on the XR from 1 to 2. She was also able to increase her speed on the treadmill from to on no incline. We will continue to monitor her progress in the program. Henrene continues to do well in rehab. She recently increased her treadmill workload to a speed of 3.3 mph with no incline. She also improved to level 2 on the T5 nustep and continues to do well with 3 lb hand weights for resistance training. We will continue to monitor her progress in the program.   Expected Outcomes Short: Use RPE daily to regulate intensity.  Long: Follow program prescription in THR. Short: Continue to follow current exercise prescription. Long: Continue exercise to improve strength and stamina. STG: increase workload as able without hurting hips. LTG: continue to exercise to improve strength and stamina Short: Continue to increase treadmill workload. Long: Continue exercise to improve strength and stamina. Short: Add incline to treadmill workload. Long: Continue exercise to improve strength and stamina.    Row Name 09/19/23 0935 09/27/23 1242 10/01/23 0956 10/10/23 0834 10/22/23 1146     Exercise Goal Re-Evaluation   Exercise Goals Review Increase Strength and Stamina;Understanding of Exercise Prescription;Able to understand and use Dyspnea scale;Increase Physical Activity;Knowledge and understanding of Target Heart Rate Range (THRR);Able to check pulse independently;Able to understand and use rate of perceived exertion (RPE) scale Increase Physical Activity;Understanding of Exercise Prescription;Increase Strength and Stamina Increase Physical Activity;Understanding of Exercise Prescription;Increase Strength and Stamina Increase Physical Activity;Understanding of Exercise Prescription;Increase Strength and Stamina Increase Physical Activity;Understanding of Exercise Prescription;Increase Strength and Stamina   Comments Reviewed  home exercise with pt today.  Pt plans to walk on her treadmill at home for exercise. Reviewed THR, pulse, RPE, sign and symptoms, pulse oximetery and when to call 911 or MD.  Also discussed weather considerations and indoor options.  Pt voiced understanding. Alonah is doing well in rehab. She was able to increase to 1.5% incline on the treadmill. She was also able to increase from level 2 to level 5 on the XR. Sejal is walking 1-2 days a week at home on her TM on off days of rehab. She reports that she is tolerating this well and is most of the time getting 4-5 days a week of exercise at home. We reviewed some of the concepts from her home exercise guidelines and she was encouraged  to buy a pulse ox to more easily monitor her heart rate during exercise. Casi is doing well in the program. She was able to increase her incline on the treadmill from 1.5% to 2.5%. She was also able to increase from level 4 to level 5 on the T4 nustep. We will continue to monitor her progress in the program. Mersadie continues to do well in the program. She recently increased her treadmill workload to a speed of 3.3 mph with an incline of 6%. She also improved to level 7 on the XR and continues to use 3 lb hand weights for resistance training. We will continue to monitor her progress in the program.   Expected Outcomes Short: Begin walking on treadmill on days away from rehab. Long: Continue to exercise independently. Short: Continue to increase treadmill wokrload. Long: Continue exercise to improve strength and stamina. Short: buy a pulse ox to monitor HR during exercise at home. Continue to walk 1-2 days a week on TM at home on off days of rehab. Long: become independent with exerise routine upon graduation from cardiac rehab. Short: Continue to increase treadmill workload. Long: Continue exercise to improve strength and stamina. Short: Try 4 lb hand weights for resistance training. Long: Continue exercise to improve strength and  stamina.    Row Name 11/05/23 1610 11/07/23 1000 11/08/23 1613         Exercise Goal Re-Evaluation   Exercise Goals Review Increase Physical Activity;Increase Strength and Stamina;Understanding of Exercise Prescription Increase Physical Activity;Increase Strength and Stamina;Understanding of Exercise Prescription Increase Physical Activity;Increase Strength and Stamina;Understanding of Exercise Prescription     Comments Ellah states that she feels her exercise is going well and her energy levels are increasing, but she has not become as consistent with home exercise as she would like due to frequent vacation inturruptions. She does have a TM and is planning to try to start walking on that on off days of rehab. Her main limiting facotor from walking outside is hip pain. Benefits of physical therapy were discussed if her hip pain continues. She still struggles with SOB when going up stairs. Ahmara is planning to attend the [facility] upon graduation from the program for her exercise. She found interest in their group fitness classes as well and found one during the time she attends rehab. Prabhleen continues to do well in rehab. She recently increased her incline to 7% on the treadmill while maintaining a speed of 3.2 mph. She also continues to work at level 5 on the T4 nustep and level 4 on on the T6 nustep. She is due for her post and will look to improve on it. We will continue to monitor her progress in the program.     Expected Outcomes Short: start walking to TM at home. Monitor hip pain concerns and talk to doctor about PT referral if this continues. Long: maintain independent exercise routine upon graduation from cardiac reahb. Short: Attend [facility] for exercise upon graduation. Long: Continue to exercise independently to improve strength and stamina Short: Improve on post . Long: Continue exercise to improve strength and stamina.              Discharge Exercise Prescription (Final Exercise  Prescription Changes):  Exercise Prescription Changes - 11/08/23 1600       Response to Exercise   Blood Pressure (Admit) 110/62    Blood Pressure (Exit) 106/62    Heart Rate (Admit) 65 bpm    Heart Rate (Exercise) 97 bpm  Heart Rate (Exit) 72 bpm    Rating of Perceived Exertion (Exercise) 15    Symptoms none    Duration Continue with 30 min of aerobic exercise without signs/symptoms of physical distress.    Intensity THRR unchanged      Progression   Progression Continue to progress workloads to maintain intensity without signs/symptoms of physical distress.    Average METs 5.28      Resistance Training   Training Prescription Yes    Weight 3 lb    Reps 10-15      Interval Training   Interval Training No      Treadmill   MPH 3.2    Grade 7    Minutes 15    METs 6.54      NuStep   Level 5    Minutes 15      T5 Nustep   Level 4   T6 nustep   Minutes 15    METs 3.2      Home Exercise Plan   Plans to continue exercise at Home (comment)   Treadmill and resistance training at home   Frequency Add 2 additional days to program exercise sessions.    Initial Home Exercises Provided 09/19/23      Oxygen   Maintain Oxygen Saturation 88% or higher             Nutrition:  Target Goals: Understanding of nutrition guidelines, daily intake of sodium 1500mg , cholesterol 200mg , calories 30% from fat and 7% or less from saturated fats, daily to have 5 or more servings of fruits and vegetables.  Education: All About Nutrition: -Group instruction provided by verbal, written material, interactive activities, discussions, models, and posters to present general guidelines for heart healthy nutrition including fat, fiber, MyPlate, the role of sodium in heart healthy nutrition, utilization of the nutrition label, and utilization of this knowledge for meal planning. Follow up email sent as well. Written material given at graduation. Flowsheet Row Cardiac Rehab from 10/17/2023 in  [facility] Cardiac and Pulmonary Rehab  Education need identified 08/01/23  Date 10/17/23  Educator JG part 2  Instruction Review Code 1- Verbalizes Understanding       Biometrics:  Pre Biometrics - 08/01/23 1054       Pre Biometrics   Height 5' 4.5" (1.638 m)    Weight 145 lb 11.2 oz (66.1 kg)    Waist Circumference 37.5 inches    Hip Circumference 39.5 inches    Waist to Hip Ratio 0.95 %    BMI (Calculated) 24.63    Single Leg Stand 30 seconds             Post Biometrics - 11/07/23 0945        Post  Biometrics   Height 5' 4.5" (1.638 m)    Weight 146 lb 4.8 oz (66.4 kg)    Waist Circumference 33 inches    Hip Circumference 35.5 inches    Waist to Hip Ratio 0.93 %    BMI (Calculated) 24.73    Single Leg Stand 30 seconds             Nutrition Therapy Plan and Nutrition Goals:  Nutrition Therapy & Goals - 08/27/23 1419       Nutrition Therapy   Diet Cardiac, Low Na    Protein (specify units) 75-90    Fiber 25 grams    Whole Grain Foods 3 servings    Saturated Fats 15 max. grams    Fruits and Vegetables  5 servings/day    Sodium 2 grams      Personal Nutrition Goals   Nutrition Goal Eat 15-30gProtein and 30-60gCarbs at each meal.    Personal Goal #2 Read labels and reduce sodium intake to below 2300mg . Ideally 1500mg  per day.    Personal Goal #3 Reduce saturated fat, less than 12g per day. Replace bad fats for more heart healthy fats.    Comments Patient drinking ~48oz of water. Some juice in small quantities. Rarely a tea or soda. She usually eats 3 times per day, but often eating out or getting pickup/delivery. Reviewed mediterranean diet handout, spoke about salt and sodium limits of below 1500mg  daily. Reviewed ways to cut back and stay below the limit most days. Educated on types of fats, sources, and how to read labels. Reviewed healthy plate sample handout and discussed the importance of balanced meals with both protein and complex carbs in controlled  portions and colorful produce to stay full.      Intervention Plan   Intervention Prescribe, educate and counsel regarding individualized specific dietary modifications aiming towards targeted core components such as weight, hypertension, lipid management, diabetes, heart failure and other comorbidities.;Nutrition handout(s) given to patient.    Expected Outcomes Short Term Goal: Understand basic principles of dietary content, such as calories, fat, sodium, cholesterol and nutrients.;Short Term Goal: A plan has been developed with personal nutrition goals set during dietitian appointment.;Long Term Goal: Adherence to prescribed nutrition plan.             Nutrition Assessments:  MEDIFICTS Score Key: >=70 Need to make dietary changes  40-70 Heart Healthy Diet <= 40 Therapeutic Level Cholesterol Diet  Flowsheet Row Cardiac Rehab from 08/01/2023 in [facility] Cardiac and Pul[facility] Rehab  Picture Your Plate Total Score on Admission 64      Picture Your Plate Scores: <16 Unhealthy dietary pattern with much room for improvement. 41-50 Dietary pattern unlikely to meet recommendations for good health and room for improvement. 51-60 More healthful dietary pattern, with some room for improvement.  >60 Healthy dietary pattern, although there may be some specific behaviors that could be improved.    Nutrition Goals Re-Evaluation:  Nutrition Goals Re-Evaluation     Row Name 08/23/23 0935 09/24/23 0950 10/17/23 0933 11/05/23 0935       Goals   Nutrition Goal -- -- -- Eat 15-30gProtein and 30-60gCarbs at each meal.    Comment She reports she is doing well with eating fruits but feels she could do better with eating more veggies. Commended her on including fruit and colorful produce in her diet. Spoke about her improving appetite and priortizing quality over quantity. Will do full assessment next week Guinevere reports that she is trying to read food labels and be aware of  her sodium and saturated fat  intake. She reports that her kidney funtion has been a concern and she started a new medication that with her research she feels she needs to limit protein intake. She was encouraged to talk to her doctor or phamacist to get clear guidance on what she can eat while taking this medication. Candias reports that she is doing well with her diet at this time. She is especially focusing on reducing sodium and increasing protein intake. She is trying to reduce saturated fat but states that fried foods are her favorite foods. She also states that she is drinking plenty of water. Anagabriela states that she has reduced her fried food intake to "occationally" . She does read food  labels and is watching her sodium intake. She has also tried to reduce snack foods. She feels like she is generally trying to eat healthier and is doin well with her dietary needs.    Expected Outcome STG: Include more veggies and attend RD appointment next week. LTG: follow heart healthy lifestyle Short: follow up with doctor to get guidance on protein intake with new meds. Long: maintain heart healthy diet. Short: Continue to limit saturated fat. Long: Continue to practice heart healthy eating patterns. Short: continue to limit fried foods. Long: maintain heart healthy diet.      Personal Goal #2 Re-Evaluation   Personal Goal #2 -- -- -- Read labels and reduce sodium intake to below 2300mg . Ideally 1500mg  per day.      Personal Goal #3 Re-Evaluation   Personal Goal #3 -- -- -- Reduce saturated fat, less than 12g per day. Replace bad fats for more heart healthy fats.             Nutrition Goals Discharge (Final Nutrition Goals Re-Evaluation):  Nutrition Goals Re-Evaluation - 11/05/23 0935       Goals   Nutrition Goal Eat 15-30gProtein and 30-60gCarbs at each meal.    Comment Carinne states that she has reduced her fried food intake to "occationally" . She does read food labels and is watching her sodium intake. She has also tried to  reduce snack foods. She feels like she is generally trying to eat healthier and is doin well with her dietary needs.    Expected Outcome Short: continue to limit fried foods. Long: maintain heart healthy diet.      Personal Goal #2 Re-Evaluation   Personal Goal #2 Read labels and reduce sodium intake to below 2300mg . Ideally 1500mg  per day.      Personal Goal #3 Re-Evaluation   Personal Goal #3 Reduce saturated fat, less than 12g per day. Replace bad fats for more heart healthy fats.             Psychosocial: Target Goals: Acknowledge presence or absence of significant depression and/or stress, maximize coping skills, provide positive support system. Participant is able to verbalize types and ability to use techniques and skills needed for reducing stress and depression.   Education: Stress, Anxiety, and Depression - Group verbal and visual presentation to define topics covered.  Reviews how body is impacted by stress, anxiety, and depression.  Also discusses healthy ways to reduce stress and to treat/manage anxiety and depression.  Written material given at graduation.   Education: Sleep Hygiene -Provides group verbal and written instruction about how sleep can affect your health.  Define sleep hygiene, discuss sleep cycles and impact of sleep habits. Review good sleep hygiene tips.    Initial Review & Psychosocial Screening:  Initial Psych Review & Screening - 06/18/23 0944       Initial Review   Current issues with None Identified      Family Dynamics   Good Support System? Yes   husband  Larinda Plover     Barriers   Psychosocial barriers to participate in program There are no identifiable barriers or psychosocial needs.      Screening Interventions   Interventions Encouraged to exercise;To provide support and resources with identified psychosocial needs;Provide feedback about the scores to participant    Expected Outcomes Short Term goal: Utilizing psychosocial counselor, staff  and physician to assist with identification of specific Stressors or current issues interfering with healing process. Setting desired goal for each stressor or current  issue identified.;Long Term Goal: Stressors or current issues are controlled or eliminated.;Short Term goal: Identification and review with participant of any Quality of Life or Depression concerns found by scoring the questionnaire.;Long Term goal: The participant improves quality of Life and PHQ9 Scores as seen by post scores and/or verbalization of changes             Quality of Life Scores:   Quality of Life - 08/01/23 1054       Quality of Life   Select Quality of Life      Quality of Life Scores   Health/Function Pre 22.5 %    Socioeconomic Pre 24.69 %    Psych/Spiritual Pre 25.36 %    Family Pre 30 %    GLOBAL Pre 24.64 %            Scores of 19 and below usually indicate a poorer quality of life in these areas.  A difference of  2-3 points is a clinically meaningful difference.  A difference of 2-3 points in the total score of the Quality of Life Index has been associated with significant improvement in overall quality of life, self-image, physical symptoms, and general health in studies assessing change in quality of life.  PHQ-9: Review Flowsheet       08/01/2023  Depression screen PHQ 2/9  Decreased Interest 0  Down, Depressed, Hopeless 0  PHQ - 2 Score 0  Altered sleeping 1  Tired, decreased energy 1  Change in appetite 0  Feeling bad or failure about yourself  0  Trouble concentrating 0  Moving slowly or fidgety/restless 0  Suicidal thoughts 0  PHQ-9 Score 2  Difficult doing work/chores Not difficult at all   Interpretation of Total Score  Total Score Depression Severity:  1-4 = Minimal depression, 5-9 = Mild depression, 10-14 = Moderate depression, 15-19 = Moderately severe depression, 20-27 = Severe depression   Psychosocial Evaluation and Intervention:  Psychosocial Evaluation -  06/18/23 0956       Psychosocial Evaluation & Interventions   Interventions Encouraged to exercise with the program and follow exercise prescription    Comments Sarynity has no barriers to attending the program. She lives with her husband and he is her support person.  She is ready to start the program after her home PT.    Expected Outcomes STG attends all scheduled sessions  LTG utilizes education and resources obtained during the program to contineu her healthy lifestyle steps.    Continue Psychosocial Services  Follow up required by staff             Psychosocial Re-Evaluation:  Psychosocial Re-Evaluation     Row Name 08/23/23 0940 09/24/23 0946 10/17/23 0927 11/05/23 0929       Psychosocial Re-Evaluation   Current issues with None Identified None Identified None Identified None Identified    Comments Jabria reports no depression, anxiety or stress. Says she has a good support system with her husband, friends and church. She says she wakes up a few times in the middle of the night but gets restful sleep and wakes up well rested most mornings. Kenzli reports no sleep or mental health concerns. She does have some health concerns with her kidney funtion related to a medication she was taking. She has been working closely with her doctor to adjust her meds and get to the root cause of what is going on. She reports that she has a good support system. Couture reports no major stressors at this  time. She enjoys watching HGTV and getting together with friends and family. She also enjoys attending church and traveling for stress relief. She also states that she is sleeping well at this time. Iraida states that her husband and friends make a good support system for her. Keshuna reports no changes or concerns with sleep, stress, or mental healh. She continues to have a good support system and has gone on several vacations lately that she has enjoyed.    Expected Outcomes STG: continue to attend rehab and use  support systems as needed. LTG: maintain positive outlook on health and daily life Short: continue to follow up with doctor closely about kidney health that his causing her some concern. Long: maintian good mental health routine. Short: Continue to relieve stress through healthy avenues. Long: Maintain positive outlook. Short: continue to exercise to benefit mental health. Long: maintian good mental health routine.    Interventions Encouraged to attend Cardiac Rehabilitation for the exercise Encouraged to attend Cardiac Rehabilitation for the exercise Encouraged to attend Cardiac Rehabilitation for the exercise Encouraged to attend Cardiac Rehabilitation for the exercise    Continue Psychosocial Services  Follow up required by staff Follow up required by staff Follow up required by staff Follow up required by staff             Psychosocial Discharge (Final Psychosocial Re-Evaluation):  Psychosocial Re-Evaluation - 11/05/23 0929       Psychosocial Re-Evaluation   Current issues with None Identified    Comments Ellora reports no changes or concerns with sleep, stress, or mental healh. She continues to have a good support system and has gone on several vacations lately that she has enjoyed.    Expected Outcomes Short: continue to exercise to benefit mental health. Long: maintian good mental health routine.    Interventions Encouraged to attend Cardiac Rehabilitation for the exercise    Continue Psychosocial Services  Follow up required by staff             Vocational Rehabilitation: Provide vocational rehab assistance to qualifying candidates.   Vocational Rehab Evaluation & Intervention:  Vocational Rehab - 06/18/23 0946       Initial Vocational Rehab Evaluation & Intervention   Assessment shows need for Vocational Rehabilitation No      Vocational Rehab Re-Evaulation   Comments retired             Education: Education Goals: Education classes will be provided on a variety  of topics geared toward better understanding of heart health and risk factor modification. Participant will state understanding/return demonstration of topics presented as noted by education test scores.  Learning Barriers/Preferences:  Learning Barriers/Preferences - 06/18/23 0945       Learning Barriers/Preferences   Learning Barriers Hearing   wears hearing aid   Learning Preferences None             General Cardiac Education Topics:  AED/CPR: - Group verbal and written instruction with the use of models to demonstrate the basic use of the AED with the basic ABC's of resuscitation.   Anatomy and Cardiac Procedures: - Group verbal and visual presentation and models provide information about basic cardiac anatomy and function. Reviews the testing methods done to diagnose heart disease and the outcomes of the test results. Describes the treatment choices: Medical Management, Angioplasty, or Coronary Bypass Surgery for treating various heart conditions including Myocardial Infarction, Angina, Valve Disease, and Cardiac Arrhythmias.  Written material given at graduation. Flowsheet Row Cardiac Rehab from 10/17/2023  in [facility] Cardiac [facility] Pulmonary Rehab  Date 08/15/23  Educator sb  Instruction Review Code 1- Verbalizes Understanding       Medication Safety: - Group verbal and visual instruction to review commonly prescribed medications for heart and lung disease. Reviews the medication, class of the drug, and side effects. Includes the steps to properly store meds and maintain the prescription regimen.  Written material given at graduation.   Intimacy: - Group verbal instruction through game format to discuss how heart and lung disease can affect sexual intimacy. Written material given at graduation.. Flowsheet Row Cardiac Rehab from 10/17/2023 in [facility] Cardiac and Pulmonary Rehab  Date 10/03/23  Educator NT  Instruction Review Code 1- Verbalizes Understanding       Know Your  Numbers and Heart Failure: - Group verbal and visual instruction to discuss disease risk factors for cardiac and pulmonary disease and treatment options.  Reviews associated critical values for Overweight/Obesity, Hypertension, Cholesterol, and Diabetes.  Discusses basics of heart failure: signs/symptoms and treatments.  Introduces Heart Failure Zone chart for action plan for heart failure.  Written material given at graduation. Flowsheet Row Cardiac Rehab from 10/17/2023 in [facility] Cardiac and Pulmonary Rehab  Date 08/29/23  Educator [facility]  Instruction Review Code 1- Verbalizes Understanding       Infection Prevention: - Provides verbal and written material to individual with discussion of infection control including proper hand washing and proper equipment cleaning during exercise session. Flowsheet Row Cardiac Rehab from 10/17/2023 in [facility] Cardiac and Pulmonary Rehab  Date 08/01/23  Educator MB  Instruction Review Code 1- Verbalizes Understanding       Falls Prevention: - Provides verbal and written material to individual with discussion of falls prevention and safety. Flowsheet Row Cardiac Rehab from 10/17/2023 in [facility] Cardiac and Pulmonary Rehab  Date 08/01/23  Educator MB  Instruction Review Code 1- Verbalizes Understanding       Other: -Provides group and verbal instruction on various topics (see comments)   Knowledge Questionnaire Score:  Knowledge Questionnaire Score - 08/01/23 1059       Knowledge Questionnaire Score   Pre Score 25/26             Core Components/Risk Factors/Patient Goals at Admission:  Personal Goals and Risk Factors at Admission - 08/01/23 1059       Core Components/Risk Factors/Patient Goals on Admission    Weight Management Yes;Weight Maintenance    Intervention Weight Management: Develop a combined nutrition and exercise program designed to reach desired caloric intake, while maintaining appropriate intake of nutrient and fiber, sodium and  fats, and appropriate energy expenditure required for the weight goal.;Weight Management: Provide education and appropriate resources to help participant work on and attain dietary goals.    Admit Weight 145 lb 11.2 oz (66.1 kg)    Goal Weight: Short Term 145 lb (65.8 kg)    Goal Weight: Long Term 145 lb (65.8 kg)    Expected Outcomes Short Term: Continue to assess and modify interventions until short term weight is achieved;Long Term: Adherence to nutrition and physical activity/exercise program aimed toward attainment of established weight goal;Weight Maintenance: Understanding of the daily nutrition guidelines, which includes 25-35% calories from fat, 7% or less cal from saturated fats, less than 200mg  cholesterol, less than 1.5gm of sodium, & 5 or more servings of fruits and vegetables daily;Understanding recommendations for meals to include 15-35% energy as protein, 25-35% energy from fat, 35-60% energy from carbohydrates, less than 200mg  of dietary cholesterol, 20-35 gm of  total fiber daily;Understanding of distribution of calorie intake throughout the day with the consumption of 4-5 meals/snacks    Hypertension Yes    Intervention Provide education on lifestyle modifcations including regular physical activity/exercise, weight management, moderate sodium restriction and increased consumption of fresh fruit, vegetables, and low fat dairy, alcohol moderation, and smoking cessation.;Monitor prescription use compliance.    Expected Outcomes Short Term: Continued assessment and intervention until BP is < 140/75mm HG in hypertensive participants. < 130/35mm HG in hypertensive participants with diabetes, heart failure or chronic kidney disease.;Long Term: Maintenance of blood pressure at goal levels.             Education:Diabetes - Individual verbal and written instruction to review signs/symptoms of diabetes, desired ranges of glucose level fasting, after meals and with exercise. Acknowledge that  pre and post exercise glucose checks will be done for 3 sessions at entry of program.   Core Components/Risk Factors/Patient Goals Review:   Goals and Risk Factor Review     Row Name 08/23/23 0931 09/24/23 1610 10/17/23 0935 11/05/23 0930       Core Components/Risk Factors/Patient Goals Review   Personal Goals Review Weight Management/Obesity Hypertension Weight Management/Obesity;Hypertension Weight Management/Obesity;Hypertension    Review She reports she lost her appetite after her surgery and lost ~15lbs. But she has since regained her appetite and stablized her weight. Spoke about ensuring her body is strong regardless of the number on the scale. She agrees and will continue to attend rehab to improve strength and stamina. Caron states that she does monitor BP at home but gets much higher numbers then we have been getting while monitoring it in cardiac rehab class. She was concerned about her personal BP cuff's accuracy and she was encouraged to bring it into class so we could check in against the BP we are taking in class. Louisa states that she is comfortable with where her weight is at this time. She is not actively trying to lose weight but states that she would not mind losing a few pounds as a by product of her diet and exercise. She states that her BP has stayed within normal ranges as she is checking it once a day shortly after taking her medications. She reports continuing taking all of her medications as prescribed. Caylani continues to maintain weight and has increased her strength since starting cardiac rehab. She reports that she continues to take all BP meds and montiors her BP at home. She will continue to monitor BP and inform doctor is she notices any changes.    Expected Outcomes STG: attend rehab and meet nutrtional needs to maintain weight. LTG: achieve and maintain healthy weight Short: bring BP cuff into class for staff to check accuacy. Long: maintian good BP and continue to take  meds and follow up with doctor if there are any concerns. Short: Continue to monitor BP at home. Long: Continue to manage lifestyle risk factors. Short: contineu to montor BP at home and maintain weight. Long: continue to control cardiac risk factors.             Core Components/Risk Factors/Patient Goals at Discharge (Final Review):   Goals and Risk Factor Review - 11/05/23 0930       Core Components/Risk Factors/Patient Goals Review   Personal Goals Review Weight Management/Obesity;Hypertension    Review Dorcie continues to maintain weight and has increased her strength since starting cardiac rehab. She reports that she continues to take all BP meds and montiors her BP at  home. She will continue to monitor BP and inform doctor is she notices any changes.    Expected Outcomes Short: contineu to montor BP at home and maintain weight. Long: continue to control cardiac risk factors.             ITP Comments:  ITP Comments     Row Name 06/18/23 0954 08/01/23 1047 08/06/23 0907 08/29/23 1140 09/26/23 1043   ITP Comments Virtual orientation call completed today. shehas an appointment on Date: will schedule for Jan after [facility] PT completed  for EP eval and gym Orientation.  Documentation of diagnosis can be found in [facility] Date: 05/22/2023 . Completed and gym orientation. Initial ITP created and sent for review to Dr. Firman Hughes, Medical Director. First full day of exercise!  Patient was oriented to gym and equipment including functions, settings, policies, and procedures.  Patient's individual exercise prescription and treatment plan were reviewed.  All starting workloads were established based on the results of the 6 minute walk test done at initial orientation visit.  The plan for exercise progression was also introduced and progression will be customized based on patient's performance and goals. 30 Day review completed. Medical Director ITP review done, changes made as directed, and signed  approval by Medical Director.    new to program 30 Day review completed. Medical Director ITP review done, changes made as directed, and signed approval by Medical Director.    Row Name 10/24/23 [phone] 11/21/23 0954         ITP Comments 30 Day review completed. Medical Director ITP review done, changes made as directed, and signed approval by Medical Director. Ashelly graduated today from  rehab with 35 sessions completed.  Details of the patient's exercise prescription and what She needs to do in order to continue the prescription and progress were discussed with patient.  Patient was given a copy of prescription and goals.  Patient verbalized understanding. Sianni plans to continue to exercise by by using TM at home.               Comments: Discharge ITP

## 2023-11-21 NOTE — Progress Notes (Signed)
 Discharge Note for  [name]     12-01-1955          [name] graduated today from  rehab with 35 sessions completed.  Details of the patient's exercise prescription and what She needs to do in order to continue the prescription and progress were discussed with patient.  Patient was given a copy of prescription and goals.  Patient verbalized understanding. [name] plans to continue to exercise by by using TM at home.    6 Minute Walk     Row Name 08/01/23 1048 11/07/23 0943       6 Minute Walk   Phase Initial Discharge    Distance 1085 feet 1620 feet    Distance % Change -- 49.3 %    Distance Feet Change -- 535 ft    Walk Time 6 minutes 6 minutes    # of Rest Breaks 0 0    MPH 2.05 3.07    METS 2.69 3.67    RPE 12 13    Perceived Dyspnea  1 1    VO2 Peak 9.41 12.86    Symptoms Yes (comment) No    Comments Bilateral hip pain 7/10 at end of test --    Resting HR 76 bpm 74 bpm    Resting BP 138/84 102/60    Resting Oxygen Saturation  98 % 97 %    Exercise Oxygen Saturation  during 6 min walk 96 % 96 %    Max Ex. HR 81 bpm 93 bpm    Max Ex. BP 144/80 134/68    2 Minute Post BP 130/80 --

## 2023-11-21 NOTE — Progress Notes (Signed)
 Daily Session Note  Patient Details  Name: [name] MRN: [id number] Date of Birth: 1956/03/06 Referring Provider:   Flowsheet Row Cardiac Rehab from 08/01/2023 in [facility] Cardiac and Pulmonary Rehab  Referring Provider Rae Bugler, MD       Encounter Date: 11/21/2023  Check In:  Session Check In - 11/21/23 0952       Check-In   Supervising physician immediately available to respond to emergencies See telemetry face sheet for immediately available ER MD    Location [facility]-Cardiac & Pulmonary Rehab    Staff Present Balinda Level, BS, RRT, CPFT;Maxon Conetta BS, Exercise Physiologist;Meredith Manson Seitz RN,BSN;[employer];Maud Sorenson, RN, BSN, CCRP    Virtual Visit No    Medication changes reported     No    Fall or balance concerns reported    No    Warm-up and Cool-down Performed on first and last piece of equipment    Resistance Training Performed Yes    VAD Patient? No    PAD/SET Patient? No      Pain Assessment   Currently in Pain? No/denies                Social History   Tobacco Use  Smoking Status Former   Current packs/day: 0.00   Types: Cigarettes   Quit date: 02/13/1974   Years since quitting: 49.8  Smokeless Tobacco Never    Goals Met:  Independence with exercise equipment Exercise tolerated well No report of concerns or symptoms today  Goals Unmet:  Not Applicable  Comments: Pt able to follow exercise prescription today without complaint.    Shanetha graduated today from  rehab with 35 sessions completed.  Details of the patient's exercise prescription and what She needs to do in order to continue the prescription and progress were discussed with patient.  Patient was given a copy of prescription and goals.  Patient verbalized understanding. [name] plans to continue to exercise by by using TM at home.   Dr. Firman Hughes is Medical Director for [facility] Cardiac Rehabilitation.  Dr. Fuad Aleskerov is Medical Director for [facility]  Pulmonary Rehabilitation.

## 2023-12-17 NOTE — Progress Notes (Signed)
 Cardiology Office Note    Patient Name: [name] Date of Encounter: 12/17/2023  Primary Care Provider:  Rae Bugler, MD Primary Cardiologist:  None Primary Electrophysiologist: None   Past Medical History    Past Medical History:  Diagnosis Date   Actinic keratosis    Arthritis    Diverticulosis    Dysplastic nevus 09/27/2009   Left post waistline. Mild atypia. Limited margins free.   History of gallstones    History of hiatal hernia    Hx of basal cell carcinoma 06/18/2012   L bra line infrascapular   Hx of squamous cell carcinoma of skin    multiple sites   Hypertension    Peptic ulcer disease 1991   Sleep apnea    cpap   Squamous cell carcinoma in situ (SCCIS) 04/26/2022   left abdomen ED&C 07/03/22   Squamous cell carcinoma of skin 05/07/2007   Right medial superior breast. SCCis   Squamous cell carcinoma of skin 06/09/2013   Right medial calf. WD SCC. Recurrent 07/28/2013. Tx: EDC   Squamous cell carcinoma of skin 01/13/2014   Left medial pretibial. SCC, KA pattern   Squamous cell carcinoma of skin 09/06/2016   Left medial lower leg. WD SCC   Squamous cell carcinoma of skin 11/07/2017   Right distal pretibial. WD SCC. Tx: EDC   Squamous cell carcinoma of skin 07/03/2022   Left pretibia. EDC    History of Present Illness  [name] is a 68 y.o. female with a PMH of type aortic dissection AVR freestyle valve implant and ascending arch repair 05/2024, OSA, HTN, PUD, arthritis, squamous cell carcinoma in situ who presents today for 65-month follow-up.  [name] history of type a aortic dissection s/p aortic root replacement with freestyle replacement and hemiarch completed at [facility]  on 05/29/2023.  Her postop course was complicated by ileus and hypokalemia and she was discharged on postop day 7.  She was seen initially by Dr. Annabelle Berry on 08/02/2023 to establish care following aortic repair.  She was noted to have uncontrolled hypertension with a  family history of HTN with no family history of aortic aneurysms or dissections.  During her visit her blood pressure was 147/89 and patient was currently on metoprolol, losartan  and triamterene HCTZ.  She had losartan  increased to 100 mg with plan to follow stepwise approach to adjusting medications with escalation of ARB to irbesartan or valsartan and possibly Entresto if needed.  She was referred to Pharm.D. for follow-up of BP and was seen on 09/13/2023.  During visit patient's blood pressure was controlled at 118/80 and patient noted dizziness with position changes mild shortness of breath with exertion.  She was last seen on 11/01/2023 and blood pressure was controlled at Pharm.D. at 115/64.  She reported exercising with cardiac rehab 3 times per week.  She was advised to take amlodipine  2.5 mg twice daily instead of 5 mg in AM.  [name] presents today for 36-month follow-up.  During today's visit her blood pressure was stable at 128/88.  She was advised to take amlodipine  2.5 mg twice a day instead of 5 mg once in the morning to help with dizziness. She took the amlodipine  twice a day for a while, but noticed her blood pressure 'creeping up' and started taking 5 mg in the morning instead. She reports improvement in dizziness since making this change.Her losartan  was discontinued after her kidney function worsened while on the medication. She experienced hair loss and did not want  to continue with losartan . Her current medications include triamterene and amlodipine , with recent adjustments to her carvedilol  dosage. She monitors her blood pressure at home, noting fluctuations and challenges in maintaining a consistent measurement routine due to her lifestyle. She has a family history of hypertension and has been managing her condition with lifestyle modifications, including exercise at cardiac rehab three times a week. She follows a low-sodium diet and is aware of the importance of diet in managing her  blood pressure. She also has sleep apnea and uses a CPAP machine to manage it.  Patient denies chest pain, palpitations, dyspnea, PND, orthopnea, nausea, vomiting, dizziness, syncope, edema, weight gain, or early satiety.  Discussed the use of AI scribe software for clinical note transcription with the patient, who gave verbal consent to proceed.  History of Present Illness   Review of Systems  Please see the history of present illness.    All other systems reviewed and are otherwise negative except as noted above.  Physical Exam     Wt Readings from Last 3 Encounters:  11/07/23 146 lb 4.8 oz (66.4 kg)  08/02/23 143 lb (64.9 kg)  08/01/23 145 lb 11.2 oz (66.1 kg)   ZO:[id number] were no vitals filed for this visit.,There is no height or weight on file to calculate BMI. GEN: Well nourished, well developed in no acute distress Neck: No JVD; No carotid bruits Pulmonary: Clear to auscultation without rales, wheezing or rhonchi  Cardiovascular: Normal rate. Regular rhythm. Normal S1. Normal S2.   Murmurs: There is no murmur.  ABDOMEN: Soft, non-tender, non-distended EXTREMITIES:  No edema; No deformity   EKG/LABS/ Recent Cardiac Studies   ECG personally reviewed by me today -none completed today  Risk Assessment/Calculations:          Lab Results  Component Value Date   WBC 6.7 05/22/2023   HGB 13.6 05/22/2023   HCT 40.0 05/22/2023   MCV 92.9 05/22/2023   PLT 228 05/22/2023   Lab Results  Component Value Date   CREATININE 1.05 (H) 11/01/2023   BUN 18 11/01/2023   NA 140 11/01/2023   K 3.5 11/01/2023   CL 98 11/01/2023   CO2 26 11/01/2023   No results found for: "CHOL", "HDL", "LDLCALC", "LDLDIRECT", "TRIG", "CHOLHDL"  No results found for: "HGBA1C" Assessment & Plan    Assessment & Plan  1.  History of type a aortic dissection: -s/p aortic root replacement with freestyle replacement and hemiarch completed at [facility]  on 05/29/2023 - SBE prophylaxis discussed -Patient  is asymptomatic with no murmur present on auscultation. - Continue annual echocardiograms to monitor valve function. - Prescribe prophylactic antibiotics before dental procedures.  2.  Primary hypertension: -Hypertension well-controlled with amlodipine  and carvedilol . Discussed antihypertensive mechanisms, kidney function monitoring, and DASH diet. Emphasized 24-hour blood pressure monitoring. - Increase carvedilol  to 25 mg twice daily. - Monitor for side effects of carvedilol , including fatigue, dizziness, and constipation. - Encourage adherence to the DASH diet to manage hypertension. - Check blood pressure in the evening to ensure 24-hour control. - Order basic metabolic panel (BMP) to monitor kidney function and potassium levels.  3.  History of OSA: - Patient reports compliance with CPAP and understands its importance and decreasing progression of aortic valve disease and hypertension.  4.  History of PUD: - Continue current treatment plan per PCP and GI  Disposition: Follow-up with None or APP in 6 months    Signed, Francene Ing, Retha Cast, NP 12/17/2023, 8:28 AM [facility]  Heart Care

## 2023-12-18 ENCOUNTER — Encounter: Payer: Self-pay | Admitting: Nurse Practitioner

## 2023-12-18 ENCOUNTER — Ambulatory Visit: Payer: Medicare Other | Attending: Nurse Practitioner | Admitting: Nurse Practitioner

## 2023-12-18 VITALS — BP 128/88 | HR 71 | Ht 64.5 in | Wt 144.8 lb

## 2023-12-18 DIAGNOSIS — I10 Essential (primary) hypertension: Secondary | ICD-10-CM

## 2023-12-18 DIAGNOSIS — G4733 Obstructive sleep apnea (adult) (pediatric): Secondary | ICD-10-CM

## 2023-12-18 DIAGNOSIS — Z8679 Personal history of other diseases of the circulatory system: Secondary | ICD-10-CM

## 2023-12-18 DIAGNOSIS — K279 Peptic ulcer, site unspecified, unspecified as acute or chronic, without hemorrhage or perforation: Secondary | ICD-10-CM | POA: Diagnosis not present

## 2023-12-18 MED ORDER — CARVEDILOL 25 MG PO TABS: 25.0000 mg | ORAL_TABLET | Freq: Two times a day (BID) | ORAL | 3 refills | Status: DC

## 2023-12-18 NOTE — Patient Instructions (Addendum)
 Medication Instructions:  START Coreg  to 25mg  tablet Take 1 tablet twice a day  Contact the office if you experience fatigue, dizziness and constipation *If you need a refill on your cardiac medications before your next appointment, please call your pharmacy*  Lab Work: TODAY-BMET If you have labs (blood work) drawn today and your tests are completely normal, you will receive your results only by: MyChart Message (if you have MyChart) OR A paper copy in the mail If you have any lab test that is abnormal or we need to change your treatment, we will call you to review the results.  Testing/Procedures: NONE ORDERED  Follow-Up: At [facility], you and your health needs are our priority.  As part of our continuing mission to provide you with exceptional heart care, our providers are all part of one team.  This team includes your primary Cardiologist (physician) and Advanced Practice Providers or APPs (Physician Assistants and Nurse Practitioners) who all work together to provide you with the care you need, when you need it.  Your next appointment:   6 month(s)  Provider:   Gloriann Larger, MD  We recommend signing up for the patient portal called "MyChart".  Sign up information is provided on this After Visit Summary.  MyChart is used to connect with patients for Virtual Visits (Telemedicine).  Patients are able to view lab/test results, encounter notes, upcoming appointments, etc.  Non-urgent messages can be sent to your provider as well.   To learn more about what you can do with MyChart, go to [email or web address].   Other Instructions DASH DIET

## 2023-12-19 ENCOUNTER — Ambulatory Visit: Payer: Self-pay | Admitting: Nurse Practitioner

## 2023-12-19 DIAGNOSIS — E876 Hypokalemia: Secondary | ICD-10-CM

## 2023-12-19 LAB — BASIC METABOLIC PANEL WITH GFR
BUN/Creatinine Ratio: 18 (ref 12–28)
BUN: 17 mg/dL (ref 8–27)
CO2: 26 mmol/L (ref 20–29)
Calcium: 10.3 mg/dL (ref 8.7–10.3)
Chloride: 95 mmol/L — ABNORMAL LOW (ref 96–106)
Creatinine, Ser: 0.95 mg/dL (ref 0.57–1.00)
Glucose: 91 mg/dL (ref 70–99)
Potassium: 3.4 mmol/L — ABNORMAL LOW (ref 3.5–5.2)
Sodium: 138 mmol/L (ref 134–144)
eGFR: 66 mL/min/{1.73_m2} (ref >59)

## 2023-12-19 MED ORDER — POTASSIUM CHLORIDE CRYS ER 20 MEQ PO TBCR: 20.0000 meq | EXTENDED_RELEASE_TABLET | Freq: Every day | ORAL | 1 refills | Status: DC

## 2024-01-09 ENCOUNTER — Other Ambulatory Visit: Payer: Self-pay

## 2024-01-09 DIAGNOSIS — E876 Hypokalemia: Secondary | ICD-10-CM

## 2024-01-11 DIAGNOSIS — E876 Hypokalemia: Secondary | ICD-10-CM | POA: Diagnosis not present

## 2024-01-12 LAB — BASIC METABOLIC PANEL WITH GFR
BUN/Creatinine Ratio: 15 (ref 12–28)
BUN: 15 mg/dL (ref 8–27)
CO2: 22 mmol/L (ref 20–29)
Calcium: 9.5 mg/dL (ref 8.7–10.3)
Chloride: 102 mmol/L (ref 96–106)
Creatinine, Ser: 0.98 mg/dL (ref 0.57–1.00)
Glucose: 87 mg/dL (ref 70–99)
Potassium: 3.5 mmol/L (ref 3.5–5.2)
Sodium: 143 mmol/L (ref 134–144)
eGFR: 63 mL/min/{1.73_m2} (ref >59)

## 2024-01-13 ENCOUNTER — Ambulatory Visit: Payer: Self-pay | Admitting: Nurse Practitioner

## 2024-01-14 NOTE — Telephone Encounter (Signed)
 Spoke with patient about recent lab work, explained fluxuations are normal but her kidney function was not concerning and Jackee recommends her to continue medication as prescribed. No further needs

## 2024-01-22 DIAGNOSIS — L989 Disorder of the skin and subcutaneous tissue, unspecified: Secondary | ICD-10-CM | POA: Diagnosis not present

## 2024-01-22 DIAGNOSIS — G4733 Obstructive sleep apnea (adult) (pediatric): Secondary | ICD-10-CM | POA: Diagnosis not present

## 2024-01-22 DIAGNOSIS — J341 Cyst and mucocele of nose and nasal sinus: Secondary | ICD-10-CM | POA: Diagnosis not present

## 2024-01-31 DIAGNOSIS — L723 Sebaceous cyst: Secondary | ICD-10-CM | POA: Diagnosis not present

## 2024-02-11 ENCOUNTER — Other Ambulatory Visit (HOSPITAL_BASED_OUTPATIENT_CLINIC_OR_DEPARTMENT_OTHER): Payer: Self-pay

## 2024-02-12 DIAGNOSIS — K080 Exfoliation of teeth due to systemic causes: Secondary | ICD-10-CM | POA: Diagnosis not present

## 2024-03-24 DIAGNOSIS — D140 Benign neoplasm of middle ear, nasal cavity and accessory sinuses: Secondary | ICD-10-CM | POA: Diagnosis not present

## 2024-03-24 DIAGNOSIS — J310 Chronic rhinitis: Secondary | ICD-10-CM | POA: Diagnosis not present

## 2024-03-24 DIAGNOSIS — L720 Epidermal cyst: Secondary | ICD-10-CM | POA: Diagnosis not present

## 2024-04-07 DIAGNOSIS — K080 Exfoliation of teeth due to systemic causes: Secondary | ICD-10-CM | POA: Diagnosis not present

## 2024-04-09 MED ORDER — CARVEDILOL 25 MG PO TABS: 25.0000 mg | ORAL_TABLET | Freq: Two times a day (BID) | ORAL | 2 refills | Status: DC

## 2024-04-21 DIAGNOSIS — D649 Anemia, unspecified: Secondary | ICD-10-CM | POA: Diagnosis not present

## 2024-04-21 DIAGNOSIS — M8588 Other specified disorders of bone density and structure, other site: Secondary | ICD-10-CM | POA: Diagnosis not present

## 2024-04-21 DIAGNOSIS — Z0000 Encounter for general adult medical examination without abnormal findings: Secondary | ICD-10-CM | POA: Diagnosis not present

## 2024-04-21 DIAGNOSIS — K219 Gastro-esophageal reflux disease without esophagitis: Secondary | ICD-10-CM | POA: Diagnosis not present

## 2024-04-21 DIAGNOSIS — I10 Essential (primary) hypertension: Secondary | ICD-10-CM | POA: Diagnosis not present

## 2024-04-21 DIAGNOSIS — Z23 Encounter for immunization: Secondary | ICD-10-CM | POA: Diagnosis not present

## 2024-04-21 DIAGNOSIS — Z1331 Encounter for screening for depression: Secondary | ICD-10-CM | POA: Diagnosis not present

## 2024-04-21 DIAGNOSIS — E7800 Pure hypercholesterolemia, unspecified: Secondary | ICD-10-CM | POA: Diagnosis not present

## 2024-04-22 ENCOUNTER — Ambulatory Visit (INDEPENDENT_AMBULATORY_CARE_PROVIDER_SITE_OTHER): Admitting: Dermatology

## 2024-04-22 ENCOUNTER — Encounter: Payer: Self-pay | Admitting: Dermatology

## 2024-04-22 DIAGNOSIS — L814 Other melanin hyperpigmentation: Secondary | ICD-10-CM

## 2024-04-22 DIAGNOSIS — D229 Melanocytic nevi, unspecified: Secondary | ICD-10-CM

## 2024-04-22 DIAGNOSIS — W908XXA Exposure to other nonionizing radiation, initial encounter: Secondary | ICD-10-CM | POA: Diagnosis not present

## 2024-04-22 DIAGNOSIS — L820 Inflamed seborrheic keratosis: Secondary | ICD-10-CM

## 2024-04-22 DIAGNOSIS — Z1283 Encounter for screening for malignant neoplasm of skin: Secondary | ICD-10-CM | POA: Diagnosis not present

## 2024-04-22 DIAGNOSIS — D1801 Hemangioma of skin and subcutaneous tissue: Secondary | ICD-10-CM

## 2024-04-22 DIAGNOSIS — L821 Other seborrheic keratosis: Secondary | ICD-10-CM

## 2024-04-22 DIAGNOSIS — Z85828 Personal history of other malignant neoplasm of skin: Secondary | ICD-10-CM

## 2024-04-22 DIAGNOSIS — Z7189 Other specified counseling: Secondary | ICD-10-CM

## 2024-04-22 DIAGNOSIS — Z86007 Personal history of in-situ neoplasm of skin: Secondary | ICD-10-CM

## 2024-04-22 DIAGNOSIS — L570 Actinic keratosis: Secondary | ICD-10-CM | POA: Diagnosis not present

## 2024-04-22 DIAGNOSIS — Z86018 Personal history of other benign neoplasm: Secondary | ICD-10-CM

## 2024-04-22 DIAGNOSIS — D692 Other nonthrombocytopenic purpura: Secondary | ICD-10-CM

## 2024-04-22 DIAGNOSIS — L578 Other skin changes due to chronic exposure to nonionizing radiation: Secondary | ICD-10-CM

## 2024-04-22 LAB — LAB REPORT - SCANNED: EGFR: 75

## 2024-04-22 NOTE — Progress Notes (Signed)
 Follow-Up Visit   Subjective  [name] is a 68 y.o. female who presents for the following: Skin Cancer Screening and Full Body Skin Exam. HxBCC, SCC, SCCIS, Aks,  and dysplastic nevi. Patient states she did not use imiquimod , she did pick up from pharmacy. Patient used 5FU from SM at legs and right cheek with good reaction.    The patient presents for Total-Body Skin Exam (TBSE) for skin cancer screening and mole check. The patient has spots, moles and lesions to be evaluated, some may be new or changing and the patient may have concern these could be cancer.   The following portions of the chart were reviewed this encounter and updated as appropriate: medications, allergies, medical history  Review of Systems:  No other skin or systemic complaints except as noted in HPI or Assessment and Plan.  Objective  Well appearing patient in no apparent distress; mood and affect are within normal limits.  A full examination was performed including scalp, head, eyes, ears, nose, lips, neck, chest, axillae, abdomen, back, buttocks, bilateral upper extremities, bilateral lower extremities, hands, feet, fingers, toes, fingernails, and toenails. All findings within normal limits unless otherwise noted below.   Relevant physical exam findings are noted in the Assessment and Plan.    Assessment & Plan   SKIN CANCER SCREENING PERFORMED TODAY.  ACTINIC DAMAGE WITH PRECANCEROUS ACTINIC KERATOSES Counseling for Topical Chemotherapy Management: Patient exhibits: - Severe, confluent actinic changes with pre-cancerous actinic keratoses that is secondary to cumulative UV radiation exposure over time - Condition that is severe; chronic, not at goal. - diffuse scaly erythematous macules and papules with underlying dyspigmentation - Discussed Prescription Field Treatment topical Chemotherapy for Severe, Chronic Confluent Actinic Changes with Pre-Cancerous Actinic Keratoses Field treatment involves  treatment of an entire area of skin that has confluent Actinic Changes (Sun/ Ultraviolet light damage) and PreCancerous Actinic Keratoses by method of PhotoDynamic Therapy (PDT) and/or prescription Topical Chemotherapy agents such as 5-fluorouracil , 5-fluorouracil /calcipotriene , and/or imiquimod .  The purpose is to decrease the number of clinically evident and subclinical PreCancerous lesions to prevent progression to development of skin cancer by chemically destroying early precancer changes that may or may not be visible.  It has been shown to reduce the risk of developing skin cancer in the treated area. As a result of treatment, redness, scaling, crusting, and open sores may occur during treatment course. One or more than one of these methods may be used and may have to be used several times to control, suppress and eliminate the PreCancerous changes. Discussed treatment course, expected reaction, and possible side effects. - Recommend daily broad spectrum sunscreen SPF 30+ to sun-exposed areas, reapply every 2 hours as needed.  - Staying in the shade or wearing long sleeves, sun glasses (UVA+UVB protection) and wide brim hats (4-inch brim around the entire circumference of the hat) are also recommended. - Call for new or changing lesions. - STOP treatment on legs and allow inflamed AKs to heal. Then reassess and treat if scaly spots persist - continue 5-fluorouracil /calcipotriene  cream, treat arms and hands. Legs after this if needed. Any focal lesions on face and chest, beginning of next year in January.   Reviewed course of treatment and expected reaction.  Patient advised to expect inflammation and crusting and advised that erosions are possible.  Patient advised to be diligent with sun protection during and after treatment. Handout with details of how to apply medication and what to expect provided. Counseled to keep medication out of reach  of children and pets.  LENTIGINES, SEBORRHEIC KERATOSES,  HEMANGIOMAS - Benign normal skin lesions - Benign-appearing - Call for any changes  MELANOCYTIC NEVI - Tan-brown and/or pink-flesh-colored symmetric macules and papules - Benign appearing on exam today - Observation - Call clinic for new or changing moles - Recommend daily use of broad spectrum spf 30+ sunscreen to sun-exposed areas.   HISTORY OF SQUAMOUS CELL CARCINOMA OF THE SKIN Multiple see history - No evidence of recurrence today - No lymphadenopathy - Recommend regular full body skin exams - Recommend daily broad spectrum sunscreen SPF 30+ to sun-exposed areas, reapply every 2 hours as needed.  - Call if any new or changing lesions are noted between office visits  HISTORY OF SQUAMOUS CELL CARCINOMA IN SITU OF THE SKIN Left abdomen- [facility] 07/03/2022 - No evidence of recurrence today - Recommend regular full body skin exams - Recommend daily broad spectrum sunscreen SPF 30+ to sun-exposed areas, reapply every 2 hours as needed.  - Call if any new or changing lesions are noted between office visits  HISTORY OF BASAL CELL CARCINOMA OF THE SKIN Left bra line infrascapular- 06/18/2012 - No evidence of recurrence today - Recommend regular full body skin exams - Recommend daily broad spectrum sunscreen SPF 30+ to sun-exposed areas, reapply every 2 hours as needed.  - Call if any new or changing lesions are noted between office visits  HISTORY OF DYSPLASTIC NEVUS Left post waistline, mild atypia- 09/27/2009 No evidence of recurrence today Recommend regular full body skin exams Recommend daily broad spectrum sunscreen SPF 30+ to sun-exposed areas, reapply every 2 hours as needed.  Call if any new or changing lesions are noted between office visits   ACTINIC KERATOSES   MULTIPLE BENIGN NEVI   LENTIGINES   SEBORRHEIC KERATOSES   ACTINIC ELASTOSIS   CHERRY ANGIOMA   INFLAMED SEBORRHEIC KERATOSIS   SOLAR PURPURA   Return in about 6 months (around 10/21/2024) for  TBSE, HxBCC, HxSCC, HxSCCIS, HxDysplastic Nevi, w/ Dr. Claudene.  I, Jacquelynn V. Wilfred, CMA, am acting as scribe for Boneta Claudene, MD .   Documentation: I have reviewed the above documentation for accuracy and completeness, and I agree with the above.  Boneta Claudene, MD

## 2024-04-22 NOTE — Patient Instructions (Addendum)

## 2024-04-23 DIAGNOSIS — K080 Exfoliation of teeth due to systemic causes: Secondary | ICD-10-CM | POA: Diagnosis not present

## 2024-04-24 ENCOUNTER — Ambulatory Visit: Payer: Self-pay | Admitting: Nurse Practitioner

## 2024-04-24 ENCOUNTER — Other Ambulatory Visit: Payer: Self-pay | Admitting: Nurse Practitioner

## 2024-04-28 ENCOUNTER — Other Ambulatory Visit: Payer: Self-pay | Admitting: Family Medicine

## 2024-04-28 DIAGNOSIS — M8588 Other specified disorders of bone density and structure, other site: Secondary | ICD-10-CM

## 2024-05-05 DIAGNOSIS — K52832 Lymphocytic colitis: Secondary | ICD-10-CM | POA: Diagnosis not present

## 2024-05-05 DIAGNOSIS — E876 Hypokalemia: Secondary | ICD-10-CM | POA: Diagnosis not present

## 2024-05-12 ENCOUNTER — Other Ambulatory Visit: Payer: Self-pay | Admitting: Family Medicine

## 2024-05-12 DIAGNOSIS — Z1231 Encounter for screening mammogram for malignant neoplasm of breast: Secondary | ICD-10-CM

## 2024-05-20 ENCOUNTER — Ambulatory Visit (HOSPITAL_COMMUNITY)
Admission: RE | Admit: 2024-05-20 | Discharge: 2024-05-20 | Disposition: A | Discharge status: Home / Self Care | Payer: Medicare Other | Source: Ambulatory Visit | Attending: Internal Medicine | Admitting: Internal Medicine

## 2024-05-20 DIAGNOSIS — G4733 Obstructive sleep apnea (adult) (pediatric): Secondary | ICD-10-CM | POA: Insufficient documentation

## 2024-05-20 DIAGNOSIS — Z8679 Personal history of other diseases of the circulatory system: Secondary | ICD-10-CM | POA: Diagnosis not present

## 2024-05-20 DIAGNOSIS — I10 Essential (primary) hypertension: Secondary | ICD-10-CM | POA: Diagnosis not present

## 2024-05-20 DIAGNOSIS — I77810 Thoracic aortic ectasia: Secondary | ICD-10-CM | POA: Diagnosis not present

## 2024-05-20 MED ORDER — IOHEXOL 350 MG/ML SOLN
100.0000 mL | Freq: Once | INTRAVENOUS | Status: AC | PRN
  Administered 2024-05-20: 100 mL via INTRAVENOUS

## 2024-05-23 ENCOUNTER — Other Ambulatory Visit: Payer: Self-pay | Admitting: Nurse Practitioner

## 2024-05-25 ENCOUNTER — Telehealth: Payer: Self-pay | Admitting: Internal Medicine

## 2024-05-25 ENCOUNTER — Encounter: Payer: Self-pay | Admitting: Internal Medicine

## 2024-05-25 DIAGNOSIS — I7103 Dissection of thoracoabdominal aorta: Secondary | ICD-10-CM | POA: Diagnosis not present

## 2024-05-25 DIAGNOSIS — I7101 Dissection of ascending aorta: Secondary | ICD-10-CM | POA: Diagnosis not present

## 2024-05-25 DIAGNOSIS — R931 Abnormal findings on diagnostic imaging of heart and coronary circulation: Secondary | ICD-10-CM | POA: Diagnosis not present

## 2024-05-25 DIAGNOSIS — I7772 Dissection of iliac artery: Secondary | ICD-10-CM | POA: Diagnosis not present

## 2024-05-25 DIAGNOSIS — I10 Essential (primary) hypertension: Secondary | ICD-10-CM | POA: Diagnosis not present

## 2024-05-25 DIAGNOSIS — I7121 Aneurysm of the ascending aorta, without rupture: Secondary | ICD-10-CM | POA: Diagnosis not present

## 2024-05-25 DIAGNOSIS — I7779 Dissection of other artery: Secondary | ICD-10-CM | POA: Diagnosis not present

## 2024-05-25 DIAGNOSIS — I71011 Dissection of aortic arch: Secondary | ICD-10-CM | POA: Diagnosis not present

## 2024-05-25 DIAGNOSIS — I7100 Dissection of unspecified site of aorta: Secondary | ICD-10-CM | POA: Diagnosis not present

## 2024-05-25 NOTE — Telephone Encounter (Signed)
 Care reviewed: - new dissection flap with large dissection, with aorta at max diameter 53 mm (in review this may be slightly obliquely measured, does not dissect down to the coronaries with no LAD, RCA, LCX proximal disease and no CAC.) - Patient is asymptomatic - when to [facility]- Repeat scan similar to our scan per patient report; BP has been controlled < 130/80 expect in hospital 1 (was at two [facility]) she then was discharged by her [facility] cardiologists who felt emergent surgery was not indicated. - reviewed her care and discussed red flag symptoms - reached out to [facility] on call, reviewed the CT with them.  Patient does not require emergent surgery and based on her evaluation may not need surgery at this time; [facility] is willing to see her - I have reached out to her original operator - Will reach out to [facility] Nurse Navigator; if by tomorrow her prior surgeon has reach out and is amenable to seeing her will have her f/u with them - If not, as she has no preference, I will have her see our [facility] group  Stanly Leavens, MD FASE [facility] Cardiologist [facility]  [address] [place], [place] [address] [phone]  3:20 PM

## 2024-05-25 NOTE — Telephone Encounter (Signed)
 I received a call from radiology regarding the results of CTA that the patient had on 11/4 to notify me of the following results.   1. New dissection flap originating at the left subclavian artery and extending through the descending thoracic aorta to the visualized upper abdominal aorta. Recommend urgent cardiothoracic/vascular surgery consultation. 2. Progressive dilation of the thoracic aorta: proximal aortic arch 5.3 cm (previously 3.6 cm), distal aortic arch 3.6 cm (previously 3.0 cm), and mid descending thoracic aorta 4.0 cm. 3. Interval aortic root repair with resolution of prior ascending aortic dilation and dissection; ascending aorta now 3.2 cm. 4. Mild cardiomegaly, unchanged.   I tried to call [name] 3 times with no answer. I eventually was able to reach out to her husband who reported that she was sleeping with no heading aids on.   We discussed the results of the CT scan, and the need for further urgent evaluation. She's currently asymptomatic, and they are currently in [place], but they will present to the nearest ED in [place] for further evaluation.

## 2024-05-25 NOTE — H&P (Signed)
 ------------------------------------------------------------------------------- Attestation signed by April Gab, MD at 05/25/24 1302 CARDIOTHORACIC SURGERY ATTESTATION  The patient was seen, examined and discussed with the advanced practice provider.  I provided the substantive portion of medical decision making on this patient for this encounter.  Treatment decisions were made collaboratively with the APP under my direction.    Assessment and Plan: type B dissection status post emergent type a dissection repair in November 2024.  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  Repeat dilation to have a dissection that has now progressed into the bilateral common iliac arteries with contrast flow in both the true and false lumens.  The patient was discharged to follow-up with her cardiologist who ordered a repeat CT scan in 1 year which is scheduled for this coming Tuesday.  She is completely asymptomatic.  We will admit the patient for hypertension control and then discharge her to home to follow-up with a cardiologist.  The patient is a 68 year old female with a past medical history significant for type a aortic dissection in November 2024 in [place] .  She had a CT scan at [facility] health 5 days ago that showed the type B dissection and repeat CT scan in our emergency room showed dissection that began at the previous fenestration, by report, now involves the aortic arch and extends into bilateral common iliac arteries with contrast flow in both the true and false lumen.  She denies any chest or abdominal pain. -------------------------------------------------------------------------------  [facility] CONSULT/INITIAL EVALUATION    Name [name]  DOB 08-20-1955  MRN [id number]  Attending Kayla JULIANNA Alexa Mickey., MD  Treatment Team:  Kayla JULIANNA Alexa Mickey., MD Diana Maria Lizarazo, RN, BSN Damien POUR. [place], RN  Date:  05/25/2024  ASSESSMENT/PLAN  1.  Type B dissection -patient is status post emergent type a dissection repair 05/2023.  A review of the patient's chart shows that she had fenestration of the aortic arch on CT in November 2024.  The report did not indicate an aneurysm nor dissection of the abdominal aorta.  Repeat CT at [facility] showed an extensive dissection that involves a anterior arch and extending into bilateral common iliac arteries.  There is contrast flow in both true and false lumens.  We suspect that this a development from known fenestrated injury of the aortic arch.  We will admit the patient and treat hypertension for goal systolic 120 or less.  We will use esmolol as a first-line agent and Cardene for adjunct if her heart rate was not tolerant of beta-blocker therapy.  History Presenting Illness: Lua Berry is a 68 y.o. female with PMHx of emergent type a dissection repair 05/2023, hypertension, and diverticulosis.  The patient resides in [place]  and visiting friends.  She had a CT chest 5 days ago and called by radiology at [facility] to present to the ED for type B dissection.  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  Repeat CT at [facility] health confirms findings showing an extensive dissection that involves a anterior arch and extending into bilateral common iliac arteries.  There is contrast flow in both true and false lumens.  Patient denies experiencing chest pain and startled from being woken up at approximately 2 AM with an emergent phone call.  Allergies[1] Prior to Admission medications  Medication Sig Start Date  End Date Taking? Authorizing Provider  amLODIPine  besylate (NORVASC ) 2.5 mg tablet Take one tablet (2.5 mg dose) by mouth 2 (two) times daily.    Historical Provider, MD  Aspartame, For Compounding, POWD Take 4 g by  mouth 2 (two) times daily.    Historical Provider, MD  aspirin (ASPIRIN) 81 mg chewable tablet Chew 81 mg by mouth every morning.    Historical Provider, MD  Calcium Carb-Cholecalciferol (CALCIUM 600 + D PO) Take by mouth every morning.    Historical Provider, MD  Calcium Carbonate 1500 (600 Ca) MG TABS Take by mouth daily.    Historical Provider, MD  carvedilol  (COREG ) 25 mg tablet TAKE 1 TABLET(25 MG) BY MOUTH TWICE DAILY WITH A MEAL 04/25/24   Historical Provider, MD  cholecalciferol (VITAMIN D3 SUPER STRENGTH) 50 mcg (2000 UT) CAPS capsule Take by mouth daily.    Historical Provider, MD  cholestyramine (QUESTRAN) 4 g packet SMARTSIG:1 Packet(s) By Mouth 3 Times Daily 04/23/24   Historical Provider, MD  Glucosamine Sulfate 500 MG TABS Take by mouth as needed.    Historical Provider, MD  loratadine (CLARITIN) 10 MG tablet Take 10 mg by mouth daily as needed for Allergies.    Historical Provider, MD  niacin 500 mg tablet Take one tablet (500 mg dose) by mouth daily.    Historical Provider, MD  omeprazole (PRILOSEC) 20 mg capsule Take 20 mg by mouth daily as needed.    Historical Provider, MD  potassium chloride  (K-DUR,KLOR-CON ) 20 mEq CR tablet Take one tablet (20 mEq dose) by mouth 2 (two) times daily.    Historical Provider, MD  ranitidine (ZANTAC) 150 MG tablet Take 150 mg by mouth daily as needed for Heartburn.    Historical Provider, MD  triamterene-hydrochlorothiazide (DYAZIDE) 50-25 MG per capsule Take 1 capsule by mouth every morning.    Historical Provider, MD  triamterene-hydrochlorothiazide (MAXZIDE) 75-50 mg per tablet Take one tablet by mouth daily.    Historical Provider, MD   Social History[2]   ROS: Review of Systems  Constitutional:  Negative for chills and fever.  HENT:  Negative for congestion.   Eyes:  Negative for blurred vision.  Respiratory:  Negative for shortness of breath.   Cardiovascular:  Negative for chest pain.  Gastrointestinal:  Negative for nausea and  vomiting.  Genitourinary:  Negative for dysuria.  Musculoskeletal:  Negative for joint pain.  Skin:  Negative for rash.  Neurological:  Negative for focal weakness.  Psychiatric/Behavioral:  Negative for memory loss.      Physical Exam: Vital Signs:  BP 93/61   Pulse 71   Resp 13   SpO2 97%  Physical Exam Constitutional:      General: She is not in acute distress. HENT:     Head: Normocephalic and atraumatic.     Nose: No congestion.  Eyes:     Conjunctiva/sclera: Conjunctivae normal.     Pupils: Pupils are equal, round, and reactive to light.  Neck:     Vascular: No carotid bruit.  Cardiovascular:     Rate and Rhythm: Normal rate and regular rhythm.     Pulses: Normal pulses.     Heart sounds: Normal heart sounds.  Pulmonary:     Effort: Pulmonary effort is normal.     Breath sounds: Normal breath sounds.  Abdominal:     General: Abdomen is flat.     Palpations: Abdomen is soft.  Musculoskeletal:     Right lower leg: No edema.     Left  lower leg: No edema.  Skin:    General: Skin is warm and dry.  Neurological:     General: No focal deficit present.     Mental Status: She is alert and oriented to person, place, and time.  Psychiatric:        Mood and Affect: Mood normal.      Fluid and electrolyte disorder: N/A, present on admission  Plan: N/A  Abnormal blood counts: N/A, present on admission Plan: Repeat CBC in AM and N/A  Nutritional status: There is no height or weight on file to calculate BMI. N/A Plan: N/A NPO time specified Except: Meds, May have ice chips, May have sips of water  Coagulation defect: N/A, present on admission Plan: N/A  Elevated LFTs/transaminitis due to: N/A, present on admission Plan: N/A  Debility: N/A, present on admission Plan: N/A             [1] Allergies Allergen Reactions  . Vicodin [Hydrocodone -Acetaminophen ] Itching  . Levofloxacin Other    Aortic dissection hx    Other Reaction(s): hx of aortic  dissection  . Losartan  Potassium Other    Other Reaction(s): decreased GFR and hair loss  [2] Social History Socioeconomic History  . Marital status: Married  Tobacco Use  . Smoking status: Never  Vaping Use  . Vaping status: Never Used  Substance and Sexual Activity  . Alcohol use: Yes  . Drug use: Not Currently

## 2024-05-25 NOTE — Progress Notes (Signed)
 Reviewed care with Dr. Burnie in regard to her aortopathy and dissection flat. He shared possible concerns for reintervention and discussed that in absence of symptoms follow up and shared decision-making would be appropriate. His staff will reach out to schedule follow up with her at his [facility] clinic. Reviewed the summation of these discussions with Patient, who is still asymptomatic. At this time, we do not need to schedule her to see our team.  Dr. JAYSON - Phone Dictation.

## 2024-05-25 NOTE — Telephone Encounter (Signed)
     Patient called office to make aware that she was being released from [facility] by CT surgery after further evaluation new dissection in descending aorta on CTA from 05/20/2024. She reports [place] CT surgery noted hat she was stable enough for discharge, did not require emergency intervention, and recommended to follow up with cardiology. Per chart review, patient has history of type a aortic dissection s/p aortic root replacement with freestyle replacement and hemiarch completed at [facility]  on 05/29/2023. Patient denies any CP,  SOB, dizziness, palpitations or syncope. Discussed patient case with DOD, Dr. Alvan who recommended discussing with CT surgery. Discussed patient case with CT surgery, [place], PA-C who recommended discussing with Vascular surgery since descending dissection noted on CT. Discussed patient case with Vascular Surgery, Ahmed Holster, PA-C, who recommended close flow up with [facility] since they did the previous dissection surgery. Informed patient of these recommendation. Discussed the importance of close follow up with [facility] surgeon within 1-2 weeks.  Signed, Lorette CINDERELLA Kapur, PA-C 05/25/2024, 2:39 PM Pager: [phone]

## 2024-05-27 ENCOUNTER — Encounter: Payer: Self-pay | Admitting: Internal Medicine

## 2024-05-27 NOTE — Progress Notes (Signed)
 CARE COORDINATOR CONTACT WITH PATIENT/CAREGIVER AFTER HOSPITAL DISCHARGE   Patient contacted.  Encouraged to schedule Hospital follow up appointment with their Non NH PCP. Patient reports that her PCP is Dr. Alm Rav, chart updated with PCP. Patient reports that she has contacted her cardiologist office since discharge and that she has discharge medications. Patient inquired about her medical records being sent to her providers. Patient was provided the phone number for [facility], [phone] and to ask for medical records.  Melissa D. Verdon, RN, BSN Transitions of Care [facility] [phone]

## 2024-05-28 ENCOUNTER — Telehealth: Payer: Self-pay

## 2024-05-28 NOTE — Telephone Encounter (Signed)
 RN supervisor contacted patient [name] to verify if Dr. Costella team had followed back up with her to get an appointment. She reports they have not, and she called them 2:30PM on Monday and was told no one knew anything about what was going on with her, none of his team had been notified that she needed an appointment. She reports leaving another message at that time asking for urgent callback from his team. RN supervisor asked if patient had an appointment date/time preference and she states she just needs to be seen and doesn't care who by at this point. States she has a pre-planned trip to [facility] next week to visit her children and would like to have a follow-up before then to know what is safe for her to do.   RN supervisor called Dr. Dallas Denise Rana office at [facility] [phone]) and spoke w/ Clarita. Clarita pulled patient info and states there are no messages or records received from us , [facility] hospital, or the patient having called in a few days ago. Clarita states patient got an aortic valve replacement done December 2024 and is scheduled for yearly follow-ups, nothing urgent. Clarita reports she would send a message to Dr. Lanie PA about the need for an appointment and ask if they have received anything.   RN supervisor will ask [facility] team to make appointment for follow-up as patient Ok'd this if there was no forward movement with Dr. Rana office.

## 2024-05-29 DIAGNOSIS — I7122 Aneurysm of the aortic arch, without rupture: Secondary | ICD-10-CM | POA: Diagnosis not present

## 2024-05-30 ENCOUNTER — Other Ambulatory Visit (HOSPITAL_COMMUNITY): Payer: Self-pay

## 2024-05-30 MED ORDER — AMLODIPINE BESYLATE 5 MG PO TABS: 5.0000 mg | ORAL_TABLET | Freq: Two times a day (BID) | ORAL | 6 refills | Status: AC

## 2024-06-02 ENCOUNTER — Encounter: Payer: Self-pay | Admitting: Internal Medicine

## 2024-06-26 DIAGNOSIS — R0789 Other chest pain: Secondary | ICD-10-CM | POA: Diagnosis not present

## 2024-06-26 DIAGNOSIS — M25559 Pain in unspecified hip: Secondary | ICD-10-CM | POA: Diagnosis not present

## 2024-06-27 ENCOUNTER — Other Ambulatory Visit (HOSPITAL_COMMUNITY): Payer: Self-pay

## 2024-06-27 ENCOUNTER — Telehealth: Payer: Self-pay | Admitting: Pharmacy Technician

## 2024-06-27 NOTE — Telephone Encounter (Signed)
 Pharmacy Patient Advocate Encounter  Insurance verification completed.   The patient is insured through [facility]   Ran test claim for amlodipine  5 MG. Currently a quantity of 60 is a 30 day supply and the co-pay is 0.00 . The current 06/27/24 day co-pay is, $00.00.  No PA needed at this time.  This test claim was processed through [facility]- copay amounts may vary at other pharmacies due to pharmacy/plan contracts, or as the patient moves through the different stages of their insurance plan.

## 2024-06-28 ENCOUNTER — Encounter: Payer: Self-pay | Admitting: Internal Medicine

## 2024-07-01 ENCOUNTER — Encounter: Payer: Self-pay | Admitting: Dermatology

## 2024-07-01 ENCOUNTER — Other Ambulatory Visit: Payer: Self-pay

## 2024-07-01 MED ORDER — FLUOROURACIL 5 % EX CREA: TOPICAL_CREAM | Freq: Two times a day (BID) | CUTANEOUS | 0 refills | Status: DC

## 2024-07-01 MED ORDER — FLUOROURACIL 5 % EX CREA: TOPICAL_CREAM | Freq: Two times a day (BID) | CUTANEOUS | 0 refills | Status: AC

## 2024-07-01 NOTE — Progress Notes (Signed)
 Patient requesting larger quantity of compounded 5FU/calcipotriene  mix.

## 2024-07-23 ENCOUNTER — Ambulatory Visit: Attending: Internal Medicine | Admitting: Internal Medicine

## 2024-07-23 VITALS — BP 108/73 | HR 66 | Ht 63.0 in | Wt 155.0 lb

## 2024-07-23 DIAGNOSIS — Z952 Presence of prosthetic heart valve: Secondary | ICD-10-CM

## 2024-07-23 DIAGNOSIS — I10 Essential (primary) hypertension: Secondary | ICD-10-CM

## 2024-07-23 DIAGNOSIS — Z8679 Personal history of other diseases of the circulatory system: Secondary | ICD-10-CM

## 2024-07-23 NOTE — Progress Notes (Signed)
 " Cardiology Office Note:  .    Date:  07/23/2024  ID:  [name], DOB 05/09/1956, MRN [id number] PCP: Seabron Lenis, MD  [facility] Providers Cardiologist:  None     CC: Aortopathy  History of Present Illness: .    [name] is a 70 y.o. female with hypertensive disease who had a dissection s/p Freestyle valve, ascending and arch repair 05/20/24 2025: follow up imaging with worsening dissection flap.   [name] is a 69 year old female with hypertension and aortic dissection who presents for follow-up on her aortopathy.  She has a history of hypertension and heart disease, having undergone aortic resection and aortic valve replacement. In 2025, she had a freestyle valve ascending repair and an arch repair. Recently, she has experienced worsening dilation and a new dissection flap with a large dissection, with an aortic maximum diameter of 53 millimeters. She remains asymptomatic.  Her current medication regimen includes amlodipine  twice a day and carvedilol . She previously experienced an allergy to ARB therapy, which resulted in hair loss and decreased GFR. Her blood pressure occasionally reaches 135 during physical activity. She feels fine overall with no chest pain, breathing issues, or palpitations.  She has a history of obstructive sleep apnea and is being co-managed for this condition. She notes some swelling in her ankles and she is on a diuretic.  She has two sons, and there is a discussion about the potential genetic implications of her aortopathy. Her sons have not been tested for thoracic aortic aneurysm.  She describes experiencing an electrical shock-like sensation in her chest, which has been treated with gabapentin  in the past. Extensive testing has not indicated any cardiac cause.   Relevant histories: .  Social two son, no hx of aortopathy ROS: As per HPI.  Cardiac Studies & Procedures    ______________________________________________________________________________________________     ECHOCARDIOGRAM  ECHOCARDIOGRAM COMPLETE 08/20/2023  Narrative ECHOCARDIOGRAM REPORT    Patient Name:   [name] Date of Exam: 08/20/2023 Medical Rec #:  [id number]        Height:       63.0 in Accession #:    [id number]       Weight:       143.0 lb Date of Birth:  [birth year]        BSA:          1.677 m Patient Age:    67 years         BP:           152/97 mmHg Patient Gender: F                HR:           68 bpm. Exam Location:  [address]  Procedure: 2D Echo, 3D Echo, Cardiac Doppler and Color Doppler  Indications:    Z86.79 History of Aortic Dissection (s/p Repair)  History:        Patient has no prior history of Echocardiogram examinations. Risk Factors:Sleep Apnea, Family History of Coronary Artery Disease, Former Smoker and Hypertension. History of Aortic Dissection status post Repair (06-11-23, Thoracic Aortic Aneurysm and Dissection Repair with Freestyle Valve Placement). Aortic Valve: 25 mm valve is present in the aortic position. Procedure Date: 05/21/2023.  Sonographer:    Heather Hawks RDCS Referring Phys: [facility] [place] Lusty  IMPRESSIONS   1. Left ventricular ejection fraction, by estimation, is 60 to 65%. The left ventricle has normal function. The left ventricle has no  regional wall motion abnormalities. Left ventricular diastolic parameters are consistent with Grade I diastolic dysfunction (impaired relaxation). 2. Right ventricular systolic function is normal. The right ventricular size is normal. There is normal pulmonary artery systolic pressure. The estimated right ventricular systolic pressure is 21.3 mmHg. 3. The mitral valve is grossly normal. Mild mitral valve regurgitation. No evidence of mitral stenosis. 4. 25 stentless freestyle AoV/root replacement 05/21/2023 [facility]). The aortic valve has been repaired/replaced. Aortic valve  regurgitation is not visualized. There is a 25 mm valve present in the aortic position. Procedure Date: 05/21/2023. Echo findings are consistent with normal structure and function of the aortic valve prosthesis. Aortic valve area, by VTI measures 2.20 cm. Aortic valve mean gradient measures 3.0 mmHg. Aortic valve Vmax measures 1.19 m/s. 5. 28 mm aortic graft 05/21/2023. Stable repair and dimensions noted. Aortic root/ascending aorta has been repaired/replaced. 6. The inferior vena cava is normal in size with greater than 50% respiratory variability, suggesting right atrial pressure of 3 mmHg.  FINDINGS Left Ventricle: Left ventricular ejection fraction, by estimation, is 60 to 65%. The left ventricle has normal function. The left ventricle has no regional wall motion abnormalities. The left ventricular internal cavity size was normal in size. There is no left ventricular hypertrophy. Abnormal (paradoxical) septal motion consistent with post-operative status. Left ventricular diastolic parameters are consistent with Grade I diastolic dysfunction (impaired relaxation).  Right Ventricle: The right ventricular size is normal. No increase in right ventricular wall thickness. Right ventricular systolic function is normal. There is normal pulmonary artery systolic pressure. The tricuspid regurgitant velocity is 2.14 m/s, and with an assumed right atrial pressure of 3 mmHg, the estimated right ventricular systolic pressure is 21.3 mmHg.  Left Atrium: Left atrial size was normal in size.  Right Atrium: Right atrial size was normal in size.  Pericardium: Trivial pericardial effusion is present.  Mitral Valve: The mitral valve is grossly normal. Mild mitral valve regurgitation. No evidence of mitral valve stenosis.  Tricuspid Valve: The tricuspid valve is grossly normal. Tricuspid valve regurgitation is mild . No evidence of tricuspid stenosis.  Aortic Valve: 25 stentless freestyle AoV/root replacement  05/21/2023 [facility]). The aortic valve has been repaired/replaced. Aortic valve regurgitation is not visualized. Aortic valve mean gradient measures 3.0 mmHg. Aortic valve peak gradient measures 5.6 mmHg. Aortic valve area, by VTI measures 2.20 cm. There is a 25 mm valve present in the aortic position. Procedure Date: 05/21/2023. Echo findings are consistent with normal structure and function of the aortic valve prosthesis.  Pulmonic Valve: The pulmonic valve was grossly normal. Pulmonic valve regurgitation is not visualized. No evidence of pulmonic stenosis.  Aorta: 28 mm aortic graft 05/21/2023. Stable repair and dimensions noted. The aortic root/ascending aorta has been repaired/replaced.  Venous: The right lower pulmonary vein is normal. The inferior vena cava is normal in size with greater than 50% respiratory variability, suggesting right atrial pressure of 3 mmHg.  IAS/Shunts: The atrial septum is grossly normal.   LEFT VENTRICLE PLAX 2D LVIDd:         4.20 cm   Diastology LVIDs:         2.70 cm   LV e' medial:    6.10 cm/s LV PW:         1.00 cm   LV E/e' medial:  9.6 LV IVS:        1.10 cm   LV e' lateral:   8.92 cm/s LVOT diam:     2.10 cm   LV E/e'  lateral: 6.6 LV SV:         51 LV SV Index:   30 LVOT Area:     3.46 cm  3D Volume EF: 3D EF:        63 % LV EDV:       99 ml LV ESV:       36 ml LV SV:        63 ml  RIGHT VENTRICLE RV Basal diam:  3.60 cm RV S prime:     11.20 cm/s TAPSE (M-mode): 1.8 cm RVSP:           21.3 mmHg  LEFT ATRIUM             Index        RIGHT ATRIUM           Index LA diam:        4.00 cm 2.39 cm/m   RA Pressure: 3.00 mmHg LA Vol (A2C):   54.1 ml 32.27 ml/m  RA Area:     19.20 cm LA Vol (A4C):   42.0 ml 25.05 ml/m  RA Volume:   54.95 ml  32.77 ml/m LA Biplane Vol: 48.4 ml 28.87 ml/m AORTIC VALVE AV Area (Vmax):    2.23 cm AV Area (Vmean):   2.11 cm AV Area (VTI):     2.20 cm AV Vmax:           118.67 cm/s AV Vmean:           78.700 cm/s AV VTI:            0.230 m AV Peak Grad:      5.6 mmHg AV Mean Grad:      3.0 mmHg LVOT Vmax:         76.45 cm/s LVOT Vmean:        47.850 cm/s LVOT VTI:          0.146 m LVOT/AV VTI ratio: 0.64  AORTA Ao Root diam: 3.44 cm Ao Asc diam:  3.20 cm  MITRAL VALVE               TRICUSPID VALVE MV Area (PHT): cm         TR Peak grad:   18.3 mmHg MV Decel Time: 232 msec    TR Vmax:        214.00 cm/s MV E velocity: 58.73 cm/s  Estimated RAP:  3.00 mmHg MV A velocity: 73.90 cm/s  RVSP:           21.3 mmHg MV E/A ratio:  0.79 SHUNTS Systemic VTI:  0.15 m Systemic Diam: 2.10 cm  Darryle Decent MD Electronically signed by Darryle Decent MD Signature Date/Time: 08/21/2023/11:33:11 AM    Final          ______________________________________________________________________________________________       Physical Exam:    VS:  BP 108/73 (BP Location: Right Arm)   Pulse 66   Ht 5' 3 (1.6 m)   Wt 155 lb (70.3 kg)   SpO2 95%   BMI 27.46 kg/m    Wt Readings from Last 3 Encounters:  07/23/24 155 lb (70.3 kg)  12/18/23 144 lb 12.8 oz (65.7 kg)  11/07/23 146 lb 4.8 oz (66.4 kg)    Gen: No distress  Neck: No JVD Cardiac: No Rubs or Gallops, systolic murmur, RRR +2 radial pulses Respiratory: Clear to auscultation bilaterally, normal effort, normal  respiratory rate GI: Soft, nontender, non-distended  MS: No  edema;  moves all  extremities Integument: Skin feels warm Neuro:  At time of evaluation, alert and oriented to person/place/time/situation  Psych: Normal affect, patient feels well   ASSESSMENT AND PLAN: .    Chronic thoracic aortic dissection with aneurysm, status post aortic repair and bioprosthetic valve replacement Chronic thoracic aortic dissection with aneurysm, status post aortic repair and bioprosthetic valve replacement. Aortic max diameter is 53 mm. Asymptomatic. Discussed risks of reoperation, including increased surgical risks due to weakened  tissue. Genetic testing considered for family screening due to potential hereditary component. Discussed potential for elective surgery if aneurysm grows significantly, with risks of reoperation including increased surgical complications due to weakened tissue. - Ordered echocardiogram in February to assess prosthetic valve and aortopathy. - Ordered gated CT scan in late March to evaluate aortic dissection and aneurysm. - Will send CT scan images to Dr. Burnie for interpretation.  Essential hypertension Essential hypertension, currently well-controlled with amlodipine  and carvedilol . Previous issues with ARB therapy due to hair loss and decreased GFR. Blood pressure management is crucial due to aortopathy. Discussed importance of aggressive blood pressure control to prevent further complications. - Continue current antihypertensive regimen with amlodipine  and carvedilol . - Monitor blood pressure regularly.  First degree atrioventricular block First degree atrioventricular block, likely secondary to beta-blocker therapy. Heart rate is in the 50s to 60s, which is acceptable given current medication regimen. - Continue current medication regimen without changes.  Obstructive Sleep Apnea - Continue current management for obstructive sleep apnea  GENETIC TESTING COUNSELING EVALUATION:  I met with the patient today to discuss cardiac genetic testing. 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   TESTING OPTION(S) DISCUSSED: [facility] Aortopathy Panel (29 genes)  BENEFITS OF GENETIC TESTING DISCUSSED:  * May confirm or refine clinical diagnosis related to her dissection, rarely can inform risk of other cardiac disease * May identify at-risk family members who could benefit from screening (specifically her two sons)  LIMITATIONS AND RISKS DISCUSSED:  * Testing may not identify a genetic cause,  even if one exists * Testing may identify a variant of uncertain significance (VUS) * Results may change over time as more information becomes available * Potential for unexpected or secondary findings * Possible psychological impact of positive or negative results  FINANCIAL CONSIDERATIONS DISCUSSED:  * Insurance coverage and potential out-of-pocket costs; data privacy and security (navigated the [employer] website with patient)  LIFE INSURANCE/DISABILITY INSURANCE IMPLICATIONS:  * GINA protection applies to health insurance and employment but not life, disability, or long-term care insurance * Recommendation to consider securing insurance prior to testing if concerned * Documentation considerations for insurance applications  FAMILY IMPLICATIONS: * [facility] testing approach for relatives if positive result, her two sons live in February * Communication strategies with family members * Resources for family discussions  PATIENT UNDERSTANDING AND DECISION:  * Patient demonstrated understanding of the above information * Patient had opportunity to ask questions which were addressed * Patient decision: proceed with testing  FOLLOW-UP PLAN:  * Results disclosure plan: Positive (in person).  VUS or gene elusive result (telehealth with in person follow up if desired)  Time:   I have spent a total of 60 minutes with the patient reviewing notes, imaging, EKGs, labs, and examining the patient as well as establishing an assessment and plan that was discussed personally with the patient. Discussed disease state education and discussing monogenetic testing.  Stanly Leavens, MD FASE [facility] Cardiologist IXL  [facility]  [address], [address] [place], [place] [address] [phone]  11:39 AM   Stanly Leavens, MD FASE [facility] Cardiologist [facility]  [address] [place], [address] [place], [place] [address] [phone]  11:39 AM  "

## 2024-07-23 NOTE — Patient Instructions (Signed)
 Medication Instructions:  Your physician recommends that you continue on your current medications as directed. Please refer to the Current Medication list given to you today.   *If you need a refill on your cardiac medications before your next appointment, please call your pharmacy*  Lab Work: NONE    Testing/Procedures: FEB- - - Your physician has requested that you have an echocardiogram. Echocardiography is a painless test that uses sound waves to create images of your heart. It provides your doctor with information about the size and shape of your heart and how well your hearts chambers and valves are working. This procedure takes approximately one hour. There are no restrictions for this procedure. Please do NOT wear cologne, perfume, aftershave, or lotions (deodorant is allowed). Please arrive 15 minutes prior to your appointment time.  Please note: We ask at that you not bring children with you during ultrasound (echo/ vascular) testing. Due to room size and safety concerns, children are not allowed in the ultrasound rooms during exams. Our front office staff cannot provide observation of children in our lobby area while testing is being conducted. An adult accompanying a patient to their appointment will only be allowed in the ultrasound room at the discretion of the ultrasound technician under special circumstances. We apologize for any inconvenience.   Your physician has requested that you have a Gated CT Aorta in March 2026.  Your physician has requested that you complete Genetic Screening.    Follow-Up: At [facility], you and your health needs are our priority.  As part of our continuing mission to provide you with exceptional heart care, our providers are all part of one team.  This team includes your primary Cardiologist (physician) and Advanced Practice Providers or APPs (Physician Assistants and Nurse Practitioners) who all work together to provide you with the care  you need, when you need it.  Your next appointment:   6 month(s)  Provider:   Stanly Leavens, MD

## 2024-08-20 ENCOUNTER — Ambulatory Visit: Admitting: Internal Medicine

## 2024-08-21 ENCOUNTER — Ambulatory Visit

## 2024-08-21 DIAGNOSIS — Z952 Presence of prosthetic heart valve: Secondary | ICD-10-CM

## 2024-08-21 DIAGNOSIS — Z8679 Personal history of other diseases of the circulatory system: Secondary | ICD-10-CM

## 2024-08-21 DIAGNOSIS — I10 Essential (primary) hypertension: Secondary | ICD-10-CM

## 2024-08-21 LAB — ECHOCARDIOGRAM COMPLETE
AR max vel: 2.03 cm2
AV Area VTI: 2.17 cm2
AV Area mean vel: 2.02 cm2
AV Mean grad: 2 mmHg
AV Peak grad: 4.2 mmHg
Ao pk vel: 1.03 m/s
Area-P 1/2: 3.77 cm2
S' Lateral: 2.8 cm

## 2024-09-17 ENCOUNTER — Encounter

## 2024-09-17 ENCOUNTER — Other Ambulatory Visit: Payer: Self-pay

## 2024-10-08 ENCOUNTER — Encounter

## 2024-10-09 ENCOUNTER — Other Ambulatory Visit (HOSPITAL_COMMUNITY)

## 2024-10-21 ENCOUNTER — Ambulatory Visit: Admitting: Dermatology
